# Patient Record
Sex: Male | Born: 1955 | Race: White | Hispanic: No | Smoking: Former smoker
Health system: Southern US, Community
[De-identification: ages and names within clinical notes are randomized; demographics above are authoritative.]

## PROBLEM LIST (undated history)

## (undated) DIAGNOSIS — Z87442 Personal history of urinary calculi: Secondary | ICD-10-CM

## (undated) DIAGNOSIS — R220 Localized swelling, mass and lump, head: Secondary | ICD-10-CM

## (undated) DIAGNOSIS — E785 Hyperlipidemia, unspecified: Secondary | ICD-10-CM

## (undated) DIAGNOSIS — C099 Malignant neoplasm of tonsil, unspecified: Secondary | ICD-10-CM

## (undated) DIAGNOSIS — I10 Essential (primary) hypertension: Secondary | ICD-10-CM

## (undated) DIAGNOSIS — E119 Type 2 diabetes mellitus without complications: Secondary | ICD-10-CM

## (undated) DIAGNOSIS — N529 Male erectile dysfunction, unspecified: Secondary | ICD-10-CM

## (undated) DIAGNOSIS — E669 Obesity, unspecified: Secondary | ICD-10-CM

## (undated) DIAGNOSIS — K219 Gastro-esophageal reflux disease without esophagitis: Secondary | ICD-10-CM

## (undated) DIAGNOSIS — N200 Calculus of kidney: Secondary | ICD-10-CM

## (undated) HISTORY — DX: Obesity, unspecified: E66.9

## (undated) HISTORY — DX: Male erectile dysfunction, unspecified: N52.9

## (undated) HISTORY — DX: Gastro-esophageal reflux disease without esophagitis: K21.9

## (undated) HISTORY — DX: Localized swelling, mass and lump, head: R22.0

## (undated) HISTORY — DX: Essential (primary) hypertension: I10

## (undated) HISTORY — DX: Personal history of urinary calculi: Z87.442

## (undated) HISTORY — DX: Type 2 diabetes mellitus without complications: E11.9

## (undated) HISTORY — DX: Calculus of kidney: N20.0

## (undated) HISTORY — DX: Hyperlipidemia, unspecified: E78.5

---
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
## 2005-02-09 HISTORY — PX: CHOLECYSTECTOMY: SHX55

## 2005-02-10 ENCOUNTER — Emergency Department (HOSPITAL_COMMUNITY): Admission: EM | Admit: 2005-02-10 | Discharge: 2005-02-10 | Payer: Self-pay | Admitting: Emergency Medicine

## 2008-11-09 HISTORY — PX: COLONOSCOPY: SHX174

## 2008-11-21 ENCOUNTER — Ambulatory Visit: Payer: Self-pay | Admitting: Gastroenterology

## 2008-11-28 ENCOUNTER — Ambulatory Visit: Payer: Self-pay | Admitting: Gastroenterology

## 2008-11-28 ENCOUNTER — Encounter: Payer: Self-pay | Admitting: Gastroenterology

## 2008-11-30 ENCOUNTER — Encounter: Payer: Self-pay | Admitting: Gastroenterology

## 2009-01-18 ENCOUNTER — Emergency Department: Payer: Self-pay | Admitting: Emergency Medicine

## 2009-01-25 ENCOUNTER — Ambulatory Visit: Payer: Self-pay | Admitting: Urology

## 2009-01-29 ENCOUNTER — Ambulatory Visit: Payer: Self-pay | Admitting: Urology

## 2009-01-30 ENCOUNTER — Ambulatory Visit: Payer: Self-pay | Admitting: Urology

## 2009-01-31 ENCOUNTER — Ambulatory Visit: Payer: Self-pay | Admitting: Urology

## 2009-02-14 ENCOUNTER — Ambulatory Visit: Payer: Self-pay | Admitting: Urology

## 2011-02-10 DIAGNOSIS — Z87442 Personal history of urinary calculi: Secondary | ICD-10-CM

## 2011-02-10 HISTORY — PX: LITHOTRIPSY: SUR834

## 2011-02-10 HISTORY — DX: Personal history of urinary calculi: Z87.442

## 2011-11-25 ENCOUNTER — Encounter: Payer: Self-pay | Admitting: Family Medicine

## 2011-11-25 ENCOUNTER — Ambulatory Visit (INDEPENDENT_AMBULATORY_CARE_PROVIDER_SITE_OTHER): Payer: BC Managed Care – PPO | Admitting: Family Medicine

## 2011-11-25 VITALS — BP 130/84 | HR 76 | Temp 98.2°F | Ht 71.5 in | Wt 253.5 lb

## 2011-11-25 DIAGNOSIS — E669 Obesity, unspecified: Secondary | ICD-10-CM

## 2011-11-25 DIAGNOSIS — M722 Plantar fascial fibromatosis: Secondary | ICD-10-CM

## 2011-11-25 DIAGNOSIS — E66811 Obesity, class 1: Secondary | ICD-10-CM | POA: Insufficient documentation

## 2011-11-25 DIAGNOSIS — I10 Essential (primary) hypertension: Secondary | ICD-10-CM

## 2011-11-25 DIAGNOSIS — Z23 Encounter for immunization: Secondary | ICD-10-CM

## 2011-11-25 DIAGNOSIS — L821 Other seborrheic keratosis: Secondary | ICD-10-CM

## 2011-11-25 DIAGNOSIS — Z87442 Personal history of urinary calculi: Secondary | ICD-10-CM

## 2011-11-25 DIAGNOSIS — E785 Hyperlipidemia, unspecified: Secondary | ICD-10-CM

## 2011-11-25 DIAGNOSIS — K219 Gastro-esophageal reflux disease without esophagitis: Secondary | ICD-10-CM

## 2011-11-25 DIAGNOSIS — N529 Male erectile dysfunction, unspecified: Secondary | ICD-10-CM

## 2011-11-25 NOTE — Assessment & Plan Note (Signed)
Reasurred. Monitor for now.

## 2011-11-25 NOTE — Addendum Note (Signed)
Addended by: Josph Macho A on: 11/25/2011 10:28 AM   Modules accepted: Orders

## 2011-11-25 NOTE — Assessment & Plan Note (Addendum)
Check FLP tomorrow - off vytorin.   Will call with plan for chol meds when review upcoming records. Discussed healthier diet for improving chol levels.

## 2011-11-25 NOTE — Progress Notes (Signed)
Subjective:    Patient ID: William Johns, male    DOB: [name] 14, 1956, 56 y.o.   MRN: [id number]  HPI CC: new pt to establish  Has not been seen by prior PCP in 2 yrs.  Would like mole on right abdomen evaluated. Right leg with wart wants evaluated.  Wonders about plantar fasciitis - going on for 1.5 yrs.  Seems to be getting better.  Worse pain with first steps in mornings.  Has had cortisone injection into heel.  Didn't help.  Has not treated with medicines.  Discussed treatment options.  Urologist is Dr. Landis Gandy - [facility] who follows him for kidney stones and ED.  H/o kidney stones - is on hydrocodone for this.  Rarely uses.  HTN - stable and compliant with meds. HLD - chronic.  Was on vytorin but off for 2 months because ran out of meds. Also on fenofibrate 160mg  daily.  Not fasting today.  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  Last blood work was ~2 yrs ago. Colonoscopy 2 yrs ago, states normal . Done by Corinda Gubler Christella Hartigan) 11/2008 - 2 polyps and mild diverticulosis, rec rpt 10 yrs. Prostate exam - none recent.  Discussed - prefers to hold off on screening currently. Flu shot today 11/2011.  Medications and allergies reviewed and updated in chart.  Past histories reviewed and updated if relevant as below. There is no problem list on file for this patient.  Past Medical History  Diagnosis Date  . HTN (hypertension)   . HLD (hyperlipidemia)   . History of kidney stones 2013    s/p lithotripsy  . ED (erectile dysfunction)   . Obesity   . GERD (gastroesophageal reflux disease)    Past Surgical History  Procedure Date  . Cholecystectomy 2007  . Lithotripsy 2013    x 2   History  Substance Use Topics  . Smoking status: Former Smoker    Quit date:  02/10/2007  . Smokeless tobacco: Never Used  . Alcohol Use: Yes     5 drinks/week   Family History  Problem Relation Age of Onset  . Cancer Father 67    lung, smoker  . Aneurysm Mother 48    brain  . Coronary artery disease Neg Hx   . Stroke Neg Hx   . Diabetes Neg Hx    Allergies  Allergen Reactions  . Oxycodone Nausea And Vomiting   Current Outpatient Prescriptions on File Prior to Visit  Medication Sig Dispense Refill  . fenofibrate 160 MG tablet Take 160 mg by mouth daily.      . metoprolol succinate (TOPROL-XL) 50 MG 24 hr tablet Take 25 mg by mouth daily. Take with or immediately following a meal.      . omeprazole (PRILOSEC OTC) 20 MG tablet Take 20 mg by mouth daily.      . sildenafil (VIAGRA) 100 MG tablet Take 50 mg by mouth daily as needed.      . ezetimibe-simvastatin (VYTORIN) 10-20 MG per tablet Take 1 tablet by mouth at bedtime.         Review of Systems  Constitutional: Negative for fever, chills, activity change, appetite change, fatigue and unexpected weight change.  HENT: Negative for hearing loss and neck pain.   Eyes: Negative for visual disturbance.  Respiratory: Negative  for cough, chest tightness, shortness of breath and wheezing.   Cardiovascular: Negative for chest pain (occasional "twinges" at night time), palpitations and leg swelling.  Gastrointestinal: Negative for nausea, vomiting, abdominal pain, diarrhea, constipation, blood in stool and abdominal distention.  Genitourinary: Negative for hematuria and difficulty urinating.  Musculoskeletal: Negative for myalgias and arthralgias.  Skin: Negative for rash.  Neurological: Negative for dizziness, seizures, syncope and headaches.  Hematological: Does not bruise/bleed easily.  Psychiatric/Behavioral: Negative for dysphoric mood. The patient is not nervous/anxious.        Objective:   Physical Exam  Nursing note and vitals reviewed. Constitutional: He is oriented to person, place, and time. He  appears well-developed and well-nourished. No distress.  HENT:  Head: Normocephalic and atraumatic.  Right Ear: Hearing, tympanic membrane, external ear and ear canal normal.  Left Ear: Hearing, tympanic membrane, external ear and ear canal normal.  Nose: Nose normal.  Mouth/Throat: Oropharynx is clear and moist. No oropharyngeal exudate.  Eyes: Conjunctivae normal and EOM are normal. Pupils are equal, round, and reactive to light. No scleral icterus.  Neck: Normal range of motion. Neck supple. No thyromegaly present.  Cardiovascular: Normal rate, regular rhythm, normal heart sounds and intact distal pulses.   No murmur heard. Pulses:      Radial pulses are 2+ on the right side, and 2+ on the left side.  Pulmonary/Chest: Effort normal and breath sounds normal. No respiratory distress. He has no wheezes. He has no rales.  Abdominal: Soft. Bowel sounds are normal. He exhibits no distension and no mass. There is no tenderness. There is no rebound and no guarding.  Musculoskeletal: Normal range of motion. He exhibits no edema.  Lymphadenopathy:    He has no cervical adenopathy.  Neurological: He is alert and oriented to person, place, and time.       CN grossly intact, station and gait intact  Skin: Skin is warm and dry. No rash noted.       Multiple SKs throughout body  Psychiatric: He has a normal mood and affect. His behavior is normal. Judgment and thought content normal.       Assessment & Plan:

## 2011-11-25 NOTE — Assessment & Plan Note (Signed)
Encouraged continued activity to achieve weight loss. Body mass index is 34.86 kg/(m^2).

## 2011-11-25 NOTE — Assessment & Plan Note (Signed)
Chronic, stable. continue toprol xl.

## 2011-11-25 NOTE — Assessment & Plan Note (Signed)
Story consistent with this. Recommended stretching exercises. Heel cushion, frozen water bottle massage. Update if not continuing to improve

## 2011-11-25 NOTE — Patient Instructions (Addendum)
Flu shot today. Tdap today (tetanus and pertussis). Sounds like plantar fasciitis - do stretching exercises provided today. Return in 1 year for physical or as needed. Return tomorrow for blood work. Hold off on vytorin for now - we will see how blood work is tomorrow and give you a call with results.

## 2011-11-25 NOTE — Assessment & Plan Note (Signed)
Chronic. Continue OTC prilosec.

## 2011-11-26 ENCOUNTER — Other Ambulatory Visit (INDEPENDENT_AMBULATORY_CARE_PROVIDER_SITE_OTHER): Payer: BC Managed Care – PPO

## 2011-11-26 DIAGNOSIS — E785 Hyperlipidemia, unspecified: Secondary | ICD-10-CM

## 2011-11-26 DIAGNOSIS — I10 Essential (primary) hypertension: Secondary | ICD-10-CM

## 2011-11-26 LAB — COMPREHENSIVE METABOLIC PANEL
Albumin: 3.9 g/dL (ref 3.5–5.2)
BUN: 20 mg/dL (ref 6–23)
Calcium: 9.7 mg/dL (ref 8.4–10.5)
Chloride: 108 mEq/L (ref 96–112)
Potassium: 4.1 mEq/L (ref 3.5–5.1)
Total Bilirubin: 0.9 mg/dL (ref 0.3–1.2)
Total Protein: 7.2 g/dL (ref 6.0–8.3)

## 2011-11-26 LAB — LIPID PANEL
Cholesterol: 212 mg/dL — ABNORMAL HIGH (ref 0–200)
HDL: 26.1 mg/dL — ABNORMAL LOW (ref >39.00)
Total CHOL/HDL Ratio: 8

## 2011-11-26 LAB — LDL CHOLESTEROL, DIRECT: Direct LDL: 160.6 mg/dL

## 2011-11-26 LAB — TSH: TSH: 1.24 u[IU]/mL (ref 0.35–5.50)

## 2011-11-27 ENCOUNTER — Other Ambulatory Visit: Payer: BC Managed Care – PPO

## 2011-11-29 ENCOUNTER — Other Ambulatory Visit: Payer: Self-pay | Admitting: Family Medicine

## 2011-11-29 DIAGNOSIS — R739 Hyperglycemia, unspecified: Secondary | ICD-10-CM

## 2011-11-29 MED ORDER — ATORVASTATIN CALCIUM 40 MG PO TABS: 40.0000 mg | ORAL_TABLET | Freq: Every day | ORAL | Status: DC

## 2011-12-09 ENCOUNTER — Encounter: Payer: Self-pay | Admitting: Family Medicine

## 2011-12-09 ENCOUNTER — Other Ambulatory Visit (INDEPENDENT_AMBULATORY_CARE_PROVIDER_SITE_OTHER): Payer: BC Managed Care – PPO

## 2011-12-09 DIAGNOSIS — R7309 Other abnormal glucose: Secondary | ICD-10-CM

## 2011-12-09 DIAGNOSIS — E119 Type 2 diabetes mellitus without complications: Secondary | ICD-10-CM

## 2011-12-09 DIAGNOSIS — R7303 Prediabetes: Secondary | ICD-10-CM | POA: Insufficient documentation

## 2011-12-09 DIAGNOSIS — Z8639 Personal history of other endocrine, nutritional and metabolic disease: Secondary | ICD-10-CM | POA: Insufficient documentation

## 2011-12-09 DIAGNOSIS — R739 Hyperglycemia, unspecified: Secondary | ICD-10-CM

## 2011-12-09 DIAGNOSIS — E1169 Type 2 diabetes mellitus with other specified complication: Secondary | ICD-10-CM | POA: Insufficient documentation

## 2011-12-09 LAB — HEMOGLOBIN A1C: Hgb A1c MFr Bld: 6.8 % — ABNORMAL HIGH (ref 4.6–6.5)

## 2011-12-09 LAB — BASIC METABOLIC PANEL
Chloride: 105 mEq/L (ref 96–112)
GFR: 64.63 mL/min (ref >60.00)

## 2011-12-22 ENCOUNTER — Encounter: Payer: Self-pay | Admitting: Family Medicine

## 2011-12-22 ENCOUNTER — Ambulatory Visit (INDEPENDENT_AMBULATORY_CARE_PROVIDER_SITE_OTHER): Payer: BC Managed Care – PPO | Admitting: Family Medicine

## 2011-12-22 VITALS — BP 112/76 | HR 60 | Temp 98.1°F | Wt 250.2 lb

## 2011-12-22 DIAGNOSIS — I10 Essential (primary) hypertension: Secondary | ICD-10-CM

## 2011-12-22 DIAGNOSIS — E119 Type 2 diabetes mellitus without complications: Secondary | ICD-10-CM

## 2011-12-22 LAB — MICROALBUMIN / CREATININE URINE RATIO: Microalb Creat Ratio: 1.4 mg/g (ref 0.0–30.0)

## 2011-12-22 MED ORDER — METOPROLOL SUCCINATE ER 50 MG PO TB24: 25.0000 mg | ORAL_TABLET | Freq: Every day | ORAL | Status: DC

## 2011-12-22 MED ORDER — FENOFIBRATE 160 MG PO TABS: 160.0000 mg | ORAL_TABLET | Freq: Every day | ORAL | Status: DC

## 2011-12-22 MED ORDER — SILDENAFIL CITRATE 100 MG PO TABS: 50.0000 mg | ORAL_TABLET | Freq: Every day | ORAL | Status: DC | PRN

## 2011-12-22 NOTE — Progress Notes (Signed)
  Subjective:    Patient ID: William Johns, male    DOB: 01-02-1956, 56 y.o.   MRN: [id number]  HPI CC: discuss DM  Would like refills of meds including viagra.  DM - has been prediabetic in past.  Never fully diabetic.    Completely changed diet, lost 3 lbs.  Decreasing starched carbs.  Lots of steamed vegetables and chicken.    Vision exam done every 2 yrs.  Last one was 11/2011. Foot exam today.   Past Medical History  Diagnosis Date  . HTN (hypertension)   . HLD (hyperlipidemia)   . History of kidney stones 2013    s/p lithotripsy  . ED (erectile dysfunction)   . Obesity   . GERD (gastroesophageal reflux disease)   . T2DM (type 2 diabetes mellitus)     Review of Systems Per HPI    Objective:   Physical Exam  Nursing note and vitals reviewed. Constitutional: He appears well-developed and well-nourished. No distress.  HENT:  Head: Normocephalic and atraumatic.  Right Ear: External ear normal.  Left Ear: External ear normal.  Nose: Nose normal.  Mouth/Throat: Oropharynx is clear and moist. No oropharyngeal exudate.  Eyes: Conjunctivae normal and EOM are normal. Pupils are equal, round, and reactive to light. No scleral icterus.  Neck: Normal range of motion. Neck supple.  Cardiovascular: Normal rate, regular rhythm, normal heart sounds and intact distal pulses.   No murmur heard. Pulmonary/Chest: Effort normal and breath sounds normal. No respiratory distress. He has no wheezes. He has no rales.  Musculoskeletal: He exhibits no edema.       Diabetic foot exam: Normal inspection No skin breakdown No calluses  Slightly diminished DP/PT pulses Normal sensation to light tough and monofilament Nails normal  Lymphadenopathy:    He has no cervical adenopathy.  Skin: Skin is warm and dry. No rash noted.  Psychiatric: He has a normal mood and affect.       Assessment & Plan:  \

## 2011-12-22 NOTE — Assessment & Plan Note (Signed)
New dx, discussed with pt. Diet controlled currently. Has changed diet and lost weight. anticipate continued good control. Refer for diabetes education, return in 4 mo for recheck blood work (and chol) and DM f/u. Questions answered.

## 2011-12-22 NOTE — Patient Instructions (Addendum)
Return in 4 months for repeat blood work ot check sugars and cholesterol levels and afterwards for visit. Eye check recommended yearly. Keep up the good work with weight loss and diet changes. Call us with questions. Pass by Marion's office to schedule appointment with diabetes education

## 2011-12-23 ENCOUNTER — Encounter: Payer: Self-pay | Admitting: *Deleted

## 2012-01-28 ENCOUNTER — Encounter: Payer: BC Managed Care – PPO | Attending: Family Medicine | Admitting: *Deleted

## 2012-01-28 ENCOUNTER — Encounter: Payer: Self-pay | Admitting: *Deleted

## 2012-01-28 VITALS — Ht 71.0 in | Wt 243.8 lb

## 2012-01-28 DIAGNOSIS — Z713 Dietary counseling and surveillance: Secondary | ICD-10-CM | POA: Insufficient documentation

## 2012-01-28 DIAGNOSIS — E119 Type 2 diabetes mellitus without complications: Secondary | ICD-10-CM | POA: Insufficient documentation

## 2012-01-28 NOTE — Progress Notes (Signed)
HbA1c  6.8%   Patient was seen on 01/28/12 for the first of a series of three diabetes self-management courses at the [facility]. The following learning objectives were met by the patient during this course:   Defines the role of glucose and insulin  Identifies type of diabetes and pathophysiology  Defines the diagnostic criteria for diabetes and prediabetes  States the risk factors for Type 2 Diabetes  States the symptoms of Type 2 Diabetes  Defines Type 2 Diabetes treatment goals  Defines Type 2 Diabetes treatment options  States the rationale for glucose monitoring  Identifies A1C, glucose targets, and testing times  Identifies proper sharps disposal  Defines the purpose of a diabetes food plan  Identifies carbohydrate food groups  Defines effects of carbohydrate foods on glucose levels  Identifies carbohydrate choices/grams/food labels  States benefits of physical activity and effect on glucose  Review of suggested activity guidelines  Handouts given during class include:  Type 2 Diabetes: Basics Book  My Food Plan Book  Follow-Up Plan: Attend core 2 and 3

## 2012-01-28 NOTE — Patient Instructions (Signed)
Goals:  Follow Diabetes Meal Plan as instructed  Eat 3 meals and 2 snacks, every 3-5 hrs  Limit carbohydrate intake to 45-60 grams carbohydrate/meal  Limit carbohydrate intake to 30 grams carbohydrate/snack  Add lean protein foods to meals/snacks  Monitor glucose levels as instructed by your doctor  Aim for 30 mins of physical activity daily  Bring food record and glucose log to your next nutrition visit   

## 2012-02-18 ENCOUNTER — Encounter: Payer: BC Managed Care – PPO | Attending: Family Medicine | Admitting: Dietician

## 2012-02-18 DIAGNOSIS — E119 Type 2 diabetes mellitus without complications: Secondary | ICD-10-CM | POA: Insufficient documentation

## 2012-02-18 DIAGNOSIS — Z713 Dietary counseling and surveillance: Secondary | ICD-10-CM | POA: Insufficient documentation

## 2012-02-18 NOTE — Progress Notes (Signed)
  Patient was seen on 02/18/2012 for the second of a series of three diabetes self-management courses at the [facility]. The following learning objectives were met by the patient during this course:   Explain basic nutrition maintenance and quality assurance  Describe causes, symptoms and treatment of hypoglycemia and hyperglycemia  Explain how to manage diabetes during illness  Describe the importance of good nutrition for health and healthy eating strategies  List strategies to follow meal plan when dining out  Describe the effects of alcohol on glucose and how to use it safely  Describe problem solving skills for day-to-day glucose challenges  Describe strategies to use when treatment plan needs to change  Identify important factors involved in successful weight loss  Describe ways to remain physically active  Describe the impact of regular activity on insulin resistance  RHandouts given in class:  Refrigerator magnet for Sick Day Guidelines  [facility] Oral medication/insulin handout  Weight Loss Handout  Follow-Up Plan: Patient will attend the final class of the ADA Diabetes Self-Care Education.

## 2012-03-03 ENCOUNTER — Encounter: Payer: BC Managed Care – PPO | Admitting: *Deleted

## 2012-03-03 DIAGNOSIS — E119 Type 2 diabetes mellitus without complications: Secondary | ICD-10-CM

## 2012-03-03 NOTE — Progress Notes (Signed)
  Patient was seen on 03/03/12 for the third of a series of three diabetes self-management courses at the [facility]. The following learning objectives were met by the patient during this course:    Describe how diabetes changes over time   Identify diabetes complications and ways to prevent them   Describe strategies that can promote heart health including lowering blood pressure and cholesterol   Describe strategies to lower dietary fat and sodium in the diet   Identify physical activities that benefit cardiovascular health   Evaluate success in meeting personal goal   Describe the belief that they can live successfully with diabetes day to day   Establish 2-3 goals that they will plan to diligently work on until they return for the free 83-month follow-up visit  The following handouts were given in class:  3 Month Follow Up Visit handout  Goal setting handout  Class evaluation form  Your patient has established the following 3 month goals for diabetes self-care:  Reduce fat  Increase physical activity  Follow-Up Plan: Patient will attend a 3 month follow-up visit for diabetes self-management education.

## 2012-03-03 NOTE — Patient Instructions (Signed)
Goals:  Follow Diabetes Meal Plan as instructed  Eat 3 meals and 2 snacks, every 3-5 hrs  Limit carbohydrate intake to 45-60 grams carbohydrate/meal  Limit carbohydrate intake to 30 grams carbohydrate/snack  Add lean protein foods to meals/snacks  Monitor glucose levels as instructed by your doctor  Aim for 30 mins of physical activity daily  Bring food record and glucose log to your next nutrition visit   

## 2012-04-20 ENCOUNTER — Ambulatory Visit (INDEPENDENT_AMBULATORY_CARE_PROVIDER_SITE_OTHER): Payer: BC Managed Care – PPO | Admitting: Family Medicine

## 2012-04-20 ENCOUNTER — Encounter: Payer: Self-pay | Admitting: Family Medicine

## 2012-04-20 VITALS — BP 126/78 | HR 72 | Temp 98.1°F

## 2012-04-20 DIAGNOSIS — I10 Essential (primary) hypertension: Secondary | ICD-10-CM

## 2012-04-20 DIAGNOSIS — E785 Hyperlipidemia, unspecified: Secondary | ICD-10-CM

## 2012-04-20 DIAGNOSIS — E119 Type 2 diabetes mellitus without complications: Secondary | ICD-10-CM

## 2012-04-20 LAB — BASIC METABOLIC PANEL
CO2: 28 mEq/L (ref 19–32)
Calcium: 9.2 mg/dL (ref 8.4–10.5)
Creatinine, Ser: 1.3 mg/dL (ref 0.4–1.5)
GFR: 63.36 mL/min (ref >60.00)
Glucose, Bld: 149 mg/dL — ABNORMAL HIGH (ref 70–99)

## 2012-04-20 LAB — LIPID PANEL: Triglycerides: 103 mg/dL (ref 0.0–149.0)

## 2012-04-20 LAB — HEMOGLOBIN A1C: Hgb A1c MFr Bld: 6.4 % (ref 4.6–6.5)

## 2012-04-20 MED ORDER — ONETOUCH ULTRA SYSTEM W/DEVICE KIT: 1.0000 | PACK | Freq: Once | Status: DC

## 2012-04-20 MED ORDER — GLUCOSE BLOOD VI STRP: ORAL_STRIP | Status: DC

## 2012-04-20 NOTE — Progress Notes (Signed)
  Subjective:    Patient ID: Keyshun Elpers, male    DOB: Dec 21, 1955, 57 y.o.   MRN: [id number]  HPI CC: 4 mo f/u DM  DM - diet controlled.  Recently diagnosed.  Completed DSME at [facility].  Fasting today for blood work.  Watching diet.  Has cut out sodas, cut back on bread, increased steamed vegetables.  Walking, gym twice a week. Lab Results  Component Value Date   HGBA1C 6.8* 12/09/2011   Wt Readings from Last 3 Encounters:  01/28/12 243 lb 12.8 oz (110.587 kg)  12/22/11 250 lb 4 oz (113.513 kg)  11/25/11 253 lb 8 oz (114.987 kg)  scale not working today.  HTN - compliant with toprol xl.  No vision changes, CP/tightness, SOB, leg swelling. Occasional headache. Occasional muscle spasm R chest wall. BP Readings from Last 3 Encounters:  04/20/12 126/78  12/22/11 112/76  11/25/11 130/84    HLD - compliant with lipitor and fenofibrate.  No myalgias.  FLP today.  Past Medical History  Diagnosis Date  . HTN (hypertension)   . HLD (hyperlipidemia)   . History of kidney stones 2013    s/p lithotripsy  . ED (erectile dysfunction)   . Obesity   . GERD (gastroesophageal reflux disease)   . T2DM (type 2 diabetes mellitus)     DSME at [facility] 01/2012     Review of Systems Per HPI    Objective:   Physical Exam  Nursing note and vitals reviewed. Constitutional: He appears well-developed and well-nourished. No distress.  HENT:  Head: Normocephalic and atraumatic.  Mouth/Throat: Oropharynx is clear and moist. No oropharyngeal exudate.  Neck: Normal range of motion. Neck supple. Carotid bruit is not present.  Cardiovascular: Normal rate, regular rhythm, normal heart sounds and intact distal pulses.   No murmur heard. Pulmonary/Chest: Effort normal and breath sounds normal. No respiratory distress. He has no wheezes. He has no rales.  Musculoskeletal: He exhibits no edema.  Lymphadenopathy:    He has no cervical adenopathy.       Assessment & Plan:

## 2012-04-20 NOTE — Assessment & Plan Note (Signed)
Chronic, stable. Good control. Continue med.

## 2012-04-20 NOTE — Patient Instructions (Signed)
Blood work today. Return as needed or in 6 months for follow up. Glucose meter and test strip prescription sent in today - check as needed. Keep up good work with diet changes and exercise. Goal exercise is 30-27min 3-4 times weekly.

## 2012-04-20 NOTE — Assessment & Plan Note (Signed)
Recent diagnosis. Diet controlled as of last check. Discussed healthy diet and exercise. Has completed diabetes education. Prescribed glucose meter today and strips, rec check when feeling ill or as needed.

## 2012-04-20 NOTE — Assessment & Plan Note (Signed)
Chronic. Check FLP today.  

## 2012-04-21 ENCOUNTER — Encounter: Payer: Self-pay | Admitting: *Deleted

## 2012-05-25 ENCOUNTER — Ambulatory Visit: Payer: BC Managed Care – PPO | Admitting: Family Medicine

## 2012-06-30 ENCOUNTER — Ambulatory Visit: Payer: BC Managed Care – PPO | Admitting: *Deleted

## 2012-08-18 ENCOUNTER — Other Ambulatory Visit: Payer: Self-pay

## 2012-11-11 ENCOUNTER — Encounter: Payer: Self-pay | Admitting: Family Medicine

## 2012-11-11 ENCOUNTER — Ambulatory Visit (INDEPENDENT_AMBULATORY_CARE_PROVIDER_SITE_OTHER): Payer: BC Managed Care – PPO | Admitting: Family Medicine

## 2012-11-11 VITALS — BP 124/84 | HR 60 | Temp 98.1°F | Ht 71.0 in | Wt 249.0 lb

## 2012-11-11 DIAGNOSIS — E785 Hyperlipidemia, unspecified: Secondary | ICD-10-CM

## 2012-11-11 DIAGNOSIS — L259 Unspecified contact dermatitis, unspecified cause: Secondary | ICD-10-CM

## 2012-11-11 DIAGNOSIS — Z23 Encounter for immunization: Secondary | ICD-10-CM

## 2012-11-11 DIAGNOSIS — L309 Dermatitis, unspecified: Secondary | ICD-10-CM | POA: Insufficient documentation

## 2012-11-11 DIAGNOSIS — E119 Type 2 diabetes mellitus without complications: Secondary | ICD-10-CM

## 2012-11-11 DIAGNOSIS — I10 Essential (primary) hypertension: Secondary | ICD-10-CM

## 2012-11-11 LAB — LIPID PANEL
Cholesterol: 153 mg/dL (ref 0–200)
Triglycerides: 138 mg/dL (ref <150)
VLDL: 28 mg/dL (ref 0–40)

## 2012-11-11 LAB — BASIC METABOLIC PANEL
Calcium: 9.8 mg/dL (ref 8.4–10.5)
Sodium: 142 mEq/L (ref 135–145)

## 2012-11-11 MED ORDER — CLOBETASOL PROPIONATE 0.05 % EX CREA: TOPICAL_CREAM | Freq: Two times a day (BID) | CUTANEOUS | Status: DC

## 2012-11-11 MED ORDER — LISINOPRIL 5 MG PO TABS: 5.0000 mg | ORAL_TABLET | Freq: Every day | ORAL | Status: DC

## 2012-11-11 NOTE — Assessment & Plan Note (Signed)
Chronic. Check FLP today. Continue lipitor and tricor.

## 2012-11-11 NOTE — Assessment & Plan Note (Signed)
Chronic, stable. Change from toprol xl to lisinopril - discussed taper as per instructions.

## 2012-11-11 NOTE — Addendum Note (Signed)
Addended by: Cindee Lame R on: 11/11/2012 03:47 PM   Modules accepted: Orders

## 2012-11-11 NOTE — Progress Notes (Signed)
  Subjective:    Patient ID: Calub Tarnow, male    DOB: 1955/07/31, 57 y.o.   MRN: [id number]  HPI CC: 6 mo f/u  Mr. Chrisley presents today for 6 mo f/u.  H/o kidney stones - currently battling one.  Requests hydrocodone. Eczema - prior on TCI ointment.  Requests refill of this.  HTN - compliant with metoprolol xl 1/2 tablet daily.  No vision changes, CP/tightness, leg swelling.  Mild dyspnea occasionally.  Mild headaches occasionally  HLD - compliant with lipitor and tricor.  DM - does not check sugars.  Always diet controlled.  Occasional paresthesias.  Pneumovax today.  Foot exam today.  Last eye exam 1 year ago.  No hypoglycemic sxs. Lab Results  Component Value Date   HGBA1C 6.4 04/20/2012   Wt Readings from Last 3 Encounters:  11/11/12 249 lb (112.946 kg)  01/28/12 243 lb 12.8 oz (110.587 kg)  12/22/11 250 lb 4 oz (113.513 kg)  Body mass index is 34.74 kg/(m^2).  Past Medical History  Diagnosis Date  . HTN (hypertension)   . HLD (hyperlipidemia)   . History of kidney stones 2013    s/p lithotripsy  . ED (erectile dysfunction)   . Obesity   . GERD (gastroesophageal reflux disease)   . T2DM (type 2 diabetes mellitus)     DSME at [facility] 01/2012     Review of Systems Per HPI    Objective:   Physical Exam  Nursing note and vitals reviewed. Constitutional: He appears well-developed and well-nourished. No distress.  HENT:  Head: Normocephalic and atraumatic.  Right Ear: External ear normal.  Left Ear: External ear normal.  Nose: Nose normal.  Mouth/Throat: Oropharynx is clear and moist. No oropharyngeal exudate.  Eyes: Conjunctivae and EOM are normal. Pupils are equal, round, and reactive to light. No scleral icterus.  Neck: Normal range of motion. Neck supple.  Cardiovascular: Normal rate, regular rhythm, normal heart sounds and intact distal pulses.   No murmur heard. Pulmonary/Chest: Effort normal and breath sounds normal. No respiratory distress. He has no wheezes.  He has no rales.  Musculoskeletal: He exhibits no edema.  Diabetic foot exam: Normal inspection No skin breakdown calluses great toes Slightly diminished DP/PT pulses Normal sensation to light touch and monofilament Nails normal  Lymphadenopathy:    He has no cervical adenopathy.  Skin: Skin is warm and dry. No rash noted.  Psychiatric: He has a normal mood and affect.       Assessment & Plan:

## 2012-11-11 NOTE — Patient Instructions (Addendum)
Flu shot today. Pneumonia shot today. I've sent in clobetasol cream for eczema.  May use oatmeal bath as well. continue metoprolol xl 1/2 tablet daily until you have about 1 wk's worth, then start taking 1/4 tablet daily (with lisinopril) until you run out.  At that time, start lisinopril 5mg  daily. Blood work today. Return to see me in 6 months for physical or as needed

## 2012-11-11 NOTE — Assessment & Plan Note (Signed)
Diet controlled in past - recheck A1c today.

## 2012-11-11 NOTE — Assessment & Plan Note (Signed)
Treat with high potency steroid cream.  Steroid holiday discussed. rec oatmeal bath prn.

## 2012-11-12 LAB — MICROALBUMIN / CREATININE URINE RATIO: Creatinine, Urine: 166.5 mg/dL

## 2012-12-18 ENCOUNTER — Other Ambulatory Visit: Payer: Self-pay | Admitting: Family Medicine

## 2013-01-21 ENCOUNTER — Other Ambulatory Visit: Payer: Self-pay | Admitting: Family Medicine

## 2013-01-28 ENCOUNTER — Other Ambulatory Visit: Payer: Self-pay | Admitting: Family Medicine

## 2013-03-10 LAB — HM DIABETES EYE EXAM

## 2013-03-22 ENCOUNTER — Encounter: Payer: Self-pay | Admitting: Family Medicine

## 2013-05-12 ENCOUNTER — Encounter: Payer: BC Managed Care – PPO | Admitting: Family Medicine

## 2013-05-15 ENCOUNTER — Encounter: Payer: Self-pay | Admitting: Family Medicine

## 2013-05-15 ENCOUNTER — Ambulatory Visit (INDEPENDENT_AMBULATORY_CARE_PROVIDER_SITE_OTHER): Payer: BC Managed Care – PPO | Admitting: Family Medicine

## 2013-05-15 VITALS — BP 124/70 | HR 70 | Temp 98.2°F | Ht 70.25 in | Wt 251.2 lb

## 2013-05-15 DIAGNOSIS — Z0001 Encounter for general adult medical examination with abnormal findings: Secondary | ICD-10-CM | POA: Insufficient documentation

## 2013-05-15 DIAGNOSIS — I10 Essential (primary) hypertension: Secondary | ICD-10-CM

## 2013-05-15 DIAGNOSIS — E785 Hyperlipidemia, unspecified: Secondary | ICD-10-CM

## 2013-05-15 DIAGNOSIS — E669 Obesity, unspecified: Secondary | ICD-10-CM

## 2013-05-15 DIAGNOSIS — Z0000 Encounter for general adult medical examination without abnormal findings: Secondary | ICD-10-CM | POA: Insufficient documentation

## 2013-05-15 DIAGNOSIS — E119 Type 2 diabetes mellitus without complications: Secondary | ICD-10-CM

## 2013-05-15 DIAGNOSIS — Z87442 Personal history of urinary calculi: Secondary | ICD-10-CM

## 2013-05-15 LAB — LIPID PANEL
Cholesterol: 145 mg/dL (ref 0–200)
HDL: 30.1 mg/dL — ABNORMAL LOW (ref >39.00)
LDL Cholesterol: 91 mg/dL (ref 0–99)
Total CHOL/HDL Ratio: 5
Triglycerides: 118 mg/dL (ref 0.0–149.0)
VLDL: 23.6 mg/dL (ref 0.0–40.0)

## 2013-05-15 LAB — BASIC METABOLIC PANEL
BUN: 13 mg/dL (ref 6–23)
CO2: 26 mEq/L (ref 19–32)
Calcium: 9.1 mg/dL (ref 8.4–10.5)
Chloride: 106 mEq/L (ref 96–112)
Creatinine, Ser: 1.1 mg/dL (ref 0.4–1.5)
GFR: 70.92 mL/min (ref >60.00)
Glucose, Bld: 134 mg/dL — ABNORMAL HIGH (ref 70–99)
POTASSIUM: 4.2 meq/L (ref 3.5–5.1)
SODIUM: 141 meq/L (ref 135–145)

## 2013-05-15 LAB — TSH: TSH: 0.99 u[IU]/mL (ref 0.35–5.50)

## 2013-05-15 LAB — HEMOGLOBIN A1C: Hgb A1c MFr Bld: 6.8 % — ABNORMAL HIGH (ref 4.6–6.5)

## 2013-05-15 MED ORDER — HYDROCODONE-ACETAMINOPHEN 5-325 MG PO TABS: 1.0000 | ORAL_TABLET | Freq: Four times a day (QID) | ORAL | Status: DC | PRN

## 2013-05-15 NOTE — Patient Instructions (Signed)
Blood work today. Good to see you today, call us with questions. Return as needed or in 6 months for recheck.

## 2013-05-15 NOTE — Assessment & Plan Note (Signed)
Preventative protocols reviewed and updated unless pt declined. Discussed healthy diet and lifestyle.  

## 2013-05-15 NOTE — Assessment & Plan Note (Signed)
Chronic, stable. Continue tricor and lipitor.

## 2013-05-15 NOTE — Assessment & Plan Note (Addendum)
Requests pain med prn kidney stone.  Current stone, if does not pass will f/u with urology in [place]. Will prescribe vicodin with sedation precautions.

## 2013-05-15 NOTE — Progress Notes (Signed)
Pre visit review using our clinic review tool, if applicable. No additional management support is needed unless otherwise documented below in the visit note. 

## 2013-05-15 NOTE — Assessment & Plan Note (Signed)
Chronic, stable.  Actually prediabetes last visit. Recheck A1c today.  Continue to encourage compliance with low carb/sugar diet.

## 2013-05-15 NOTE — Assessment & Plan Note (Signed)
Chronic, stable. Continue low dose lisinopril 5mg 

## 2013-05-15 NOTE — Progress Notes (Signed)
BP 124/70  Pulse 70  Temp(Src) 98.2 F (36.8 C) (Oral)  Ht 5' 10.25" (1.784 m)  Wt 251 lb 4 oz (113.966 kg)  BMI 35.81 kg/m2  SpO2 97%   CC: annual exam  Subjective:    Patient ID: William Johns, male    DOB: Sep 16, 1955, 58 y.o.   MRN: [id number]  HPI: William Johns is a 58 y.o. male presenting on 05/15/2013 for Annual Exam   DM - stopped all sodas.  Only drinks occasional diet sodas. Lab Results  Component Value Date   HGBA1C 6.4* 11/11/2012    Wt Readings from Last 3 Encounters:  05/15/13 251 lb 4 oz (113.966 kg)  11/11/12 249 lb (112.946 kg)  01/28/12 243 lb 12.8 oz (110.587 kg)   Body mass index is 35.81 kg/(m^2).  Kidney stone noted for last several months - saw blood in urine weeks ago.  Feeling some R sided pain.  Requests pain med for this.  Urologist in [place] - wants to continue seeing him.  Preventative: Colon cancer screening - 11/2008 2 hyperplastic polyps rec rpt 10 yrs [name]) Prostate cancer screening - discussed - prefers to hold off on screening currently.  No prostate cancer hx.  Nocturia x1-2.  No hesitancy, dribbling or weak stream.  No lower back pain. 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  Allergies and medications reviewed and updated. Current Outpatient Prescriptions on File Prior to Visit  Medication Sig  . aspirin 81 MG tablet Take 81 mg by mouth daily.  Marland Kitchen atorvastatin (LIPITOR) 40 MG tablet TAKE 1 TABLET (40 MG TOTAL) BY MOUTH DAILY.  Marland Kitchen Blood Glucose Monitoring Suppl (ONE TOUCH ULTRA SYSTEM KIT) W/DEVICE KIT 1 kit by Does not apply route once.  . clobetasol cream (TEMOVATE) 0.05 % Apply topically 2 (two) times daily. Apply to  AA  . fenofibrate 160 MG tablet TAKE 1 TABLET BY MOUTH DAILY  . glucose blood (ONE TOUCH ULTRA TEST) test strip Check daily as needed, 250.00  . lisinopril (PRINIVIL,ZESTRIL) 5 MG tablet Take 1 tablet (5 mg total) by mouth daily.  . Multiple Vitamin (MULTIVITAMIN) tablet Take 1 tablet by mouth daily.  Marland Kitchen omeprazole (PRILOSEC OTC) 20 MG tablet Take 20 mg by mouth daily.  . sildenafil (VIAGRA) 100 MG tablet Take 0.5-1 tablets (50-100 mg total) by mouth as needed for erectile dysfunction.   No current facility-administered medications on file prior to visit.    Review of Systems  Constitutional: Positive for fever. Negative for chills, activity change, appetite change, fatigue and unexpected weight change.  HENT: Positive for congestion. Negative for hearing loss.   Eyes: Negative for visual disturbance.  Respiratory: Positive for cough (recent viral uri). Negative for chest tightness, shortness of breath and wheezing.   Cardiovascular: Negative for chest pain, palpitations and leg swelling.  Gastrointestinal: Negative for nausea, vomiting, abdominal pain, diarrhea, constipation, blood in stool and abdominal distention.  Genitourinary: Positive for hematuria. Negative for difficulty urinating.  Musculoskeletal: Negative for arthralgias, myalgias and neck pain.  Skin: Negative for rash.  Neurological: Positive for headaches. Negative for dizziness, seizures and syncope.  Hematological: Negative for adenopathy. Does not bruise/bleed easily.  Psychiatric/Behavioral: Negative for dysphoric mood. The patient is not  nervous/anxious.    Per HPI unless specifically indicated above    Objective:    BP 124/70  Pulse 70  Temp(Src) 98.2 F (36.8 C) (Oral)  Ht 5' 10.25" (1.784 m)  Wt 251 lb 4 oz (113.966 kg)  BMI 35.81 kg/m2  SpO2 97%  Physical Exam  Nursing note and vitals reviewed. Constitutional: He is oriented to person, place, and time. He appears well-developed and well-nourished. No  distress.  HENT:  Head: Normocephalic and atraumatic.  Right Ear: Hearing, tympanic membrane, external ear and ear canal normal.  Left Ear: Hearing, tympanic membrane, external ear and ear canal normal.  Nose: Nose normal.  Mouth/Throat: Uvula is midline, oropharynx is clear and moist and mucous membranes are normal. No oropharyngeal exudate, posterior oropharyngeal edema or posterior oropharyngeal erythema.  Eyes: Conjunctivae and EOM are normal. Pupils are equal, round, and reactive to light. No scleral icterus.  Neck: Normal range of motion. Neck supple. Carotid bruit is not present. No thyromegaly present.  Cardiovascular: Normal rate, regular rhythm, normal heart sounds and intact distal pulses.   No murmur heard. Pulses:      Radial pulses are 2+ on the right side, and 2+ on the left side.  Pulmonary/Chest: Effort normal and breath sounds normal. No respiratory distress. He has no wheezes. He has no rales.  Abdominal: Soft. Bowel sounds are normal. He exhibits no distension and no mass. There is no tenderness. There is no rebound and no guarding.  Musculoskeletal: Normal range of motion. He exhibits no edema.  Diabetic foot exam: Normal inspection No skin breakdown No calluses  Normal DP pulses Normal sensation to light touch and monofilament Nails normal  Lymphadenopathy:    He has no cervical adenopathy.  Neurological: He is alert and oriented to person, place, and time.  CN grossly intact, station and gait intact  Skin: Skin is warm and dry. No rash noted.  Psychiatric: He has a normal mood and affect. His behavior is normal. Judgment and thought content normal.   Results for orders placed in visit on 03/22/13  HM DIABETES EYE EXAM      Result Value Ref Range   HM Diabetic Eye Exam [facility]        Assessment & Plan:   Problem List Items Addressed This Visit   HTN (hypertension)     Chronic, stable. Continue low dose lisinopril 106m     Relevant Orders       Basic metabolic panel      TSH   HLD (hyperlipidemia)     Chronic, stable. Continue tricor and lipitor.    Relevant Orders      Lipid panel   History of kidney stones     Requests pain med prn kidney stone.  Current stone, if does not pass will f/u with urology in [place]. Will prescribe vicodin with sedation precautions.    Obesity     Continue to encourage weight loss through healthy dietary choices and lifestyle.    T2DM (type 2 diabetes mellitus)     Chronic, stable.  Actually prediabetes last visit. Recheck A1c today.  Continue to encourage compliance with low carb/sugar diet.    Relevant Orders      Hemoglobin A1c      HM DIABETES FOOT EXAM (Completed)   Health maintenance examination - Primary     Preventative protocols reviewed and updated unless pt declined. Discussed healthy diet and lifestyle.        Follow up plan: Return  in about 6 months (around 11/14/2013), or as needed, for follow up.

## 2013-05-15 NOTE — Assessment & Plan Note (Signed)
Continue to encourage weight loss through healthy dietary choices and lifestyle.

## 2013-05-16 ENCOUNTER — Telehealth: Payer: Self-pay | Admitting: Family Medicine

## 2013-05-16 NOTE — Telephone Encounter (Signed)
Relevant patient education assigned to patient using Emmi. ° °

## 2013-07-13 ENCOUNTER — Other Ambulatory Visit: Payer: Self-pay | Admitting: Family Medicine

## 2013-11-15 ENCOUNTER — Encounter: Payer: Self-pay | Admitting: Family Medicine

## 2013-11-15 ENCOUNTER — Ambulatory Visit (INDEPENDENT_AMBULATORY_CARE_PROVIDER_SITE_OTHER): Payer: BC Managed Care – PPO | Admitting: Family Medicine

## 2013-11-15 VITALS — BP 132/82 | HR 80 | Temp 98.2°F | Wt 246.8 lb

## 2013-11-15 DIAGNOSIS — Z23 Encounter for immunization: Secondary | ICD-10-CM

## 2013-11-15 DIAGNOSIS — Z87442 Personal history of urinary calculi: Secondary | ICD-10-CM

## 2013-11-15 DIAGNOSIS — L309 Dermatitis, unspecified: Secondary | ICD-10-CM

## 2013-11-15 DIAGNOSIS — E785 Hyperlipidemia, unspecified: Secondary | ICD-10-CM

## 2013-11-15 DIAGNOSIS — E119 Type 2 diabetes mellitus without complications: Secondary | ICD-10-CM

## 2013-11-15 DIAGNOSIS — I10 Essential (primary) hypertension: Secondary | ICD-10-CM

## 2013-11-15 MED ORDER — TRIAMCINOLONE ACETONIDE 0.1 % EX CREA: 1.0000 "application " | TOPICAL_CREAM | Freq: Two times a day (BID) | CUTANEOUS | Status: DC

## 2013-11-15 MED ORDER — METHOCARBAMOL 500 MG PO TABS: 500.0000 mg | ORAL_TABLET | Freq: Three times a day (TID) | ORAL | Status: DC | PRN

## 2013-11-15 NOTE — Progress Notes (Signed)
BP 132/82  Pulse 80  Temp(Src) 98.2 F (36.8 C) (Oral)  Wt 246 lb 12 oz (111.925 kg)   CC: 46mof/u  Subjective:    Patient ID: William Johns male    DOB: 705-02-1955 58y.o.   MRN: [id number] HPI: William Johns a 58y.o. male presenting on 11/15/2013 for Follow-up   DM - regularly does not check sugars.  Compliant with antihyperglycemic regimen which includes: diet controlled.  Denies hypoglycemic symptoms.  Denies paresthesias. Last diabetic eye exam 02/2013.  Pneumovax: 11/2012.  Prevnar: not due. Lab Results  Component Value Date   HGBA1C 6.8* 05/15/2013     HLD - compliant with lipitor and fenofibrate, no myalgias.  HTN - Compliant with current antihypertensive regimen of lisinopril 528mdaily.  Does not check blood pressures at home. At store 120/80. No low blood pressure readings or symptoms of dizziness/syncope. Denies HA, vision changes, CP/tightness, SOB, leg swelling.  Some lower back pain after pulling furniture.  Requests muscle relaxant  Skin rash - dorsal forearms bilaterally comes and goes yearly. Clobetasol cream not effective. Requests different steroid. OTC cortisone works best so far. Does do 1 shower and 1 hot bath every day.  Relevant past medical, surgical, family and social history reviewed and updated as indicated.  Allergies and medications reviewed and updated. Current Outpatient Prescriptions on File Prior to Visit  Medication Sig  . aspirin 81 MG tablet Take 81 mg by mouth daily.  . Marland Kitchentorvastatin (LIPITOR) 40 MG tablet TAKE 1 TABLET (40 MG TOTAL) BY MOUTH DAILY.  . Marland Kitchenlood Glucose Monitoring Suppl (ONE TOUCH ULTRA SYSTEM KIT) W/DEVICE KIT 1 kit by Does not apply route once.  . fenofibrate 160 MG tablet TAKE 1 TABLET BY MOUTH DAILY  . glucose blood (ONE TOUCH ULTRA TEST) test strip Check daily as needed, 250.00  . HYDROcodone-acetaminophen (NORCO) 5-325 MG per tablet Take 1 tablet by mouth every 6 (six) hours as needed for moderate pain.  . Marland Kitchenisinopril  (PRINIVIL,ZESTRIL) 5 MG tablet TAKE 1 TABLET (5 MG TOTAL) BY MOUTH DAILY.  . Multiple Vitamin (MULTIVITAMIN) tablet Take 1 tablet by mouth daily.  . Marland Kitchenmeprazole (PRILOSEC OTC) 20 MG tablet Take 20 mg by mouth daily.  . sildenafil (VIAGRA) 100 MG tablet Take 0.5-1 tablets (50-100 mg total) by mouth as needed for erectile dysfunction.   No current facility-administered medications on file prior to visit.    Review of Systems Per HPI unless specifically indicated above    Objective:    BP 132/82  Pulse 80  Temp(Src) 98.2 F (36.8 C) (Oral)  Wt 246 lb 12 oz (111.925 kg)  Physical Exam  Nursing note and vitals reviewed. Constitutional: He appears well-developed and well-nourished. No distress.  HENT:  Head: Normocephalic and atraumatic.  Right Ear: External ear normal.  Left Ear: External ear normal.  Nose: Nose normal.  Mouth/Throat: Oropharynx is clear and moist. No oropharyngeal exudate.  Eyes: Conjunctivae and EOM are normal. Pupils are equal, round, and reactive to light. No scleral icterus.  Neck: Normal range of motion. Neck supple.  Cardiovascular: Normal rate, regular rhythm, normal heart sounds and intact distal pulses.   No murmur heard. Pulmonary/Chest: Effort normal and breath sounds normal. No respiratory distress. He has no wheezes. He has no rales.  Musculoskeletal: He exhibits no edema.  Diabetic foot exam: Normal inspection Some maceration between 4th/5th digits on left Calluses bilateral great toes R>L Normal DP/PT pulses Normal sensation to light touch and monofilament Nails normal  Lymphadenopathy:    He has no cervical adenopathy.  Skin: Skin is warm and dry. Rash noted.  Bilateral dorsal forearms with dry skin with small erosion and excoriations, some lichenification present  Psychiatric: He has a normal mood and affect.   Results for orders placed in visit on 05/15/13  LIPID PANEL      Result Value Ref Range   Cholesterol 145  0 - 200 mg/dL    Triglycerides 118.0  0.0 - 149.0 mg/dL   HDL 30.10 (*) >39.00 mg/dL   VLDL 23.6  0.0 - 40.0 mg/dL   LDL Cholesterol 91  0 - 99 mg/dL   Total CHOL/HDL Ratio 5    BASIC METABOLIC PANEL      Result Value Ref Range   Sodium 141  135 - 145 mEq/L   Potassium 4.2  3.5 - 5.1 mEq/L   Chloride 106  96 - 112 mEq/L   CO2 26  19 - 32 mEq/L   Glucose, Bld 134 (*) 70 - 99 mg/dL   BUN 13  6 - 23 mg/dL   Creatinine, Ser 1.1  0.4 - 1.5 mg/dL   Calcium 9.1  8.4 - 10.5 mg/dL   GFR 70.92  >60.00 mL/min  TSH      Result Value Ref Range   TSH 0.99  0.35 - 5.50 uIU/mL  HEMOGLOBIN A1C      Result Value Ref Range   Hemoglobin A1C 6.8 (*) 4.6 - 6.5 %      Assessment & Plan:   Problem List Items Addressed This Visit   T2DM (type 2 diabetes mellitus) - Primary     Chronic, stable. Diet controlled diabetes. Recheck A1c. Foot exam today.    Relevant Orders      HM DIABETES FOOT EXAM (Completed)      Hemoglobin A1c   HTN (hypertension)     Chronic, stable. Continue low dose lisinopril.    HLD (hyperlipidemia)      Chronic, stable. Continue tricor and lipitor. FLP next visit. Lab Results  Component Value Date   LDLCALC 91 05/15/2013      History of kidney stones     Brings bottle with #38 pills, #40 prescribed 05/2013.    Eczema     Exam still consistent with eczema - trial of TCI cream. Discussed combining with vaseline, discussed avoiding hot shower/baths.         Follow up plan: Return in about 6 months (around 05/17/2014), or as needed, for physical.

## 2013-11-15 NOTE — Assessment & Plan Note (Signed)
Chronic, stable. Continue tricor and lipitor. FLP next visit. Lab Results  Component Value Date   LDLCALC 91 05/15/2013

## 2013-11-15 NOTE — Assessment & Plan Note (Signed)
Chronic, stable. Diet controlled diabetes. Recheck A1c. Foot exam today.

## 2013-11-15 NOTE — Progress Notes (Signed)
Pre visit review using our clinic review tool, if applicable. No additional management support is needed unless otherwise documented below in the visit note. 

## 2013-11-15 NOTE — Addendum Note (Signed)
Addended by: Royann Shivers A on: 11/15/2013 09:21 AM   Modules accepted: Orders

## 2013-11-15 NOTE — Patient Instructions (Addendum)
Flu shot today. Decrease hot shower/bath if able. New steroid cream sent to pharmacy. Try robaxin muscle relaxant sent to pharmacy. You are doing well today. Return in 6 months for physical. Blood work today.

## 2013-11-15 NOTE — Assessment & Plan Note (Signed)
Exam still consistent with eczema - trial of TCI cream. Discussed combining with vaseline, discussed avoiding hot shower/baths.

## 2013-11-15 NOTE — Assessment & Plan Note (Signed)
Chronic, stable. Continue low dose lisinopril  

## 2013-11-15 NOTE — Assessment & Plan Note (Signed)
Brings bottle with #38 pills, #40 prescribed 05/2013.

## 2013-11-16 ENCOUNTER — Other Ambulatory Visit (INDEPENDENT_AMBULATORY_CARE_PROVIDER_SITE_OTHER): Payer: BC Managed Care – PPO

## 2013-11-16 DIAGNOSIS — E119 Type 2 diabetes mellitus without complications: Secondary | ICD-10-CM

## 2013-11-16 LAB — HEMOGLOBIN A1C: Hgb A1c MFr Bld: 6.1 % (ref 4.6–6.5)

## 2013-12-20 ENCOUNTER — Other Ambulatory Visit: Payer: Self-pay | Admitting: Family Medicine

## 2014-01-24 ENCOUNTER — Other Ambulatory Visit: Payer: Self-pay | Admitting: Family Medicine

## 2014-01-26 ENCOUNTER — Emergency Department (HOSPITAL_COMMUNITY)
Admission: EM | Admit: 2014-01-26 | Discharge: 2014-01-26 | Disposition: A | Discharge status: Home / Self Care | Payer: BC Managed Care – PPO | Attending: Emergency Medicine | Admitting: Emergency Medicine

## 2014-01-26 ENCOUNTER — Emergency Department (HOSPITAL_COMMUNITY): Payer: BC Managed Care – PPO

## 2014-01-26 ENCOUNTER — Encounter (HOSPITAL_COMMUNITY): Payer: Self-pay | Admitting: Emergency Medicine

## 2014-01-26 DIAGNOSIS — N529 Male erectile dysfunction, unspecified: Secondary | ICD-10-CM | POA: Insufficient documentation

## 2014-01-26 DIAGNOSIS — K219 Gastro-esophageal reflux disease without esophagitis: Secondary | ICD-10-CM | POA: Insufficient documentation

## 2014-01-26 DIAGNOSIS — N132 Hydronephrosis with renal and ureteral calculous obstruction: Secondary | ICD-10-CM | POA: Insufficient documentation

## 2014-01-26 DIAGNOSIS — Z7982 Long term (current) use of aspirin: Secondary | ICD-10-CM | POA: Insufficient documentation

## 2014-01-26 DIAGNOSIS — Z87891 Personal history of nicotine dependence: Secondary | ICD-10-CM | POA: Insufficient documentation

## 2014-01-26 DIAGNOSIS — Z79899 Other long term (current) drug therapy: Secondary | ICD-10-CM | POA: Diagnosis not present

## 2014-01-26 DIAGNOSIS — E785 Hyperlipidemia, unspecified: Secondary | ICD-10-CM | POA: Insufficient documentation

## 2014-01-26 DIAGNOSIS — R109 Unspecified abdominal pain: Secondary | ICD-10-CM

## 2014-01-26 DIAGNOSIS — E119 Type 2 diabetes mellitus without complications: Secondary | ICD-10-CM | POA: Diagnosis not present

## 2014-01-26 DIAGNOSIS — E669 Obesity, unspecified: Secondary | ICD-10-CM | POA: Insufficient documentation

## 2014-01-26 DIAGNOSIS — I10 Essential (primary) hypertension: Secondary | ICD-10-CM | POA: Insufficient documentation

## 2014-01-26 DIAGNOSIS — N201 Calculus of ureter: Secondary | ICD-10-CM

## 2014-01-26 LAB — CBC WITH DIFFERENTIAL/PLATELET
Basophils Absolute: 0 10*3/uL (ref 0.0–0.1)
Basophils Relative: 0 % (ref 0–1)
Eosinophils Absolute: 0.1 10*3/uL (ref 0.0–0.7)
Eosinophils Relative: 1 % (ref 0–5)
HCT: 43.5 % (ref 39.0–52.0)
Hemoglobin: 15 g/dL (ref 13.0–17.0)
LYMPHS ABS: 1.5 10*3/uL (ref 0.7–4.0)
LYMPHS PCT: 14 % (ref 12–46)
MCH: 30.7 pg (ref 26.0–34.0)
MCHC: 34.5 g/dL (ref 30.0–36.0)
MCV: 89.1 fL (ref 78.0–100.0)
Monocytes Absolute: 0.6 10*3/uL (ref 0.1–1.0)
Monocytes Relative: 6 % (ref 3–12)
NEUTROS PCT: 79 % — AB (ref 43–77)
Neutro Abs: 8.4 10*3/uL — ABNORMAL HIGH (ref 1.7–7.7)
PLATELETS: 166 10*3/uL (ref 150–400)
RBC: 4.88 MIL/uL (ref 4.22–5.81)
RDW: 12.7 % (ref 11.5–15.5)
WBC: 10.7 10*3/uL — AB (ref 4.0–10.5)

## 2014-01-26 LAB — URINALYSIS, ROUTINE W REFLEX MICROSCOPIC
Bilirubin Urine: NEGATIVE
Glucose, UA: NEGATIVE mg/dL
Ketones, ur: NEGATIVE mg/dL
NITRITE: NEGATIVE
PROTEIN: NEGATIVE mg/dL
Specific Gravity, Urine: 1.016 (ref 1.005–1.030)
UROBILINOGEN UA: 1 mg/dL (ref 0.0–1.0)
pH: 7 (ref 5.0–8.0)

## 2014-01-26 LAB — BASIC METABOLIC PANEL
Anion gap: 12 (ref 5–15)
BUN: 27 mg/dL — ABNORMAL HIGH (ref 6–23)
CO2: 26 mEq/L (ref 19–32)
Calcium: 10.3 mg/dL (ref 8.4–10.5)
Chloride: 107 mEq/L (ref 96–112)
Creatinine, Ser: 1.51 mg/dL — ABNORMAL HIGH (ref 0.50–1.35)
GFR calc Af Amer: 57 mL/min — ABNORMAL LOW (ref >90)
GFR, EST NON AFRICAN AMERICAN: 49 mL/min — AB (ref >90)
Glucose, Bld: 173 mg/dL — ABNORMAL HIGH (ref 70–99)
POTASSIUM: 4.2 meq/L (ref 3.7–5.3)
SODIUM: 145 meq/L (ref 137–147)

## 2014-01-26 LAB — URINE MICROSCOPIC-ADD ON

## 2014-01-26 MED ORDER — IBUPROFEN 600 MG PO TABS: 600.0000 mg | ORAL_TABLET | Freq: Four times a day (QID) | ORAL | Status: DC | PRN

## 2014-01-26 MED ORDER — KETOROLAC TROMETHAMINE 30 MG/ML IJ SOLN
30.0000 mg | Freq: Once | INTRAMUSCULAR | Status: AC
  Administered 2014-01-26: 30 mg via INTRAVENOUS
  Filled 2014-01-26: qty 1

## 2014-01-26 MED ORDER — ONDANSETRON 8 MG PO TBDP: 8.0000 mg | ORAL_TABLET | Freq: Three times a day (TID) | ORAL | Status: DC | PRN

## 2014-01-26 MED ORDER — ONDANSETRON HCL 4 MG/2ML IJ SOLN: 4.0000 mg | Freq: Four times a day (QID) | INTRAMUSCULAR | Status: DC | PRN

## 2014-01-26 MED ORDER — FENTANYL CITRATE 0.05 MG/ML IJ SOLN
50.0000 ug | INTRAMUSCULAR | Status: DC | PRN
Start: 2014-01-26 — End: 2014-01-26

## 2014-01-26 MED ORDER — ONDANSETRON HCL 4 MG/2ML IJ SOLN
4.0000 mg | Freq: Once | INTRAMUSCULAR | Status: AC
  Administered 2014-01-26: 4 mg via INTRAVENOUS
  Filled 2014-01-26: qty 2

## 2014-01-26 MED ORDER — HYDROCODONE-ACETAMINOPHEN 5-325 MG PO TABS: 1.0000 | ORAL_TABLET | Freq: Four times a day (QID) | ORAL | Status: DC | PRN

## 2014-01-26 NOTE — Discharge Instructions (Signed)
You have a kidney stone. Likely to pass on it's own - but if there is fevers, or severe pain not responding to pain meds, come back to [place] [facility].   Ureteral Colic (Kidney Stones) Ureteral colic is the result of a condition when kidney stones form inside the kidney. Once kidney stones are formed they may move into the tube that connects the kidney with the bladder (ureter). If this occurs, this condition may cause pain (colic) in the ureter.  CAUSES  Pain is caused by stone movement in the ureter and the obstruction caused by the stone. SYMPTOMS  The pain comes and goes as the ureter contracts around the stone. The pain is usually intense, sharp, and stabbing in character. The location of the pain may move as the stone moves through the ureter. When the stone is near the kidney the pain is usually located in the back and radiates to the belly (abdomen). When the stone is ready to pass into the bladder the pain is often located in the lower abdomen on the side the stone is located. At this location, the symptoms may mimic those of a urinary tract infection with urinary frequency. Once the stone is located here it often passes into the bladder and the pain disappears completely. TREATMENT   Your caregiver will provide you with medicine for pain relief.  You may require specialized follow-up X-rays.  The absence of pain does not always mean that the stone has passed. It may have just stopped moving. If the urine remains completely obstructed, it can cause loss of kidney function or even complete destruction of the involved kidney. It is your responsibility and in your interest that X-rays and follow-ups as suggested by your caregiver are completed. Relief of pain without passage of the stone can be associated with severe damage to the kidney, including loss of kidney function on that side.  If your stone does not pass on its own, additional measures may be taken by your caregiver to ensure its  removal. HOME CARE INSTRUCTIONS   Increase your fluid intake. Water is the preferred fluid since juices containing vitamin C may acidify the urine making it less likely for certain stones (uric acid stones) to pass.  Strain all urine. A strainer will be provided. Keep all particulate matter or stones for your caregiver to inspect.  Take your pain medicine as directed.  Make a follow-up appointment with your caregiver as directed.  Remember that the goal is passage of your stone. The absence of pain does not mean the stone is gone. Follow your caregiver's instructions.  Only take over-the-counter or prescription medicines for pain, discomfort, or fever as directed by your caregiver. SEEK MEDICAL CARE IF:   Pain cannot be controlled with the prescribed medicine.  You have a fever.  Pain continues for longer than your caregiver advises it should.  There is a change in the pain, and you develop chest discomfort or constant abdominal pain.  You feel faint or pass out. MAKE SURE YOU:   Understand these instructions.  Will watch your condition.  Will get help right away if you are not doing well or get worse. Document Released: 11/05/2004 Document Revised: 05/23/2012 Document Reviewed: 07/23/2010 [facility] Patient Information 2015 [place], [place]. This information is not intended to replace advice given to you by your health care provider. Make sure you discuss any questions you have with your health care provider.

## 2014-01-26 NOTE — ED Notes (Signed)
MD at bedside. 

## 2014-01-26 NOTE — ED Notes (Signed)
Patient resting on stretcher states he is feeling much better

## 2014-01-26 NOTE — ED Notes (Signed)
Patient reports right flank pain x3 days but worse tonight. Reports nausea and 1 episode of emesis. Pain 10/10

## 2014-01-26 NOTE — ED Notes (Signed)
RN went in to d/c pt IV, pt already taken IV out. No bleeding noted. Catheter still intact to IV. NAD.

## 2014-01-28 NOTE — ED Provider Notes (Signed)
CSN: [id number]     Arrival date & time 01/26/14  0501 History   First MD Initiated Contact with Patient 01/26/14 [phone]     Chief Complaint  Patient presents with  . Flank Pain     (Consider location/radiation/quality/duration/timing/severity/associated sxs/prior Treatment) HPI Comments: Pt has hx of renal stones, 3 years ago, and his current pain is exacetly the same. Pain is R flank. No uti like sx.  Patient is a 58 y.o. male presenting with flank pain. The history is provided by the patient.  Flank Pain This is a new problem. The current episode started 1 to 2 hours ago. The problem occurs constantly. The problem has been gradually worsening. Associated symptoms include abdominal pain. Pertinent negatives include no chest pain and no shortness of breath. Nothing aggravates the symptoms. Nothing relieves the symptoms. He has tried acetaminophen for the symptoms.    Past Medical History  Diagnosis Date  . HTN (hypertension)   . HLD (hyperlipidemia)   . History of kidney stones 2013    s/p lithotripsy  . ED (erectile dysfunction)   . Obesity   . GERD (gastroesophageal reflux disease)   . T2DM (type 2 diabetes mellitus)     DSME at [facility] 01/2012   Past Surgical History  Procedure Laterality Date  . Cholecystectomy  2007  . Lithotripsy  2013    x 2  . Colonoscopy  11/2008    2 hyperplastic polyps [name])   Family History  Problem Relation Age of Onset  . Cancer Father 76    lung, smoker  . Aneurysm Mother 97    brain  . Coronary artery disease Neg Hx   . Stroke Neg Hx   . Diabetes Neg Hx    History  Substance Use Topics  . Smoking status: Former Smoker    Quit date: 02/10/2007  . Smokeless tobacco: Never Used  . Alcohol Use: Yes     Comment: 5 drinks/week    Review of Systems  Constitutional: Negative for activity change and appetite change.  Respiratory: Negative for cough and shortness of breath.   Cardiovascular: Negative for chest pain.  Gastrointestinal:  Positive for nausea, vomiting and abdominal pain.  Genitourinary: Positive for flank pain. Negative for dysuria.      Allergies  Oxycodone  Home Medications   Prior to Admission medications   Medication Sig Start Date End Date Taking? Authorizing Provider  aspirin 81 MG tablet Take 81 mg by mouth daily.   Yes Historical Provider, MD  atorvastatin (LIPITOR) 40 MG tablet Take 40 mg by mouth daily.   Yes Historical Provider, MD  fenofibrate 160 MG tablet Take 160 mg by mouth daily.   Yes Historical Provider, MD  HYDROcodone-acetaminophen (NORCO) 5-325 MG per tablet Take 1 tablet by mouth every 6 (six) hours as needed for moderate pain. 05/15/13  Yes Ria Bush, MD  lisinopril (PRINIVIL,ZESTRIL) 5 MG tablet Take 5 mg by mouth daily.   Yes Historical Provider, MD  Multiple Vitamin (MULTIVITAMIN) tablet Take 1 tablet by mouth daily.   Yes Historical Provider, MD  omeprazole (PRILOSEC OTC) 20 MG tablet Take 20 mg by mouth daily.   Yes Historical Provider, MD  sildenafil (VIAGRA) 100 MG tablet Take 0.5-1 tablets (50-100 mg total) by mouth as needed for erectile dysfunction. 01/28/13  Yes Ria Bush, MD  triamcinolone cream (KENALOG) 0.1 % Apply 1 application topically 2 (two) times daily. Apply to [place]. 11/15/13  Yes Ria Bush, MD  atorvastatin (LIPITOR) 40 MG tablet TAKE  1 TABLET (40 MG TOTAL) BY MOUTH DAILY. 12/18/12   Ria Bush, MD  Blood Glucose Monitoring Suppl (ONE TOUCH ULTRA SYSTEM KIT) W/DEVICE KIT 1 kit by Does not apply route once. 04/20/12   Ria Bush, MD  fenofibrate 160 MG tablet TAKE 1 TABLET BY MOUTH DAILY 01/24/14   Ria Bush, MD  glucose blood (ONE TOUCH ULTRA TEST) test strip Check daily as needed, 250.00 04/20/12   Ria Bush, MD  HYDROcodone-acetaminophen (NORCO/VICODIN) 5-325 MG per tablet Take 1 tablet by mouth every 6 (six) hours as needed. 01/26/14   Varney Biles, MD  ibuprofen (ADVIL,MOTRIN) 600 MG tablet Take 1 tablet (600 mg  total) by mouth every 6 (six) hours as needed. 01/26/14   Varney Biles, MD  lisinopril (PRINIVIL,ZESTRIL) 5 MG tablet TAKE 1 TABLET (5 MG TOTAL) BY MOUTH DAILY. 12/20/13   Ria Bush, MD  methocarbamol (ROBAXIN) 500 MG tablet Take 1 tablet (500 mg total) by mouth 3 (three) times daily as needed for muscle spasms (sedation precautions). Patient not taking: Reported on 01/26/2014 11/15/13   Ria Bush, MD  ondansetron (ZOFRAN ODT) 8 MG disintegrating tablet Take 1 tablet (8 mg total) by mouth every 8 (eight) hours as needed for nausea. 01/26/14   Ankit Nanavati, MD   BP 160/98 mmHg  Pulse 66  Temp(Src) 98.2 F (36.8 C) (Oral)  Resp 16  SpO2 98% Physical Exam  Constitutional: He is oriented to person, place, and time. He appears well-developed.  HENT:  Head: Normocephalic and atraumatic.  Eyes: Conjunctivae and EOM are normal. Pupils are equal, round, and reactive to light.  Neck: Normal range of motion. Neck supple.  Cardiovascular: Normal rate and regular rhythm.   Pulmonary/Chest: Effort normal and breath sounds normal.  Abdominal: Soft. Bowel sounds are normal. He exhibits no distension. There is no tenderness. There is no rebound and no guarding.  Neurological: He is alert and oriented to person, place, and time.  Skin: Skin is warm.  Nursing note and vitals reviewed.   ED Course  Procedures (including critical care time) Labs Review Labs Reviewed  CBC WITH DIFFERENTIAL - Abnormal; Notable for the following:    WBC 10.7 (*)    Neutrophils Relative % 79 (*)    Neutro Abs 8.4 (*)    All other components within normal limits  BASIC METABOLIC PANEL - Abnormal; Notable for the following:    Glucose, Bld 173 (*)    BUN 27 (*)    Creatinine, Ser 1.51 (*)    GFR calc non Af Amer 49 (*)    GFR calc Af Amer 57 (*)    All other components within normal limits  URINALYSIS, ROUTINE W REFLEX MICROSCOPIC - Abnormal; Notable for the following:    APPearance TURBID (*)     Hgb urine dipstick LARGE (*)    Leukocytes, UA SMALL (*)    All other components within normal limits  URINE MICROSCOPIC-ADD ON - Abnormal; Notable for the following:    Bacteria, UA FEW (*)    All other components within normal limits    Imaging Review No results found.   EKG Interpretation None      MDM   Final diagnoses:  Right lateral abdominal pain  Hydronephrosis with urinary obstruction due to ureteral calculus  Ureterolithiasis    Pt comes in with cc of flank pain, nausea, emesis. Sudden onset. Hx of renal stones, and current sx consistent with that. IV toradol given, and adequate analgesia achieved, with [place] confirming hydronephrosis. Stable  for d/c. Return precautions discussed.    Varney Biles, MD 01/28/14 [phone]

## 2014-02-04 ENCOUNTER — Other Ambulatory Visit: Payer: Self-pay | Admitting: Family Medicine

## 2014-05-07 ENCOUNTER — Other Ambulatory Visit: Payer: Self-pay | Admitting: Family Medicine

## 2014-05-13 ENCOUNTER — Other Ambulatory Visit: Payer: Self-pay | Admitting: Family Medicine

## 2014-05-13 DIAGNOSIS — Z125 Encounter for screening for malignant neoplasm of prostate: Secondary | ICD-10-CM

## 2014-05-13 DIAGNOSIS — I10 Essential (primary) hypertension: Secondary | ICD-10-CM

## 2014-05-13 DIAGNOSIS — E785 Hyperlipidemia, unspecified: Secondary | ICD-10-CM

## 2014-05-13 DIAGNOSIS — E669 Obesity, unspecified: Secondary | ICD-10-CM

## 2014-05-13 DIAGNOSIS — E119 Type 2 diabetes mellitus without complications: Secondary | ICD-10-CM

## 2014-05-15 ENCOUNTER — Other Ambulatory Visit (INDEPENDENT_AMBULATORY_CARE_PROVIDER_SITE_OTHER): Payer: BLUE CROSS/BLUE SHIELD

## 2014-05-15 DIAGNOSIS — E785 Hyperlipidemia, unspecified: Secondary | ICD-10-CM

## 2014-05-15 DIAGNOSIS — Z125 Encounter for screening for malignant neoplasm of prostate: Secondary | ICD-10-CM

## 2014-05-15 DIAGNOSIS — E119 Type 2 diabetes mellitus without complications: Secondary | ICD-10-CM

## 2014-05-15 LAB — LIPID PANEL
Cholesterol: 141 mg/dL (ref 0–200)
HDL: 25.3 mg/dL — ABNORMAL LOW (ref >39.00)
NonHDL: 115.7
TRIGLYCERIDES: 273 mg/dL — AB (ref 0.0–149.0)
Total CHOL/HDL Ratio: 6
VLDL: 54.6 mg/dL — ABNORMAL HIGH (ref 0.0–40.0)

## 2014-05-15 LAB — MICROALBUMIN / CREATININE URINE RATIO
Creatinine,U: 116.4 mg/dL
MICROALB/CREAT RATIO: 1.5 mg/g (ref 0.0–30.0)
Microalb, Ur: 1.7 mg/dL (ref 0.0–1.9)

## 2014-05-15 LAB — COMPREHENSIVE METABOLIC PANEL
ALBUMIN: 4.5 g/dL (ref 3.5–5.2)
ALT: 27 U/L (ref 0–53)
AST: 20 U/L (ref 0–37)
Alkaline Phosphatase: 64 U/L (ref 39–117)
BUN: 28 mg/dL — ABNORMAL HIGH (ref 6–23)
CO2: 28 meq/L (ref 19–32)
Calcium: 10.1 mg/dL (ref 8.4–10.5)
Chloride: 106 mEq/L (ref 96–112)
Creatinine, Ser: 1.07 mg/dL (ref 0.40–1.50)
GFR: 75.26 mL/min (ref >60.00)
Glucose, Bld: 131 mg/dL — ABNORMAL HIGH (ref 70–99)
POTASSIUM: 4 meq/L (ref 3.5–5.1)
Sodium: 140 mEq/L (ref 135–145)
TOTAL PROTEIN: 7 g/dL (ref 6.0–8.3)
Total Bilirubin: 0.7 mg/dL (ref 0.2–1.2)

## 2014-05-15 LAB — LDL CHOLESTEROL, DIRECT: Direct LDL: 80 mg/dL

## 2014-05-15 LAB — HEMOGLOBIN A1C: Hgb A1c MFr Bld: 6.4 % (ref 4.6–6.5)

## 2014-05-15 LAB — PSA: PSA: 1.4 ng/mL (ref 0.10–4.00)

## 2014-05-22 ENCOUNTER — Ambulatory Visit (INDEPENDENT_AMBULATORY_CARE_PROVIDER_SITE_OTHER): Payer: BLUE CROSS/BLUE SHIELD | Admitting: Family Medicine

## 2014-05-22 ENCOUNTER — Encounter: Payer: BC Managed Care – PPO | Admitting: Family Medicine

## 2014-05-22 ENCOUNTER — Encounter: Payer: Self-pay | Admitting: Family Medicine

## 2014-05-22 VITALS — BP 132/80 | HR 71 | Temp 98.3°F | Resp 16 | Ht 71.0 in | Wt 250.8 lb

## 2014-05-22 DIAGNOSIS — N529 Male erectile dysfunction, unspecified: Secondary | ICD-10-CM

## 2014-05-22 DIAGNOSIS — E119 Type 2 diabetes mellitus without complications: Secondary | ICD-10-CM

## 2014-05-22 DIAGNOSIS — E669 Obesity, unspecified: Secondary | ICD-10-CM

## 2014-05-22 DIAGNOSIS — Z0000 Encounter for general adult medical examination without abnormal findings: Secondary | ICD-10-CM | POA: Diagnosis not present

## 2014-05-22 DIAGNOSIS — I10 Essential (primary) hypertension: Secondary | ICD-10-CM

## 2014-05-22 DIAGNOSIS — L309 Dermatitis, unspecified: Secondary | ICD-10-CM

## 2014-05-22 DIAGNOSIS — E785 Hyperlipidemia, unspecified: Secondary | ICD-10-CM

## 2014-05-22 MED ORDER — CLOBETASOL PROPIONATE 0.05 % EX CREA: 1.0000 "application " | TOPICAL_CREAM | Freq: Two times a day (BID) | CUTANEOUS | Status: DC

## 2014-05-22 MED ORDER — SILDENAFIL CITRATE 20 MG PO TABS: 40.0000 mg | ORAL_TABLET | Freq: Every day | ORAL | Status: DC | PRN

## 2014-05-22 MED ORDER — HYDROCODONE-ACETAMINOPHEN 5-325 MG PO TABS: 1.0000 | ORAL_TABLET | Freq: Four times a day (QID) | ORAL | Status: DC | PRN

## 2014-05-22 NOTE — Assessment & Plan Note (Signed)
Trial generic sildenafil. viagra coupon provided today.

## 2014-05-22 NOTE — Patient Instructions (Addendum)
Trial stronger steroid cream to arms for 2 weeks. May use vaseline and wrap at night time. Then just plain vaseline or moisturizer daily. Work on triglyceride levels. Watch added sugars.  Return in 1 year for next physical.  Good to see you, call us with questions.

## 2014-05-22 NOTE — Assessment & Plan Note (Signed)
Chronic, stable. Will need foot exam next visit. Discussed healthy diet

## 2014-05-22 NOTE — Assessment & Plan Note (Signed)
Chronic, reviewed #s with patient. Trig elevated - discussed dietary changes to lower trig, discussed addition of fish oil. Pt will try diet changes first.

## 2014-05-22 NOTE — Progress Notes (Signed)
Pre visit review using our clinic review tool, if applicable. No additional management support is needed unless otherwise documented below in the visit note. 

## 2014-05-22 NOTE — Assessment & Plan Note (Addendum)
Chronic, stable. Continue low dose lisinopril  

## 2014-05-22 NOTE — Progress Notes (Signed)
BP 132/80 mmHg  Pulse 71  Temp(Src) 98.3 F (36.8 C) (Oral)  Resp 16  Ht 5' 11"  (1.803 m)  Wt 250 lb 12.8 oz (113.762 kg)  BMI 34.99 kg/m2  SpO2 95%   CC: CPE  Subjective:    Patient ID: William Johns, male    DOB: Feb 16, 1955, 59 y.o.   MRN: [id number]  HPI: William Johns is a 59 y.o. male presenting on 05/22/2014 for CPE   Kidney stone - ER visit 01/2014. Bad kidney stone. Requests refill hydrocodone, last filled #30 05/2013.   Preventative: Colon cancer screening - 11/2008 2 hyperplastic polyps rec rpt 10 yrs [name]).  Prostate cancer screening - discussed - prefers to hold off on screening currently. No prostate cancer hx. Nocturia x1-2. No hesitancy, dribbling or weak stream. No lower back pain.  Flu 11/2013 pnemovax 2014 Tdap 2013  Lives with girlfriend, and her son, 2 pets  Occupation: self employed, Optometrist in furniture business  Edu: HS  Activity: walks 1 mi 5 x/wk  Diet: good water, fruits/vegetables daily, changed to diet soda, decreased portion size   Relevant past medical, surgical, family and social history reviewed and updated as indicated. Interim medical history since our last visit reviewed. Allergies and medications reviewed and updated. Current Outpatient Prescriptions on File Prior to Visit  Medication Sig  . aspirin 81 MG tablet Take 81 mg by mouth daily.  . Blood Glucose Monitoring Suppl (ONE TOUCH ULTRA SYSTEM KIT) W/DEVICE KIT 1 kit by Does not apply route once.  . fenofibrate 160 MG tablet TAKE 1 TABLET BY MOUTH DAILY  . glucose blood (ONE TOUCH ULTRA TEST) test strip Check daily as needed, 250.00  . lisinopril (PRINIVIL,ZESTRIL) 5 MG tablet TAKE 1 TABLET (5 MG TOTAL) BY MOUTH DAILY.  . methocarbamol (ROBAXIN) 500 MG tablet Take 1 tablet (500 mg total) by mouth 3 (three) times daily as needed for muscle spasms (sedation precautions).  . Multiple Vitamin (MULTIVITAMIN) tablet Take 1 tablet by mouth daily.  Marland Kitchen omeprazole (PRILOSEC OTC) 20 MG tablet  Take 20 mg by mouth daily.  Marland Kitchen VIAGRA 100 MG tablet TAKE 1/2 TO 1 TABLET BY MOUTH AS NEEDED FOR ERECTILE DYSFUNCTION   No current facility-administered medications on file prior to visit.    Review of Systems  Constitutional: Negative for fever, chills, activity change, appetite change, fatigue and unexpected weight change.  HENT: Negative for hearing loss.   Eyes: Negative for visual disturbance.  Respiratory: Negative for cough, chest tightness, shortness of breath and wheezing.   Cardiovascular: Positive for chest pain (rare fleeting sharp). Negative for palpitations and leg swelling.  Gastrointestinal: Negative for nausea, vomiting, abdominal pain, diarrhea, constipation, blood in stool and abdominal distention.  Genitourinary: Negative for hematuria and difficulty urinating.  Musculoskeletal: Negative for myalgias, arthralgias and neck pain.  Skin: Negative for rash.  Neurological: Negative for dizziness, seizures, syncope and headaches.  Hematological: Negative for adenopathy. Does not bruise/bleed easily.  Psychiatric/Behavioral: Negative for dysphoric mood. The patient is not nervous/anxious.     Per HPI unless specifically indicated above     Objective:    BP 132/80 mmHg  Pulse 71  Temp(Src) 98.3 F (36.8 C) (Oral)  Resp 16  Ht 5' 11"  (1.803 m)  Wt 250 lb 12.8 oz (113.762 kg)  BMI 34.99 kg/m2  SpO2 95%  Wt Readings from Last 3 Encounters:  05/22/14 250 lb 12.8 oz (113.762 kg)  11/15/13 246 lb 12 oz (111.925 kg)  05/15/13 251 lb 4 oz (113.966  kg)    Physical Exam  Constitutional: He is oriented to person, place, and time. He appears well-developed and well-nourished. No distress.  HENT:  Head: Normocephalic and atraumatic.  Right Ear: Hearing, tympanic membrane, external ear and ear canal normal.  Left Ear: Hearing, tympanic membrane, external ear and ear canal normal.  Nose: Nose normal.  Mouth/Throat: Uvula is midline, oropharynx is clear and moist and mucous  membranes are normal. No oropharyngeal exudate, posterior oropharyngeal edema or posterior oropharyngeal erythema.  Eyes: Conjunctivae and EOM are normal. Pupils are equal, round, and reactive to light. No scleral icterus.  Neck: Normal range of motion. Neck supple. Carotid bruit is not present. No thyromegaly present.  Cardiovascular: Normal rate, regular rhythm, normal heart sounds and intact distal pulses.   No murmur heard. Pulses:      Radial pulses are 2+ on the right side, and 2+ on the left side.  Pulmonary/Chest: Effort normal and breath sounds normal. No respiratory distress. He has no wheezes. He has no rales.  Abdominal: Soft. Bowel sounds are normal. He exhibits no distension and no mass. There is no tenderness. There is no rebound and no guarding.  Genitourinary:  Rectal - deferred   Musculoskeletal: Normal range of motion. He exhibits no edema.  Lymphadenopathy:    He has no cervical adenopathy.  Neurological: He is alert and oriented to person, place, and time.  CN grossly intact, station and gait intact  Skin: Skin is warm and dry. No rash noted.  Psychiatric: He has a normal mood and affect. His behavior is normal. Judgment and thought content normal.  Nursing note and vitals reviewed.  Results for orders placed or performed in visit on 05/15/14  Microalbumin / creatinine urine ratio  Result Value Ref Range   Microalb, Ur 1.7 0.0 - 1.9 mg/dL   Creatinine,U 116.4 mg/dL   Microalb Creat Ratio 1.5 0.0 - 30.0 mg/g  Lipid panel  Result Value Ref Range   Cholesterol 141 0 - 200 mg/dL   Triglycerides 273.0 (H) 0.0 - 149.0 mg/dL   HDL 25.30 (L) >39.00 mg/dL   VLDL 54.6 (H) 0.0 - 40.0 mg/dL   Total CHOL/HDL Ratio 6    NonHDL 115.70   Comprehensive metabolic panel  Result Value Ref Range   Sodium 140 135 - 145 mEq/L   Potassium 4.0 3.5 - 5.1 mEq/L   Chloride 106 96 - 112 mEq/L   CO2 28 19 - 32 mEq/L   Glucose, Bld 131 (H) 70 - 99 mg/dL   BUN 28 (H) 6 - 23 mg/dL    Creatinine, Ser 1.07 0.40 - 1.50 mg/dL   Total Bilirubin 0.7 0.2 - 1.2 mg/dL   Alkaline Phosphatase 64 39 - 117 U/L   AST 20 0 - 37 U/L   ALT 27 0 - 53 U/L   Total Protein 7.0 6.0 - 8.3 g/dL   Albumin 4.5 3.5 - 5.2 g/dL   Calcium 10.1 8.4 - 10.5 mg/dL   GFR 75.26 >60.00 mL/min  Hemoglobin A1c  Result Value Ref Range   Hgb A1c MFr Bld 6.4 4.6 - 6.5 %  PSA  Result Value Ref Range   PSA 1.40 0.10 - 4.00 ng/mL  LDL cholesterol, direct  Result Value Ref Range   Direct LDL 80.0 mg/dL      Assessment & Plan:   Problem List Items Addressed This Visit    Obesity    Discussed healthy diet and lifestyle changes to affect sustainable weight loss.  HTN (hypertension)    Chronic, stable. Continue low dose lisinopril       Relevant Medications   sildenafil (REVATIO) 20 MG tablet   atorvastatin (LIPITOR) tablet   Health maintenance examination - Primary    Preventative protocols reviewed and updated unless pt declined. Discussed healthy diet and lifestyle.       ED (erectile dysfunction)    Trial generic sildenafil. viagra coupon provided today.      Eczema    Discussed skin care. Will treat with clobetasol daily for 2 wks , discussed vaseline and occlusive dressing use.      Dyslipidemia    Chronic, reviewed #s with patient. Trig elevated - discussed dietary changes to lower trig, discussed addition of fish oil. Pt will try diet changes first.      Relevant Medications   atorvastatin (LIPITOR) tablet   Diet-controlled type 2 diabetes mellitus    Chronic, stable. Will need foot exam next visit. Discussed healthy diet       Relevant Medications   atorvastatin (LIPITOR) tablet       Follow up plan: Return in about 1 year (around 05/22/2015), or as needed, for annual exam, prior fasting for blood work.

## 2014-05-22 NOTE — Assessment & Plan Note (Signed)
Preventative protocols reviewed and updated unless pt declined. Discussed healthy diet and lifestyle.  

## 2014-05-22 NOTE — Assessment & Plan Note (Signed)
Discussed healthy diet and lifestyle changes to affect sustainable weight loss  

## 2014-05-22 NOTE — Assessment & Plan Note (Signed)
Discussed skin care. Will treat with clobetasol daily for 2 wks , discussed vaseline and occlusive dressing use.

## 2014-05-31 ENCOUNTER — Telehealth: Payer: Self-pay | Admitting: *Deleted

## 2014-05-31 NOTE — Telephone Encounter (Signed)
[facility]ax received from [facility] [place] for PA on Sildenafil 20 mg.  PA submitted through [facility], awaiting response.

## 2014-09-05 ENCOUNTER — Other Ambulatory Visit: Payer: Self-pay | Admitting: Family Medicine

## 2014-09-17 MED ORDER — LISINOPRIL 5 MG PO TABS: ORAL_TABLET | ORAL | Status: DC

## 2014-09-17 MED ORDER — FENOFIBRATE 160 MG PO TABS: 160.0000 mg | ORAL_TABLET | Freq: Every day | ORAL | Status: DC

## 2014-09-17 NOTE — Telephone Encounter (Signed)
pts wife said pt is in [facility] and [facility] cannot get transferred refills on fenofibrate and lisinopril. Spoke with [place] at [facility] and did not get 09/05/14 electronic refills on these meds. Medication phoned to Vicente Males at [facility] as instructed. pts wife notified rx are available and [facility]. pts wife voiced understanding.

## 2014-09-17 NOTE — Addendum Note (Signed)
Addended by: Helene Shoe on: 09/17/2014 12:53 PM   Modules accepted: Orders

## 2014-10-29 ENCOUNTER — Encounter: Payer: Self-pay | Admitting: Gastroenterology

## 2015-01-15 ENCOUNTER — Ambulatory Visit (INDEPENDENT_AMBULATORY_CARE_PROVIDER_SITE_OTHER): Payer: BLUE CROSS/BLUE SHIELD

## 2015-01-15 DIAGNOSIS — Z23 Encounter for immunization: Secondary | ICD-10-CM | POA: Diagnosis not present

## 2015-01-22 ENCOUNTER — Ambulatory Visit: Payer: BLUE CROSS/BLUE SHIELD | Admitting: Family Medicine

## 2015-02-06 ENCOUNTER — Other Ambulatory Visit: Payer: Self-pay | Admitting: Family Medicine

## 2015-04-14 ENCOUNTER — Other Ambulatory Visit: Payer: Self-pay | Admitting: Family Medicine

## 2015-05-22 ENCOUNTER — Other Ambulatory Visit (INDEPENDENT_AMBULATORY_CARE_PROVIDER_SITE_OTHER): Payer: BLUE CROSS/BLUE SHIELD

## 2015-05-22 ENCOUNTER — Other Ambulatory Visit: Payer: Self-pay | Admitting: Family Medicine

## 2015-05-22 DIAGNOSIS — E119 Type 2 diabetes mellitus without complications: Secondary | ICD-10-CM | POA: Diagnosis not present

## 2015-05-22 DIAGNOSIS — Z125 Encounter for screening for malignant neoplasm of prostate: Secondary | ICD-10-CM

## 2015-05-22 DIAGNOSIS — I10 Essential (primary) hypertension: Secondary | ICD-10-CM

## 2015-05-22 DIAGNOSIS — Z1159 Encounter for screening for other viral diseases: Secondary | ICD-10-CM | POA: Diagnosis not present

## 2015-05-22 DIAGNOSIS — E785 Hyperlipidemia, unspecified: Secondary | ICD-10-CM | POA: Diagnosis not present

## 2015-05-22 LAB — LIPID PANEL
CHOL/HDL RATIO: 6
Cholesterol: 148 mg/dL (ref 0–200)
HDL: 24.9 mg/dL — AB (ref >39.00)
NonHDL: 122.72
Triglycerides: 363 mg/dL — ABNORMAL HIGH (ref 0.0–149.0)
VLDL: 72.6 mg/dL — AB (ref 0.0–40.0)

## 2015-05-22 LAB — COMPREHENSIVE METABOLIC PANEL
ALT: 37 U/L (ref 0–53)
AST: 27 U/L (ref 0–37)
Albumin: 4.2 g/dL (ref 3.5–5.2)
Alkaline Phosphatase: 81 U/L (ref 39–117)
BUN: 19 mg/dL (ref 6–23)
CHLORIDE: 104 meq/L (ref 96–112)
CO2: 31 meq/L (ref 19–32)
CREATININE: 1.17 mg/dL (ref 0.40–1.50)
Calcium: 9.6 mg/dL (ref 8.4–10.5)
GFR: 67.65 mL/min (ref >60.00)
Glucose, Bld: 273 mg/dL — ABNORMAL HIGH (ref 70–99)
POTASSIUM: 4.1 meq/L (ref 3.5–5.1)
SODIUM: 140 meq/L (ref 135–145)
Total Bilirubin: 0.6 mg/dL (ref 0.2–1.2)
Total Protein: 6.6 g/dL (ref 6.0–8.3)

## 2015-05-22 LAB — HEMOGLOBIN A1C: Hgb A1c MFr Bld: 8.7 % — ABNORMAL HIGH (ref 4.6–6.5)

## 2015-05-22 LAB — LDL CHOLESTEROL, DIRECT: Direct LDL: 74 mg/dL

## 2015-05-22 LAB — PSA: PSA: 1 ng/mL (ref 0.10–4.00)

## 2015-05-23 LAB — HEPATITIS C ANTIBODY: HCV AB: NEGATIVE

## 2015-05-29 ENCOUNTER — Ambulatory Visit (INDEPENDENT_AMBULATORY_CARE_PROVIDER_SITE_OTHER): Payer: BLUE CROSS/BLUE SHIELD | Admitting: Family Medicine

## 2015-05-29 ENCOUNTER — Encounter: Payer: Self-pay | Admitting: Family Medicine

## 2015-05-29 VITALS — BP 120/80 | HR 66 | Temp 97.8°F | Ht 71.0 in | Wt 249.4 lb

## 2015-05-29 DIAGNOSIS — I10 Essential (primary) hypertension: Secondary | ICD-10-CM

## 2015-05-29 DIAGNOSIS — E1165 Type 2 diabetes mellitus with hyperglycemia: Secondary | ICD-10-CM | POA: Diagnosis not present

## 2015-05-29 DIAGNOSIS — E669 Obesity, unspecified: Secondary | ICD-10-CM

## 2015-05-29 DIAGNOSIS — E785 Hyperlipidemia, unspecified: Secondary | ICD-10-CM

## 2015-05-29 DIAGNOSIS — IMO0001 Reserved for inherently not codable concepts without codable children: Secondary | ICD-10-CM

## 2015-05-29 DIAGNOSIS — Z0000 Encounter for general adult medical examination without abnormal findings: Secondary | ICD-10-CM | POA: Diagnosis not present

## 2015-05-29 LAB — POCT CBG (FASTING - GLUCOSE)-MANUAL ENTRY: GLUCOSE FASTING, POC: 230 mg/dL — AB (ref 70–99)

## 2015-05-29 MED ORDER — ONETOUCH ULTRASOFT LANCETS MISC: Status: DC

## 2015-05-29 MED ORDER — METFORMIN HCL 500 MG PO TABS: ORAL_TABLET | ORAL | Status: DC

## 2015-05-29 MED ORDER — GLUCOSE BLOOD VI STRP: ORAL_STRIP | Status: DC

## 2015-05-29 MED ORDER — ONETOUCH ULTRA SYSTEM W/DEVICE KIT: 1.0000 | PACK | Freq: Once | Status: DC

## 2015-05-29 NOTE — Addendum Note (Signed)
Addended by: Royann Shivers A on: 05/29/2015 08:26 AM   Modules accepted: Orders

## 2015-05-29 NOTE — Patient Instructions (Addendum)
Your sugar is too high - now uncontrolled diabetes. Start metformin 500mg  daily for 1 week then increase to 500mg  twice daily with meal.  Consider checking sugars fasting in the morning. Glucose meter sent to pharmacy. Check with pharmacist about preferred brand and how to check sugars.  cbg today.  Return in 3 months for diabetes follow up.  Diabetes Mellitus and Food It is important for you to manage your blood sugar (glucose) level. Your blood glucose level can be greatly affected by what you eat. Eating healthier foods in the appropriate amounts throughout the day at about the same time each day will help you control your blood glucose level. It can also help slow or prevent worsening of your diabetes mellitus. Healthy eating may even help you improve the level of your blood pressure and reach or maintain a healthy weight.  General recommendations for healthful eating and cooking habits include:  Eating meals and snacks regularly. Avoid going long periods of time without eating to lose weight.  Eating a diet that consists mainly of plant-based foods, such as fruits, vegetables, nuts, legumes, and whole grains.  Using low-heat cooking methods, such as baking, instead of high-heat cooking methods, such as deep frying. Work with your dietitian to make sure you understand how to use the Nutrition Facts information on food labels. HOW CAN FOOD AFFECT ME? Carbohydrates Carbohydrates affect your blood glucose level more than any other type of food. Your dietitian will help you determine how many carbohydrates to eat at each meal and teach you how to count carbohydrates. Counting carbohydrates is important to keep your blood glucose at a healthy level, especially if you are using insulin or taking certain medicines for diabetes mellitus. Alcohol Alcohol can cause sudden decreases in blood glucose (hypoglycemia), especially if you use insulin or take certain medicines for diabetes mellitus.  Hypoglycemia can be a life-threatening condition. Symptoms of hypoglycemia (sleepiness, dizziness, and disorientation) are similar to symptoms of having too much alcohol.  If your health care provider has given you approval to drink alcohol, do so in moderation and use the following guidelines:  Women should not have more than one drink per day, and men should not have more than two drinks per day. One drink is equal to:  12 oz of beer.  5 oz of wine.  1 oz of hard liquor.  Do not drink on an empty stomach.  Keep yourself hydrated. Have water, diet soda, or unsweetened iced tea.  Regular soda, juice, and other mixers might contain a lot of carbohydrates and should be counted. WHAT FOODS ARE NOT RECOMMENDED? As you make food choices, it is important to remember that all foods are not the same. Some foods have fewer nutrients per serving than other foods, even though they might have the same number of calories or carbohydrates. It is difficult to get your body what it needs when you eat foods with fewer nutrients. Examples of foods that you should avoid that are high in calories and carbohydrates but low in nutrients include:  Trans fats (most processed foods list trans fats on the Nutrition Facts label).  Regular soda.  Juice.  Candy.  Sweets, such as cake, pie, doughnuts, and cookies.  Fried foods. WHAT FOODS CAN I EAT? Eat nutrient-rich foods, which will nourish your body and keep you healthy. The food you should eat also will depend on several factors, including:  The calories you need.  The medicines you take.  Your weight.  Your blood glucose  level.  Your blood pressure level.  Your cholesterol level. You should eat a variety of foods, including:  Protein.  Lean cuts of meat.  Proteins low in saturated fats, such as fish, egg whites, and beans. Avoid processed meats.  Fruits and vegetables.  Fruits and vegetables that may help control blood glucose levels,  such as apples, mangoes, and yams.  Dairy products.  Choose fat-free or low-fat dairy products, such as milk, yogurt, and cheese.  Grains, bread, pasta, and rice.  Choose whole grain products, such as multigrain bread, whole oats, and brown rice. These foods may help control blood pressure.  Fats.  Foods containing healthful fats, such as nuts, avocado, olive oil, canola oil, and fish. DOES EVERYONE WITH DIABETES MELLITUS HAVE THE SAME MEAL PLAN? Because every person with diabetes mellitus is different, there is not one meal plan that works for everyone. It is very important that you meet with a dietitian who will help you create a meal plan that is just right for you.   This information is not intended to replace advice given to you by your health care provider. Make sure you discuss any questions you have with your health care provider.   Document Released: 10/23/2004 Document Revised: 02/16/2014 Document Reviewed: 12/23/2012 Elsevier Interactive Patient Education 2016 [place] Maintenance, Male A healthy lifestyle and preventative care can promote health and wellness.  Maintain regular health, dental, and eye exams.  Eat a healthy diet. Foods like vegetables, fruits, whole grains, low-fat dairy products, and lean protein foods contain the nutrients you need and are low in calories. Decrease your intake of foods high in solid fats, added sugars, and salt. Get information about a proper diet from your health care provider, if necessary.  Regular physical exercise is one of the most important things you can do for your health. Most adults should get at least 150 minutes of moderate-intensity exercise (any activity that increases your heart rate and causes you to sweat) each week. In addition, most adults need muscle-strengthening exercises on 2 or more days a week.   Maintain a healthy weight. The body mass index (BMI) is a screening tool to identify possible weight  problems. It provides an estimate of body fat based on height and weight. Your health care provider can find your BMI and can help you achieve or maintain a healthy weight. For males 20 years and older:  A BMI below 18.5 is considered underweight.  A BMI of 18.5 to 24.9 is normal.  A BMI of 25 to 29.9 is considered overweight.  A BMI of 30 and above is considered obese.  Maintain normal blood lipids and cholesterol by exercising and minimizing your intake of saturated fat. Eat a balanced diet with plenty of fruits and vegetables. Blood tests for lipids and cholesterol should begin at age 57 and be repeated every 5 years. If your lipid or cholesterol levels are high, you are over age 62, or you are at high risk for heart disease, you may need your cholesterol levels checked more frequently.Ongoing high lipid and cholesterol levels should be treated with medicines if diet and exercise are not working.  If you smoke, find out from your health care provider how to quit. If you do not use tobacco, do not start.  Lung cancer screening is recommended for adults aged 19-80 years who are at high risk for developing lung cancer because of a history of smoking. A yearly low-dose CT scan of the lungs  is recommended for people who have at least a 30-pack-year history of smoking and are current smokers or have quit within the past 15 years. A pack year of smoking is smoking an average of 1 pack of cigarettes a day for 1 year (for example, a 30-pack-year history of smoking could mean smoking 1 pack a day for 30 years or 2 packs a day for 15 years). Yearly screening should continue until the smoker has stopped smoking for at least 15 years. Yearly screening should be stopped for people who develop a health problem that would prevent them from having lung cancer treatment.  If you choose to drink alcohol, do not have more than 2 drinks per day. One drink is considered to be 12 oz (360 mL) of beer, 5 oz (150 mL) of  wine, or 1.5 oz (45 mL) of liquor.  Avoid the use of street drugs. Do not share needles with anyone. Ask for help if you need support or instructions about stopping the use of drugs.  High blood pressure causes heart disease and increases the risk of stroke. High blood pressure is more likely to develop in:  People who have blood pressure in the end of the normal range (100-139/85-89 mm Hg).  People who are overweight or obese.  People who are African American.  If you are 17-73 years of age, have your blood pressure checked every 3-5 years. If you are 89 years of age or older, have your blood pressure checked every year. You should have your blood pressure measured twice--once when you are at a hospital or clinic, and once when you are not at a hospital or clinic. Record the average of the two measurements. To check your blood pressure when you are not at a hospital or clinic, you can use:  An automated blood pressure machine at a pharmacy.  A home blood pressure monitor.  If you are 19-53 years old, ask your health care provider if you should take aspirin to prevent heart disease.  Diabetes screening involves taking a blood sample to check your fasting blood sugar level. This should be done once every 3 years after age 48 if you are at a normal weight and without risk factors for diabetes. Testing should be considered at a younger age or be carried out more frequently if you are overweight and have at least 1 risk factor for diabetes.  Colorectal cancer can be detected and often prevented. Most routine colorectal cancer screening begins at the age of 9 and continues through age 70. However, your health care provider may recommend screening at an earlier age if you have risk factors for colon cancer. On a yearly basis, your health care provider may provide home test kits to check for hidden blood in the stool. A small camera at the end of a tube may be used to directly examine the colon  (sigmoidoscopy or colonoscopy) to detect the earliest forms of colorectal cancer. Talk to your health care provider about this at age 38 when routine screening begins. A direct exam of the colon should be repeated every 5-10 years through age 16, unless early forms of precancerous polyps or small growths are found.  People who are at an increased risk for hepatitis B should be screened for this virus. You are considered at high risk for hepatitis B if:  You were born in a country where hepatitis B occurs often. Talk with your health care provider about which countries are considered high risk.  Your parents were born in a high-risk country and you have not received a shot to protect against hepatitis B (hepatitis B vaccine).  You have HIV or AIDS.  You use needles to inject street drugs.  You live with, or have sex with, someone who has hepatitis B.  You are a man who has sex with other men (MSM).  You get hemodialysis treatment.  You take certain medicines for conditions like cancer, organ transplantation, and autoimmune conditions.  Hepatitis C blood testing is recommended for all people born from [birth year] through 1965 and any individual with known risk factors for hepatitis C.  Healthy men should no longer receive prostate-specific antigen (PSA) blood tests as part of routine cancer screening. Talk to your health care provider about prostate cancer screening.  Testicular cancer screening is not recommended for adolescents or adult males who have no symptoms. Screening includes self-exam, a health care provider exam, and other screening tests. Consult with your health care provider about any symptoms you have or any concerns you have about testicular cancer.  Practice safe sex. Use condoms and avoid high-risk sexual practices to reduce the spread of sexually transmitted infections (STIs).  You should be screened for STIs, including gonorrhea and chlamydia if:  You are sexually active and  are younger than 24 years.  You are older than 24 years, and your health care provider tells you that you are at risk for this type of infection.  Your sexual activity has changed since you were last screened, and you are at an increased risk for chlamydia or gonorrhea. Ask your health care provider if you are at risk.  If you are at risk of being infected with HIV, it is recommended that you take a prescription medicine daily to prevent HIV infection. This is called pre-exposure prophylaxis (PrEP). You are considered at risk if:  You are a man who has sex with other men (MSM).  You are a heterosexual man who is sexually active with multiple partners.  You take drugs by injection.  You are sexually active with a partner who has HIV.  Talk with your health care provider about whether you are at high risk of being infected with HIV. If you choose to begin PrEP, you should first be tested for HIV. You should then be tested every 3 months for as long as you are taking PrEP.  Use sunscreen. Apply sunscreen liberally and repeatedly throughout the day. You should seek shade when your shadow is shorter than you. Protect yourself by wearing long sleeves, pants, a wide-brimmed hat, and sunglasses year round whenever you are outdoors.  Tell your health care provider of new moles or changes in moles, especially if there is a change in shape or color. Also, tell your health care provider if a mole is larger than the size of a pencil eraser.  A one-time screening for abdominal aortic aneurysm (AAA) and surgical repair of large AAAs by ultrasound is recommended for men aged 26-75 years who are current or former smokers.  Stay current with your vaccines (immunizations).   This information is not intended to replace advice given to you by your health care provider. Make sure you discuss any questions you have with your health care provider.   Document Released: 07/25/2007 Document Revised: 02/16/2014  Document Reviewed: 06/23/2010 Elsevier Interactive Patient Education [employer].

## 2015-05-29 NOTE — Assessment & Plan Note (Signed)
Preventative protocols reviewed and updated unless pt declined. Discussed healthy diet and lifestyle.  

## 2015-05-29 NOTE — Progress Notes (Signed)
BP 120/80 mmHg  Pulse 66  Temp(Src) 97.8 F (36.6 C) (Oral)  Ht 5\' 11"  (1.803 m)  Wt 249 lb 6.4 oz (113.127 kg)  BMI 34.80 kg/m2  SpO2 97%   CC: CPE  Subjective:    Patient ID: William Johns, male    DOB: Jun 20, 1955, 60 y.o.   MRN: [id number]  HPI: William Johns is a 60 y.o. male presenting on 05/29/2015 for Annual Exam   HLD - compliant with lipitor and fenofibrate and fish oil.  DM - completed DSME. Declines refresher course. Doesn't check sugars at home. Has stopped sodas. Mainly drinks water.   Continued MJ use.   Preventative: Colon cancer screening - 11/2008 2 hyperplastic polyps rec rpt 10 yrs [name])  Prostate cancer screening - discussed - prefers to hold off on screening currently.No prostate cancer hx.Nocturia x1-2.No hesitancy, dribbling or weak stream. Flu yearly pnemovax 2014 Tdap 2013 Seat belt use discussed Sunscreen use discussed. No changing moles on skin.   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 Interim medical history since our last visit reviewed. Allergies and medications reviewed and updated. Current Outpatient Prescriptions on File Prior to Visit  Medication Sig  . aspirin 81 MG tablet Take 81 mg by mouth daily.  Marland Kitchen atorvastatin (LIPITOR) 40 MG tablet TAKE 1 TABLET BY MOUTH ONCE A DAY  . clobetasol cream (TEMOVATE) [id number] % Apply 1 application topically 2 (two) times daily. Apply to AA  . fenofibrate 160 MG tablet TAKE 1 TABLET BY MOUTH EVERY DAY  . HYDROcodone-acetaminophen (NORCO) 5-325 MG per tablet Take 1 tablet by mouth every 6 (six) hours as needed for moderate pain.  Marland Kitchen lisinopril (PRINIVIL,ZESTRIL) 5 MG tablet TAKE 1 TABLET BY MOUTH EVERY DAY  .  Multiple Vitamin (MULTIVITAMIN) tablet Take 1 tablet by mouth daily.  Marland Kitchen omeprazole (PRILOSEC OTC) 20 MG tablet Take 20 mg by mouth daily.  Marland Kitchen VIAGRA 100 MG tablet TAKE 1/2 - 1 TALET BY MOUTH AS NEEDED FOR ERECTILE DYSFUNCTION   No current facility-administered medications on file prior to visit.    Review of Systems  Constitutional: Negative for fever, chills, activity change, appetite change, fatigue and unexpected weight change.  HENT: Negative for hearing loss.   Eyes: Negative for visual disturbance.  Respiratory: Negative for cough, chest tightness, shortness of breath and wheezing.   Cardiovascular: Negative for chest pain, palpitations and leg swelling.  Gastrointestinal: Negative for nausea, vomiting, abdominal pain, diarrhea, constipation, blood in stool and abdominal distention.  Genitourinary: Negative for hematuria and difficulty urinating.  Musculoskeletal: Negative for myalgias, arthralgias and neck pain.  Skin: Negative for rash.  Neurological: Negative for dizziness, seizures, syncope and headaches.  Hematological: Negative for adenopathy. Does not bruise/bleed easily.  Psychiatric/Behavioral: Negative for dysphoric mood. The patient is not nervous/anxious.    Per HPI unless specifically indicated in ROS section     Objective:    BP 120/80 mmHg  Pulse 66  Temp(Src) 97.8 F (36.6 C) (Oral)  Ht 5\' 11"  (1.803 m)  Wt 249 lb 6.4 oz (113.127 kg)  BMI 34.80 kg/m2  SpO2 97%  Wt Readings from Last 3 Encounters:  05/29/15 249 lb 6.4 oz (113.127 kg)  05/22/14 250 lb 12.8 oz (113.762 kg)  11/15/13 246 lb 12 oz (111.925 kg)    Physical Exam  Constitutional: He is oriented to person, place, and time. He appears well-developed and well-nourished. No distress.  HENT:  Head: Normocephalic and atraumatic.  Right Ear: Hearing, tympanic membrane, external ear and ear canal normal.  Left Ear: Hearing, tympanic membrane, external ear and ear canal normal.  Nose: Nose normal.    Mouth/Throat: Uvula is midline, oropharynx is clear and moist and mucous membranes are normal. No oropharyngeal exudate, posterior oropharyngeal edema or posterior oropharyngeal erythema.  Eyes: Conjunctivae and EOM are normal. Pupils are equal, round, and reactive to light. No scleral icterus.  Neck: Normal range of motion. Neck supple. No thyromegaly present.  Cardiovascular: Normal rate, regular rhythm, normal heart sounds and intact distal pulses.   No murmur heard. Pulses:      Radial pulses are 2+ on the right side, and 2+ on the left side.  Pulmonary/Chest: Effort normal and breath sounds normal. No respiratory distress. He has no wheezes. He has no rales.  Abdominal: Soft. Bowel sounds are normal. He exhibits no distension and no mass. There is no tenderness. There is no rebound and no guarding.  Musculoskeletal: Normal range of motion. He exhibits no edema.  Lymphadenopathy:    He has no cervical adenopathy.  Neurological: He is alert and oriented to person, place, and time.  CN grossly intact, station and gait intact  Skin: Skin is warm and dry. No rash noted.  Psychiatric: He has a normal mood and affect. His behavior is normal. Judgment and thought content normal.  Nursing note and vitals reviewed.  Results for orders placed or performed in visit on 05/22/15  Lipid panel  Result Value Ref Range   Cholesterol 148 0 - 200 mg/dL   Triglycerides 363.0 (H) 0.0 - 149.0 mg/dL   HDL 24.90 (L) >39.00 mg/dL   VLDL 72.6 (H) 0.0 - 40.0 mg/dL   Total CHOL/HDL Ratio 6    NonHDL 122.72   Comprehensive metabolic panel  Result Value Ref Range   Sodium 140 135 - 145 mEq/L   Potassium 4.1 3.5 - 5.1 mEq/L   Chloride 104 96 - 112 mEq/L   CO2 31 19 - 32 mEq/L   Glucose, Bld 273 (H) 70 - 99 mg/dL   BUN 19 6 - 23 mg/dL   Creatinine, Ser 1.17 0.40 - 1.50 mg/dL   Total Bilirubin 0.6 0.2 - 1.2 mg/dL   Alkaline Phosphatase 81 39 - 117 U/L   AST 27 0 - 37 U/L   ALT 37 0 - 53 U/L   Total  Protein 6.6 6.0 - 8.3 g/dL   Albumin 4.2 3.5 - 5.2 g/dL   Calcium 9.6 8.4 - 10.5 mg/dL   GFR 67.65 >60.00 mL/min  Hemoglobin A1c  Result Value Ref Range   Hgb A1c MFr Bld 8.7 (H) 4.6 - 6.5 %  PSA  Result Value Ref Range   PSA 1.00 0.10 - 4.00 ng/mL  LDL cholesterol, direct  Result Value Ref Range   Direct LDL 74.0 mg/dL      Assessment & Plan:   Problem List Items Addressed This Visit    HTN (hypertension)    Chronic, stable on low dose lisinopril.      Dyslipidemia    Deteriorated despite regular 3 med regimen. Anticipate due to uncontrolled diabetes. Will work towards better glycemic control then reassess.      Obesity, Class I, BMI 30-34.9    Discussed healthy diet and lifestyle  changes to affect sustainable weight loss      Relevant Medications   metFORMIN (GLUCOPHAGE) 500 MG tablet   Uncontrolled diabetes mellitus type 2 without complications (HCC)    Deteriorated. A1c >8%.  Start metformin 500mg  and titrate to BID with meals.  Discussed diet changes needed to better control sugars.  RTC 3 mo f/u visit.      Relevant Medications   metFORMIN (GLUCOPHAGE) 500 MG tablet   Health maintenance examination - Primary    Preventative protocols reviewed and updated unless pt declined. Discussed healthy diet and lifestyle.           Follow up plan: Return in about 3 months (around 08/28/2015), or as needed, for follow up visit.  Ria Bush, MD

## 2015-05-29 NOTE — Assessment & Plan Note (Signed)
Deteriorated. A1c >8%.  Start metformin 500mg  and titrate to BID with meals.  Discussed diet changes needed to better control sugars.  RTC 3 mo f/u visit.

## 2015-05-29 NOTE — Progress Notes (Signed)
Pre visit review using our clinic review tool, if applicable. No additional management support is needed unless otherwise documented below in the visit note. 

## 2015-05-29 NOTE — Assessment & Plan Note (Signed)
Deteriorated despite regular 3 med regimen. Anticipate due to uncontrolled diabetes. Will work towards better glycemic control then reassess.

## 2015-05-29 NOTE — Assessment & Plan Note (Signed)
Chronic, stable on low dose lisinopril.  

## 2015-05-29 NOTE — Assessment & Plan Note (Signed)
Discussed healthy diet and lifestyle changes to affect sustainable weight loss  

## 2015-06-07 ENCOUNTER — Other Ambulatory Visit: Payer: Self-pay | Admitting: *Deleted

## 2015-07-04 ENCOUNTER — Emergency Department
Admission: EM | Admit: 2015-07-04 | Discharge: 2015-07-04 | Disposition: A | Discharge status: Home / Self Care | Payer: BLUE CROSS/BLUE SHIELD | Attending: Student | Admitting: Student

## 2015-07-04 ENCOUNTER — Encounter: Payer: Self-pay | Admitting: Emergency Medicine

## 2015-07-04 ENCOUNTER — Emergency Department: Payer: BLUE CROSS/BLUE SHIELD

## 2015-07-04 DIAGNOSIS — Z79899 Other long term (current) drug therapy: Secondary | ICD-10-CM | POA: Insufficient documentation

## 2015-07-04 DIAGNOSIS — E119 Type 2 diabetes mellitus without complications: Secondary | ICD-10-CM | POA: Insufficient documentation

## 2015-07-04 DIAGNOSIS — R109 Unspecified abdominal pain: Secondary | ICD-10-CM

## 2015-07-04 DIAGNOSIS — R52 Pain, unspecified: Secondary | ICD-10-CM

## 2015-07-04 DIAGNOSIS — Z8719 Personal history of other diseases of the digestive system: Secondary | ICD-10-CM | POA: Insufficient documentation

## 2015-07-04 DIAGNOSIS — N201 Calculus of ureter: Secondary | ICD-10-CM | POA: Diagnosis not present

## 2015-07-04 DIAGNOSIS — Z87891 Personal history of nicotine dependence: Secondary | ICD-10-CM | POA: Diagnosis not present

## 2015-07-04 DIAGNOSIS — I10 Essential (primary) hypertension: Secondary | ICD-10-CM | POA: Diagnosis not present

## 2015-07-04 DIAGNOSIS — R112 Nausea with vomiting, unspecified: Secondary | ICD-10-CM | POA: Diagnosis not present

## 2015-07-04 DIAGNOSIS — E785 Hyperlipidemia, unspecified: Secondary | ICD-10-CM | POA: Insufficient documentation

## 2015-07-04 DIAGNOSIS — N132 Hydronephrosis with renal and ureteral calculous obstruction: Secondary | ICD-10-CM | POA: Diagnosis not present

## 2015-07-04 DIAGNOSIS — Z7984 Long term (current) use of oral hypoglycemic drugs: Secondary | ICD-10-CM | POA: Insufficient documentation

## 2015-07-04 DIAGNOSIS — Z7982 Long term (current) use of aspirin: Secondary | ICD-10-CM | POA: Insufficient documentation

## 2015-07-04 LAB — HEPATIC FUNCTION PANEL
ALBUMIN: 4.4 g/dL (ref 3.5–5.0)
ALK PHOS: 67 U/L (ref 38–126)
ALT: 47 U/L (ref 17–63)
AST: 39 U/L (ref 15–41)
BILIRUBIN DIRECT: 0.2 mg/dL (ref 0.1–0.5)
BILIRUBIN TOTAL: 0.8 mg/dL (ref 0.3–1.2)
Indirect Bilirubin: 0.6 mg/dL (ref 0.3–0.9)
Total Protein: 7.5 g/dL (ref 6.5–8.1)

## 2015-07-04 LAB — URINALYSIS COMPLETE WITH MICROSCOPIC (ARMC ONLY)
BACTERIA UA: NONE SEEN
Bilirubin Urine: NEGATIVE
Glucose, UA: 50 mg/dL — AB
Ketones, ur: NEGATIVE mg/dL
Leukocytes, UA: NEGATIVE
Nitrite: NEGATIVE
PH: 6 (ref 5.0–8.0)
PROTEIN: 30 mg/dL — AB
SPECIFIC GRAVITY, URINE: 1.02 (ref 1.005–1.030)
Squamous Epithelial / LPF: NONE SEEN

## 2015-07-04 LAB — BASIC METABOLIC PANEL
ANION GAP: 10 (ref 5–15)
BUN: 26 mg/dL — AB (ref 6–20)
CALCIUM: 9.7 mg/dL (ref 8.9–10.3)
CO2: 22 mmol/L (ref 22–32)
CREATININE: 1.04 mg/dL (ref 0.61–1.24)
Chloride: 109 mmol/L (ref 101–111)
GFR calc Af Amer: >60 mL/min (ref >60)
GLUCOSE: 189 mg/dL — AB (ref 65–99)
Potassium: 4 mmol/L (ref 3.5–5.1)
Sodium: 141 mmol/L (ref 135–145)

## 2015-07-04 LAB — CBC
HEMATOCRIT: 47.6 % (ref 40.0–52.0)
HEMOGLOBIN: 16.2 g/dL (ref 13.0–18.0)
MCH: 30.2 pg (ref 26.0–34.0)
MCHC: 34 g/dL (ref 32.0–36.0)
MCV: 88.9 fL (ref 80.0–100.0)
Platelets: 189 10*3/uL (ref 150–440)
RBC: 5.35 MIL/uL (ref 4.40–5.90)
RDW: 13.2 % (ref 11.5–14.5)
WBC: 12.1 10*3/uL — AB (ref 3.8–10.6)

## 2015-07-04 MED ORDER — ONDANSETRON 4 MG PO TBDP: 4.0000 mg | ORAL_TABLET | Freq: Three times a day (TID) | ORAL | Status: DC | PRN

## 2015-07-04 MED ORDER — ONDANSETRON HCL 4 MG/2ML IJ SOLN
4.0000 mg | Freq: Once | INTRAMUSCULAR | Status: AC
  Administered 2015-07-04: 4 mg via INTRAVENOUS
  Filled 2015-07-04: qty 2

## 2015-07-04 MED ORDER — HYDROCODONE-ACETAMINOPHEN 5-325 MG PO TABS: 1.0000 | ORAL_TABLET | Freq: Four times a day (QID) | ORAL | Status: DC | PRN

## 2015-07-04 MED ORDER — KETOROLAC TROMETHAMINE 30 MG/ML IJ SOLN
15.0000 mg | Freq: Once | INTRAMUSCULAR | Status: AC
  Administered 2015-07-04: 15 mg via INTRAVENOUS
  Filled 2015-07-04: qty 1

## 2015-07-04 MED ORDER — TAMSULOSIN HCL 0.4 MG PO CAPS: 0.4000 mg | ORAL_CAPSULE | Freq: Every day | ORAL | Status: DC

## 2015-07-04 MED ORDER — SODIUM CHLORIDE 0.9 % IV BOLUS (SEPSIS)
1000.0000 mL | Freq: Once | INTRAVENOUS | Status: AC
  Administered 2015-07-04: 1000 mL via INTRAVENOUS

## 2015-07-04 NOTE — ED Provider Notes (Signed)
[facility] Emergency Department Provider Note   ____________________________________________  Time seen: Approximately 8:14 PM  I have reviewed the triage vital signs and the nursing notes.   HISTORY  Chief Complaint Flank Pain    HPI William Johns is a 60 y.o. male with history of hypertension, hyperlipidemia, history of kidney stones, diabetes who presents for evaluation of 2 days of intermittent left flank pain, gradual onset, now constant and severe, no modifying factors. He has also had some nonbloody nonbilious emesis today. No diarrhea, no fevers or chills. No chest pain or difficulty breathing. He reports that this in some ways feels similar to his prior kidney stones but in some ways feels different as he also has some tenderness/pain throughout the left abdomen which he previously did not have.   Past Medical History  Diagnosis Date  . HTN (hypertension)   . HLD (hyperlipidemia)   . History of kidney stones 2013    s/p lithotripsy  . ED (erectile dysfunction)   . Obesity   . GERD (gastroesophageal reflux disease)   . T2DM (type 2 diabetes mellitus) ([place])     DSME at [facility] 01/2012    Patient Active Problem List   Diagnosis Date Noted  . Health maintenance examination 05/15/2013  . Eczema 11/11/2012  . Uncontrolled diabetes mellitus type 2 without complications ([place])   . SK (seborrheic keratosis) 11/25/2011  . Plantar fasciitis 11/25/2011  . HTN (hypertension)   . Dyslipidemia   . History of kidney stones   . ED (erectile dysfunction)   . GERD (gastroesophageal reflux disease)   . Obesity, Class I, BMI 30-34.9     Past Surgical History  Procedure Laterality Date  . Cholecystectomy  2007  . Lithotripsy  2013    x 2  . Colonoscopy  11/2008    2 hyperplastic polyps [name])    Current Outpatient Rx  Name  Route  Sig  Dispense  Refill  . aspirin 81 MG tablet   Oral   Take 81 mg by mouth daily.         Marland Kitchen atorvastatin (LIPITOR)  40 MG tablet      TAKE 1 TABLET BY MOUTH ONCE A DAY   90 tablet   1   . Blood Glucose Monitoring Suppl (BAYER CONTOUR MONITOR) w/Device KIT   Does not apply   by Does not apply route. Use to check sugar daily and as needed Dx: E11.65         . clobetasol cream (TEMOVATE) 0.05 %   Topical   Apply 1 application topically 2 (two) times daily. Apply to AA   45 g   0   . fenofibrate 160 MG tablet      TAKE 1 TABLET BY MOUTH EVERY DAY   30 tablet   3   . glucose blood test strip   Other   1 each by Other route as needed for other. Use as instructed to check sugar once daily and as needed. E11.65 **Contour**         . HYDROcodone-acetaminophen (NORCO) 5-325 MG per tablet   Oral   Take 1 tablet by mouth every 6 (six) hours as needed for moderate pain.   30 tablet   0   . Lancets MISC   Does not apply   by Does not apply route. Use as directed to check sugar once daily and as needed. Dx. E11.65 **Contour**         . lisinopril (PRINIVIL,ZESTRIL)  5 MG tablet      TAKE 1 TABLET BY MOUTH EVERY DAY   30 tablet   3   . metFORMIN (GLUCOPHAGE) 500 MG tablet      Take one tablet with breakfast daily for 1 week then increase to twice daily with meal.   180 tablet   3   . Multiple Vitamin (MULTIVITAMIN) tablet   Oral   Take 1 tablet by mouth daily.         . OMEGA-3 FATTY ACIDS PO   Oral   Take 1 capsule by mouth daily.         Marland Kitchen omeprazole (PRILOSEC OTC) 20 MG tablet   Oral   Take 20 mg by mouth daily.         Marland Kitchen VIAGRA 100 MG tablet      TAKE 1/2 - 1 TALET BY MOUTH AS NEEDED FOR ERECTILE DYSFUNCTION   4 tablet   1     Allergies Oxycodone  Family History  Problem Relation Age of Onset  . Cancer Father 87    lung, smoker  . Aneurysm Mother 68    brain  . Coronary artery disease Neg Hx   . Stroke Neg Hx   . Diabetes Neg Hx     Social History Social History  Substance Use Topics  . Smoking status: Former Smoker    Quit date: 02/10/2007  .  Smokeless tobacco: Never Used  . Alcohol Use: 0.0 oz/week    0 Standard drinks or equivalent per week     Comment: 5 drinks/week     Review of Systems Constitutional: No fever/chills Eyes: No visual changes. ENT: No sore throat. Cardiovascular: Denies chest pain. Respiratory: Denies shortness of breath. Gastrointestinal: + abdominal pain.  + nausea, + vomiting.  No diarrhea.  No constipation. Genitourinary: Negative for dysuria. Musculoskeletal: Positive for left flank pain. Skin: Negative for rash. Neurological: Negative for headaches, focal weakness or numbness.  10-point ROS otherwise negative.  ____________________________________________   PHYSICAL EXAM:  VITAL SIGNS: ED Triage Vitals  Enc Vitals Group     BP 07/04/15 1821 163/87 mmHg     Pulse Rate 07/04/15 1821 79     Resp 07/04/15 1821 20     Temp 07/04/15 1821 97.8 F (36.6 C)     Temp Source 07/04/15 1821 Oral     SpO2 07/04/15 1821 95 %     Weight 07/04/15 1821 245 lb (111.131 kg)     Height 07/04/15 1821 5' 11"  (1.803 m)     Head Cir --      Peak Flow --      Pain Score --      Pain Loc --      Pain Edu? --      Excl. in [place]? --     Constitutional: Alert and oriented. Nontoxic-appearing, in mild distress due to pain. Eyes: Conjunctivae are normal. PERRL. EOMI. Head: Atraumatic. Nose: No congestion/rhinnorhea. Mouth/Throat: Mucous membranes are moist.  Oropharynx non-erythematous. Neck: No stridor.  Supple without meningismus. Cardiovascular: Normal rate, regular rhythm. Grossly normal heart sounds.  Good peripheral circulation. Respiratory: Normal respiratory effort.  No retractions. Lungs CTAB. Gastrointestinal: Soft with tenderness to palpation throughout the left abdomen. No distention.  No CVA tenderness. Genitourinary: deferred Musculoskeletal: No lower extremity tenderness nor edema.  No joint effusions. Neurologic:  Normal speech and language. No gross focal neurologic deficits are  appreciated. No gait instability. Skin:  Skin is warm, dry and intact. No rash noted.  Psychiatric: Mood and affect are normal. Speech and behavior are normal.  ____________________________________________   LABS (all labs ordered are listed, but only abnormal results are displayed)  Labs Reviewed  URINALYSIS COMPLETEWITH MICROSCOPIC ([facility] ONLY) - Abnormal; Notable for the following:    Color, Urine YELLOW (*)    APPearance CLEAR (*)    Glucose, UA 50 (*)    Hgb urine dipstick 3+ (*)    Protein, ur 30 (*)    All other components within normal limits  BASIC METABOLIC PANEL - Abnormal; Notable for the following:    Glucose, Bld 189 (*)    BUN 26 (*)    All other components within normal limits  CBC - Abnormal; Notable for the following:    WBC 12.1 (*)    All other components within normal limits  HEPATIC FUNCTION PANEL   ____________________________________________  EKG  none ____________________________________________  RADIOLOGY  CT abdomen and pelvis, renal protocol IMPRESSION: 1. Moderate left-sided hydronephrosis, with an obstructing 4 x 2 mm stone in the proximal left ureter, just below the left renal pelvis. 2. Scattered nonobstructing bilateral renal stones, measuring up to 5 mm in size. 3. Moderate left renal atrophy noted. 4. Diffuse fatty infiltration within the liver. 5. Scattered calcification along the abdominal aorta and its branches. 6. Mild diverticulosis along the descending and proximal sigmoid colon, without evidence of diverticulitis. ____________________________________________   PROCEDURES  Procedure(s) performed: None  Critical Care performed: No  ____________________________________________   INITIAL IMPRESSION / ASSESSMENT AND PLAN / ED COURSE  Pertinent labs & imaging results that were available during my care of the patient were reviewed by me and considered in my medical decision making (see chart for details).  William Johns is  a 60 y.o. male with history of hypertension, hyperlipidemia, history of kidney stones, diabetes who presents for evaluation of 2 days of intermittent left flank pain Also with nausea and vomiting today. On exam he is mildly uncomfortable secondary to pain, his vital signs are stable, he is afebrile. He has significant tenderness to palpation throughout the left abdomen. We'll obtain screening labs, urinalysis, likely CT of the abdomen and pelvis as he reports that this episode of flank pain is dissimilar from his usual presentations for kidney stones because he is having abdominal pain/tenderness. Reassess for disposition.  ----------------------------------------- 10:32 PM on 07/04/2015 ----------------------------------------- Labs reviewed. CBC is notable for mild leukocytosis, white blood cell count is 12.1. Unremarkable CMP, normal creatinine. Urinalysis with many red blood cells, minimal white blood cells, negative leukocytes, negative nitrites, no bacteria, and does not appear infected. CT scan shows obstructing 4 x 2 mm left proximal ureteral stone which is the most likely cause of his pain. Patient reports he feels much better at this time, he is sitting up in bed, textingng on his smartphone, tolerating by mouth intake without vomiting. We'll discharge with expectant management. We discussed return precautions, need for close urology follow-up and he is comfortable with the discharge plan. DC home.  ____________________________________________   FINAL CLINICAL IMPRESSION(S) / ED DIAGNOSES  Final diagnoses:  Pain  Acute left flank pain  Ureterolithiasis      NEW MEDICATIONS STARTED DURING THIS VISIT:  New Prescriptions   No medications on file     Note:  This document was prepared using Dragon voice recognition software and may include unintentional dictation errors.    Joanne Gavel, MD 07/04/15 2234

## 2015-07-04 NOTE — ED Notes (Signed)
Pt presents to ED via POV with c/o kidney stones. Pt states he has hx of kidney stones at this time. Pt c/o L flank pain. Pt denies urinary symptoms and states urine is clear at this time.

## 2015-08-21 ENCOUNTER — Other Ambulatory Visit: Payer: Self-pay | Admitting: Family Medicine

## 2015-08-21 DIAGNOSIS — E785 Hyperlipidemia, unspecified: Secondary | ICD-10-CM

## 2015-08-21 DIAGNOSIS — IMO0001 Reserved for inherently not codable concepts without codable children: Secondary | ICD-10-CM

## 2015-08-21 DIAGNOSIS — E1165 Type 2 diabetes mellitus with hyperglycemia: Secondary | ICD-10-CM

## 2015-08-22 ENCOUNTER — Other Ambulatory Visit: Payer: BLUE CROSS/BLUE SHIELD

## 2015-08-25 ENCOUNTER — Other Ambulatory Visit: Payer: Self-pay | Admitting: Family Medicine

## 2015-08-28 ENCOUNTER — Ambulatory Visit: Payer: BLUE CROSS/BLUE SHIELD | Admitting: Family Medicine

## 2015-09-11 ENCOUNTER — Ambulatory Visit (INDEPENDENT_AMBULATORY_CARE_PROVIDER_SITE_OTHER): Payer: BLUE CROSS/BLUE SHIELD | Admitting: Family Medicine

## 2015-09-11 ENCOUNTER — Encounter: Payer: Self-pay | Admitting: Family Medicine

## 2015-09-11 VITALS — BP 124/82 | HR 72 | Temp 98.1°F | Wt 232.5 lb

## 2015-09-11 DIAGNOSIS — E1165 Type 2 diabetes mellitus with hyperglycemia: Secondary | ICD-10-CM

## 2015-09-11 DIAGNOSIS — N529 Male erectile dysfunction, unspecified: Secondary | ICD-10-CM

## 2015-09-11 DIAGNOSIS — IMO0001 Reserved for inherently not codable concepts without codable children: Secondary | ICD-10-CM

## 2015-09-11 DIAGNOSIS — E785 Hyperlipidemia, unspecified: Secondary | ICD-10-CM

## 2015-09-11 DIAGNOSIS — E669 Obesity, unspecified: Secondary | ICD-10-CM | POA: Diagnosis not present

## 2015-09-11 LAB — HEMOGLOBIN A1C: Hgb A1c MFr Bld: 6.3 % (ref 4.6–6.5)

## 2015-09-11 LAB — LIPID PANEL
Cholesterol: 155 mg/dL (ref 0–200)
HDL: 33.1 mg/dL — ABNORMAL LOW (ref >39.00)
LDL CALC: 95 mg/dL (ref 0–99)
NONHDL: 122.17
Total CHOL/HDL Ratio: 5
Triglycerides: 135 mg/dL (ref 0.0–149.0)
VLDL: 27 mg/dL (ref 0.0–40.0)

## 2015-09-11 MED ORDER — SILDENAFIL CITRATE 100 MG PO TABS: ORAL_TABLET | ORAL | 6 refills | Status: DC

## 2015-09-11 NOTE — Assessment & Plan Note (Signed)
viagra refilled.  

## 2015-09-11 NOTE — Progress Notes (Signed)
BP 124/82   Pulse 72   Temp 98.1 F (36.7 C) (Oral)   Wt 232 lb 8 oz (105.5 kg)   BMI 32.43 kg/m    CC: 42mof/u visit Subjective:    Patient ID: William Johns male    DOB: 724-Apr-1957 60y.o.   MRN: [id number] HPI: William Golebiewskiis a 60y.o. male presenting on 09/11/2015 for Follow-up   Last visit seen here with deteriorated diabetes control with A1c 8.7%. Started on metformin 5046mbid. Here for 3 mo f/u visit. He did complete DSME. Regularly does check sugars and brings log 110-140 fasting. Compliant with antihyperglycemic regimen which includes: metformin 50065mid. Denies low sugars or hypoglycemic symptoms. Denies paresthesias. Last diabetic eye exam DUE - has upcoming appt. Pneumovax: 2014. Prevnar: not due. Foot exam today. Sporadic schedule when he's working.  Lab Results  Component Value Date   HGBA1C 8.7 (H) 05/22/2015   Diabetic Foot Exam - Simple   Simple Foot Form Diabetic Foot exam was performed with the following findings:  Yes 09/11/2015  7:54 AM  Visual Inspection No deformities, no ulcerations, no other skin breakdown bilaterally:  Yes Sensation Testing Intact to touch and monofilament testing bilaterally:  Yes Pulse Check Posterior Tibialis and Dorsalis pulse intact bilaterally:  Yes Comments    He has lost 13 lbs. Increasing salmon and watching portion sizes.   Relevant past medical, surgical, family and social history reviewed and updated as indicated. Interim medical history since our last visit reviewed. Allergies and medications reviewed and updated. Current Outpatient Prescriptions on File Prior to Visit  Medication Sig  . aspirin 81 MG tablet Take 81 mg by mouth daily.  . aMarland Kitchenorvastatin (LIPITOR) 40 MG tablet TAKE 1 TABLET BY MOUTH ONCE A DAY  . Blood Glucose Monitoring Suppl (BAYER CONTOUR MONITOR) w/Device KIT by Does not apply route. Use to check sugar daily and as needed Dx: E11.65  . clobetasol cream (TEMOVATE) 0.04.16Apply 1 application topically 2  (two) times daily. Apply to AA  . fenofibrate 160 MG tablet TAKE 1 TABLET BY MOUTH EVERY DAY  . glucose blood test strip 1 each by Other route as needed for other. Use as instructed to check sugar once daily and as needed. E11.65 **Contour**  . HYDROcodone-acetaminophen (NORCO/VICODIN) 5-325 MG tablet Take 1 tablet by mouth every 6 (six) hours as needed for moderate pain. Do not drive while taking this medication.  . Lancets MISC by Does not apply route. Use as directed to check sugar once daily and as needed. Dx. E11.65 **Contour**  . lisinopril (PRINIVIL,ZESTRIL) 5 MG tablet TAKE 1 TABLET BY MOUTH EVERY DAY  . metFORMIN (GLUCOPHAGE) 500 MG tablet Take one tablet with breakfast daily for 1 week then increase to twice daily with meal.  . Multiple Vitamin (MULTIVITAMIN) tablet Take 1 tablet by mouth daily.  . OMEGA-3 FATTY ACIDS PO Take 1 capsule by mouth daily.  . oMarland Kitcheneprazole (PRILOSEC OTC) 20 MG tablet Take 20 mg by mouth daily.  . ondansetron (ZOFRAN ODT) 4 MG disintegrating tablet Take 1 tablet (4 mg total) by mouth every 8 (eight) hours as needed for nausea or vomiting.  . tamsulosin (FLOMAX) 0.4 MG CAPS capsule Take 1 capsule (0.4 mg total) by mouth daily.  . HMarland KitchenDROcodone-acetaminophen (NORCO) 5-325 MG per tablet Take 1 tablet by mouth every 6 (six) hours as needed for moderate pain. (Patient not taking: Reported on 09/11/2015)   No current facility-administered medications on file prior to visit.  Review of Systems Per HPI unless specifically indicated in ROS section     Objective:    BP 124/82   Pulse 72   Temp 98.1 F (36.7 C) (Oral)   Wt 232 lb 8 oz (105.5 kg)   BMI 32.43 kg/m   Wt Readings from Last 3 Encounters:  09/11/15 232 lb 8 oz (105.5 kg)  07/04/15 245 lb (111.1 kg)  05/29/15 249 lb 6.4 oz (113.1 kg)    Physical Exam  Constitutional: He appears well-developed and well-nourished. No distress.  HENT:  Head: Normocephalic and atraumatic.  Right Ear: External ear  normal.  Left Ear: External ear normal.  Nose: Nose normal.  Mouth/Throat: Oropharynx is clear and moist. No oropharyngeal exudate.  Eyes: Conjunctivae and EOM are normal. Pupils are equal, round, and reactive to light. No scleral icterus.  Neck: Normal range of motion. Neck supple.  Cardiovascular: Normal rate, regular rhythm, normal heart sounds and intact distal pulses.   No murmur heard. Pulmonary/Chest: Effort normal and breath sounds normal. No respiratory distress. He has no wheezes. He has no rales.  Musculoskeletal: He exhibits no edema.  See HPI for foot exam if done  Lymphadenopathy:    He has no cervical adenopathy.  Skin: Skin is warm and dry. No rash noted.  Psychiatric: He has a normal mood and affect.  Nursing note and vitals reviewed.  Results for orders placed or performed during the hospital encounter of 07/04/15  Urinalysis complete, with microscopic- may I&O cath if menses  Result Value Ref Range   Color, Urine YELLOW (A) YELLOW   APPearance CLEAR (A) CLEAR   Glucose, UA 50 (A) NEGATIVE mg/dL   Bilirubin Urine NEGATIVE NEGATIVE   Ketones, ur NEGATIVE NEGATIVE mg/dL   Specific Gravity, Urine 1.020 1.005 - 1.030   Hgb urine dipstick 3+ (A) NEGATIVE   pH 6.0 5.0 - 8.0   Protein, ur 30 (A) NEGATIVE mg/dL   Nitrite NEGATIVE NEGATIVE   Leukocytes, UA NEGATIVE NEGATIVE   RBC / HPF TOO NUMEROUS TO COUNT 0 - 5 RBC/hpf   WBC, UA 0-5 0 - 5 WBC/hpf   Bacteria, UA NONE SEEN NONE SEEN   Squamous Epithelial / LPF NONE SEEN NONE SEEN   Mucous PRESENT   Basic metabolic panel  Result Value Ref Range   Sodium 141 135 - 145 mmol/L   Potassium 4.0 3.5 - 5.1 mmol/L   Chloride 109 101 - 111 mmol/L   CO2 22 22 - 32 mmol/L   Glucose, Bld 189 (H) 65 - 99 mg/dL   BUN 26 (H) 6 - 20 mg/dL   Creatinine, Ser 1.04 0.61 - 1.24 mg/dL   Calcium 9.7 8.9 - 10.3 mg/dL   GFR calc non Af Amer >60 >60 mL/min   GFR calc Af Amer >60 >60 mL/min   Anion gap 10 5 - 15  CBC  Result Value Ref  Range   WBC 12.1 (H) 3.8 - 10.6 K/uL   RBC 5.35 4.40 - 5.90 MIL/uL   Hemoglobin 16.2 13.0 - 18.0 g/dL   HCT 47.6 40.0 - 52.0 %   MCV 88.9 80.0 - 100.0 fL   MCH 30.2 26.0 - 34.0 pg   MCHC 34.0 32.0 - 36.0 g/dL   RDW 13.2 11.5 - 14.5 %   Platelets 189 150 - 440 K/uL  Hepatic function panel  Result Value Ref Range   Total Protein 7.5 6.5 - 8.1 g/dL   Albumin 4.4 3.5 - 5.0 g/dL   AST  39 15 - 41 U/L   ALT 47 17 - 63 U/L   Alkaline Phosphatase 67 38 - 126 U/L   Total Bilirubin 0.8 0.3 - 1.2 mg/dL   Bilirubin, Direct 0.2 0.1 - 0.5 mg/dL   Indirect Bilirubin 0.6 0.3 - 0.9 mg/dL      Assessment & Plan:   Problem List Items Addressed This Visit    Dyslipidemia    Recheck FLP - continue statin, tricor, fish oil.       ED (erectile dysfunction)    viagra refilled.       Obesity, Class I, BMI 30-34.9    Congratulated on weight loss to date - down 13 lbs.      Uncontrolled diabetes mellitus type 2 without complications (HCC) - Primary    Recheck A1c. Pt endorses some improvement in glycemic control. Discussed importance of scheduled meals.  Suggested he start cinnamon supplement daily.       Relevant Orders   Hemoglobin A1c    Other Visit Diagnoses   None.      Follow up plan: Return in about 8 months (around 05/11/2016), or as needed, for annual exam, prior fasting for blood work.  Javier Gutierrez, MD   

## 2015-09-11 NOTE — Assessment & Plan Note (Signed)
Congratulated on weight loss to date - down 13 lbs.

## 2015-09-11 NOTE — Assessment & Plan Note (Addendum)
Recheck A1c. Pt endorses some improvement in glycemic control. Discussed importance of scheduled meals.  Suggested he start cinnamon supplement daily.

## 2015-09-11 NOTE — Assessment & Plan Note (Signed)
Recheck FLP - continue statin, tricor, fish oil.

## 2015-09-11 NOTE — Progress Notes (Signed)
Pre visit review using our clinic review tool, if applicable. No additional management support is needed unless otherwise documented below in the visit note. 

## 2015-09-11 NOTE — Patient Instructions (Addendum)
Schedule eye exam at your convenience.  Schedule your meals as sugars will be easier to control this way.  Consider starting cinnamon supplemnet. Labs today.

## 2015-12-25 ENCOUNTER — Other Ambulatory Visit: Payer: Self-pay | Admitting: Family Medicine

## 2016-03-07 ENCOUNTER — Other Ambulatory Visit: Payer: Self-pay | Admitting: Family Medicine

## 2016-05-02 ENCOUNTER — Other Ambulatory Visit: Payer: Self-pay | Admitting: Family Medicine

## 2016-05-04 ENCOUNTER — Ambulatory Visit: Payer: BLUE CROSS/BLUE SHIELD | Admitting: Family Medicine

## 2016-05-26 ENCOUNTER — Other Ambulatory Visit: Payer: Self-pay | Admitting: Family Medicine

## 2016-05-26 ENCOUNTER — Other Ambulatory Visit (INDEPENDENT_AMBULATORY_CARE_PROVIDER_SITE_OTHER): Payer: BLUE CROSS/BLUE SHIELD

## 2016-05-26 DIAGNOSIS — Z125 Encounter for screening for malignant neoplasm of prostate: Secondary | ICD-10-CM

## 2016-05-26 DIAGNOSIS — E785 Hyperlipidemia, unspecified: Secondary | ICD-10-CM

## 2016-05-26 DIAGNOSIS — E1165 Type 2 diabetes mellitus with hyperglycemia: Secondary | ICD-10-CM | POA: Diagnosis not present

## 2016-05-26 DIAGNOSIS — IMO0001 Reserved for inherently not codable concepts without codable children: Secondary | ICD-10-CM

## 2016-05-26 LAB — COMPREHENSIVE METABOLIC PANEL
ALBUMIN: 4.2 g/dL (ref 3.5–5.2)
ALK PHOS: 48 U/L (ref 39–117)
ALT: 23 U/L (ref 0–53)
AST: 20 U/L (ref 0–37)
BILIRUBIN TOTAL: 0.7 mg/dL (ref 0.2–1.2)
BUN: 23 mg/dL (ref 6–23)
CALCIUM: 9.3 mg/dL (ref 8.4–10.5)
CHLORIDE: 105 meq/L (ref 96–112)
CO2: 29 mEq/L (ref 19–32)
CREATININE: 1.22 mg/dL (ref 0.40–1.50)
GFR: 64.24 mL/min (ref >60.00)
Glucose, Bld: 166 mg/dL — ABNORMAL HIGH (ref 70–99)
Potassium: 3.8 mEq/L (ref 3.5–5.1)
Sodium: 140 mEq/L (ref 135–145)
TOTAL PROTEIN: 6.4 g/dL (ref 6.0–8.3)

## 2016-05-26 LAB — LIPID PANEL
Cholesterol: 153 mg/dL (ref 0–200)
HDL: 25.9 mg/dL — ABNORMAL LOW (ref >39.00)
LDL CALC: 91 mg/dL (ref 0–99)
NonHDL: 127.38
Total CHOL/HDL Ratio: 6
Triglycerides: 180 mg/dL — ABNORMAL HIGH (ref 0.0–149.0)
VLDL: 36 mg/dL (ref 0.0–40.0)

## 2016-05-26 LAB — HEMOGLOBIN A1C: HEMOGLOBIN A1C: 6.7 % — AB (ref 4.6–6.5)

## 2016-05-26 LAB — PSA: PSA: 1.27 ng/mL (ref 0.10–4.00)

## 2016-06-01 ENCOUNTER — Other Ambulatory Visit: Payer: Self-pay | Admitting: Family Medicine

## 2016-06-02 ENCOUNTER — Ambulatory Visit (INDEPENDENT_AMBULATORY_CARE_PROVIDER_SITE_OTHER): Payer: BLUE CROSS/BLUE SHIELD | Admitting: Family Medicine

## 2016-06-02 ENCOUNTER — Encounter: Payer: Self-pay | Admitting: Family Medicine

## 2016-06-02 VITALS — BP 162/98 | HR 80 | Temp 98.2°F | Ht 71.0 in | Wt 244.2 lb

## 2016-06-02 DIAGNOSIS — N529 Male erectile dysfunction, unspecified: Secondary | ICD-10-CM

## 2016-06-02 DIAGNOSIS — R1011 Right upper quadrant pain: Secondary | ICD-10-CM

## 2016-06-02 DIAGNOSIS — K219 Gastro-esophageal reflux disease without esophagitis: Secondary | ICD-10-CM | POA: Diagnosis not present

## 2016-06-02 DIAGNOSIS — R0600 Dyspnea, unspecified: Secondary | ICD-10-CM | POA: Diagnosis not present

## 2016-06-02 DIAGNOSIS — I10 Essential (primary) hypertension: Secondary | ICD-10-CM | POA: Diagnosis not present

## 2016-06-02 DIAGNOSIS — E669 Obesity, unspecified: Secondary | ICD-10-CM | POA: Diagnosis not present

## 2016-06-02 DIAGNOSIS — Z0001 Encounter for general adult medical examination with abnormal findings: Secondary | ICD-10-CM | POA: Diagnosis not present

## 2016-06-02 DIAGNOSIS — E785 Hyperlipidemia, unspecified: Secondary | ICD-10-CM

## 2016-06-02 DIAGNOSIS — E119 Type 2 diabetes mellitus without complications: Secondary | ICD-10-CM

## 2016-06-02 MED ORDER — SILDENAFIL CITRATE 20 MG PO TABS: 40.0000 mg | ORAL_TABLET | Freq: Every day | ORAL | 3 refills | Status: DC | PRN

## 2016-06-02 MED ORDER — FENOFIBRATE 160 MG PO TABS: 160.0000 mg | ORAL_TABLET | Freq: Every day | ORAL | 3 refills | Status: DC

## 2016-06-02 MED ORDER — HYDROCODONE-ACETAMINOPHEN 5-325 MG PO TABS: 1.0000 | ORAL_TABLET | Freq: Four times a day (QID) | ORAL | 0 refills | Status: DC | PRN

## 2016-06-02 MED ORDER — LISINOPRIL 10 MG PO TABS: 10.0000 mg | ORAL_TABLET | Freq: Every day | ORAL | 3 refills | Status: DC

## 2016-06-02 MED ORDER — ATORVASTATIN CALCIUM 40 MG PO TABS: 40.0000 mg | ORAL_TABLET | Freq: Every day | ORAL | 3 refills | Status: DC

## 2016-06-02 NOTE — Assessment & Plan Note (Signed)
Discussed healthy diet and lifestyle changes to affect sustainable weight loss  

## 2016-06-02 NOTE — Assessment & Plan Note (Addendum)
Does not describe true pain. No masses on exam.  s/p cholecystectomy.  Large scar RUQ ?scar tissue related. ? CMP WNL. Check abd [place] to eval fatty liver vs other cause.  No BM changes so doubt intestine related.

## 2016-06-02 NOTE — Assessment & Plan Note (Signed)
Chronic, stable on current 3 drug regimen (lipitor, fibrate, fish oil).

## 2016-06-02 NOTE — Patient Instructions (Addendum)
Schedule eye doctor appointment as you're due Increase lisinopril to 10mg  daily. Start monitoring blood pressure at home and let me know if consistently >140/90. Work on low salt diet, drink plenty of water, get aerobic exercise into routine.  Generic viagra (sildenafil) sent to pharmacy.  We will schedule abdominal ultrasound.  Return in 4 months for follow up.  EKG today.   Health Maintenance, Male A healthy lifestyle and preventive care is important for your health and wellness. Ask your health care provider about what schedule of regular examinations is right for you. What should I know about weight and diet?  Eat a Healthy Diet  Eat plenty of vegetables, fruits, whole grains, low-fat dairy products, and lean protein.  Do not eat a lot of foods high in solid fats, added sugars, or salt. Maintain a Healthy Weight  Regular exercise can help you achieve or maintain a healthy weight. You should:  Do at least 150 minutes of exercise each week. The exercise should increase your heart rate and make you sweat (moderate-intensity exercise).  Do strength-training exercises at least twice a week. Watch Your Levels of Cholesterol and Blood Lipids  Have your blood tested for lipids and cholesterol every 5 years starting at 61 years of age. If you are at high risk for heart disease, you should start having your blood tested when you are 61 years old. You may need to have your cholesterol levels checked more often if:  Your lipid or cholesterol levels are high.  You are older than 61 years of age.  You are at high risk for heart disease. What should I know about cancer screening? Many types of cancers can be detected early and may often be prevented. Lung Cancer  You should be screened every year for lung cancer if:  You are a current smoker who has smoked for at least 30 years.  You are a former smoker who has quit within the past 15 years.  Talk to your health care provider about your  screening options, when you should start screening, and how often you should be screened. Colorectal Cancer  Routine colorectal cancer screening usually begins at 61 years of age and should be repeated every 5-10 years until you are 61 years old. You may need to be screened more often if early forms of precancerous polyps or small growths are found. Your health care provider may recommend screening at an earlier age if you have risk factors for colon cancer.  Your health care provider may recommend using home test kits to check for hidden blood in the stool.  A small camera at the end of a tube can be used to examine your colon (sigmoidoscopy or colonoscopy). This checks for the earliest forms of colorectal cancer. Prostate and Testicular Cancer  Depending on your age and overall health, your health care provider may do certain tests to screen for prostate and testicular cancer.  Talk to your health care provider about any symptoms or concerns you have about testicular or prostate cancer. Skin Cancer  Check your skin from head to toe regularly.  Tell your health care provider about any new moles or changes in moles, especially if:  There is a change in a mole's size, shape, or color.  You have a mole that is larger than a pencil eraser.  Always use sunscreen. Apply sunscreen liberally and repeat throughout the day.  Protect yourself by wearing long sleeves, pants, a wide-brimmed hat, and sunglasses when outside. What should I  know about heart disease, diabetes, and high blood pressure?  If you are 35-32 years of age, have your blood pressure checked every 3-5 years. If you are 21 years of age or older, have your blood pressure checked every year. You should have your blood pressure measured twice-once when you are at a hospital or clinic, and once when you are not at a hospital or clinic. Record the average of the two measurements. To check your blood pressure when you are not at a  hospital or clinic, you can use:  An automated blood pressure machine at a pharmacy.  A home blood pressure monitor.  Talk to your health care provider about your target blood pressure.  If you are between 16-45 years old, ask your health care provider if you should take aspirin to prevent heart disease.  Have regular diabetes screenings by checking your fasting blood sugar level.  If you are at a normal weight and have a low risk for diabetes, have this test once every three years after the age of 70.  If you are overweight and have a high risk for diabetes, consider being tested at a younger age or more often.  A one-time screening for abdominal aortic aneurysm (AAA) by ultrasound is recommended for men aged 33-75 years who are current or former smokers. What should I know about preventing infection? Hepatitis B  If you have a higher risk for hepatitis B, you should be screened for this virus. Talk with your health care provider to find out if you are at risk for hepatitis B infection. Hepatitis C  Blood testing is recommended for:  Everyone born from [birth year] through 1965.  Anyone with known risk factors for hepatitis C. Sexually Transmitted Diseases (STDs)  You should be screened each year for STDs including gonorrhea and chlamydia if:  You are sexually active and are younger than 61 years of age.  You are older than 61 years of age and your health care provider tells you that you are at risk for this type of infection.  Your sexual activity has changed since you were last screened and you are at an increased risk for chlamydia or gonorrhea. Ask your health care provider if you are at risk.  Talk with your health care provider about whether you are at high risk of being infected with HIV. Your health care provider may recommend a prescription medicine to help prevent HIV infection. What else can I do?  Schedule regular health, dental, and eye exams.  Stay current with your  vaccines (immunizations).  Do not use any tobacco products, such as cigarettes, chewing tobacco, and e-cigarettes. If you need help quitting, ask your health care provider.  Limit alcohol intake to no more than 2 drinks per day. One drink equals 12 ounces of beer, 5 ounces of wine, or 1 ounces of hard liquor.  Do not use street drugs.  Do not share needles.  Ask your health care provider for help if you need support or information about quitting drugs.  Tell your health care provider if you often feel depressed.  Tell your health care provider if you have ever been abused or do not feel safe at home. This information is not intended to replace advice given to you by your health care provider. Make sure you discuss any questions you have with your health care provider. Document Released: 07/25/2007 Document Revised: 09/25/2015 Document Reviewed: 10/30/2014 Elsevier Interactive Patient Education  2017 [employer].

## 2016-06-02 NOTE — Assessment & Plan Note (Signed)
Chronic, stable on omeprazole 20mg daily.  

## 2016-06-02 NOTE — Assessment & Plan Note (Signed)
Preventative protocols reviewed and updated unless pt declined. Discussed healthy diet and lifestyle.  

## 2016-06-02 NOTE — Progress Notes (Signed)
BP (!) 162/98 (BP Location: Right Arm, Cuff Size: Large)   Pulse 80   Temp 98.2 F (36.8 C) (Oral)   Ht '5\' 11"'$  (1.803 m)   Wt 244 lb 4 oz (110.8 kg)   BMI 34.07 kg/m    CC: CPE Subjective:    Patient ID: William Johns, male    DOB: 20-Nov-1955, 61 y.o.   MRN: [id number]  HPI: William Johns is a 61 y.o. male presenting on 06/02/2016 for Annual Exam   Weight gain noted.  Out of work since January.  Several month h/o constant RUQ discomfort described as pressure associated with intermittent mild dyspnea at rest. No fevers/chills, nausea/vomiting, bowel changes diarrhea or constipation. Good appetite. GERD controlled with omeprazole '20mg'$  daily.   h/o cholecystectomy 2007.   Preventative: Colon cancer screening - 11/2008 2 hyperplastic polyps rec rpt 10 yrs [name])  Prostate cancer screening - discussed - declines screening currently. No prostate cancer hx.Nocturia x1-2. Flu yearly  Pnemovax 2014 Tdap 2013 Shingrix - interested.  Seat belt use discussed Sunscreen use discussed. No changing moles on skin.  Ex smoker - quit 2009.  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 Interim medical history since our last visit reviewed. Allergies and medications reviewed and updated. Outpatient Medications Prior to Visit  Medication Sig Dispense Refill  . aspirin 81 MG tablet Take 81 mg by mouth daily.    . Blood Glucose Monitoring Suppl (BAYER CONTOUR MONITOR) w/Device KIT by Does not apply route. Use to check sugar daily and as needed Dx: E11.65    . clobetasol cream (TEMOVATE) 0.26 % Apply 1 application topically 2 (two) times daily. Apply to AA 45 g 0  . glucose blood  test strip 1 each by Other route as needed for other. Use as instructed to check sugar once daily and as needed. E11.65 **Contour**    . Lancets MISC by Does not apply route. Use as directed to check sugar once daily and as needed. Dx. E11.65 **Contour**    . Multiple Vitamin (MULTIVITAMIN) tablet Take 1 tablet by mouth daily.    . OMEGA-3 FATTY ACIDS PO Take 1 capsule by mouth daily.    Marland Kitchen omeprazole (PRILOSEC OTC) 20 MG tablet Take 20 mg by mouth daily.    . tamsulosin (FLOMAX) 0.4 MG CAPS capsule Take 1 capsule (0.4 mg total) by mouth daily. 14 capsule 0  . atorvastatin (LIPITOR) 40 MG tablet TAKE 1 TABLET BY MOUTH ONCE A DAY 90 tablet 1  . fenofibrate 160 MG tablet TAKE 1 TABLET BY MOUTH EVERY DAY 30 tablet 3  . HYDROcodone-acetaminophen (NORCO/VICODIN) 5-325 MG tablet Take 1 tablet by mouth every 6 (six) hours as needed for moderate pain. Do not drive while taking this medication. 15 tablet 0  . lisinopril (PRINIVIL,ZESTRIL) 5 MG tablet TAKE 1 TABLET BY MOUTH EVERY DAY 30 tablet 3  . sildenafil (VIAGRA) 100 MG tablet TAKE 1/2 - 1 TALET BY MOUTH AS NEEDED FOR ERECTILE DYSFUNCTION 5 tablet 6  . HYDROcodone-acetaminophen (NORCO) 5-325 MG per tablet Take 1 tablet by mouth every 6 (six) hours as needed for moderate pain. (Patient not taking: Reported on 09/11/2015) 30 tablet 0  . metFORMIN (  GLUCOPHAGE) 500 MG tablet Take one tablet with breakfast daily for 1 week then increase to twice daily with meal. (Patient not taking: Reported on 06/02/2016) 180 tablet 3  . ondansetron (ZOFRAN ODT) 4 MG disintegrating tablet Take 1 tablet (4 mg total) by mouth every 8 (eight) hours as needed for nausea or vomiting. 12 tablet 0   No facility-administered medications prior to visit.      Per HPI unless specifically indicated in ROS section below Review of Systems  Constitutional: Negative for activity change, appetite change, chills, fatigue, fever and unexpected weight change.  HENT: Negative for hearing loss.     Eyes: Negative for visual disturbance.  Respiratory: Positive for shortness of breath. Negative for cough, chest tightness and wheezing.   Cardiovascular: Negative for chest pain, palpitations and leg swelling.  Gastrointestinal: Negative for abdominal distention, abdominal pain (see HPI), blood in stool, constipation, diarrhea, nausea and vomiting.  Genitourinary: Positive for hematuria (kidney stone related). Negative for difficulty urinating.  Musculoskeletal: Negative for arthralgias, myalgias and neck pain.  Skin: Negative for rash.  Neurological: Negative for dizziness, seizures, syncope and headaches.  Hematological: Negative for adenopathy. Does not bruise/bleed easily.  Psychiatric/Behavioral: Negative for dysphoric mood. The patient is not nervous/anxious.        Objective:    BP (!) 162/98 (BP Location: Right Arm, Cuff Size: Large)   Pulse 80   Temp 98.2 F (36.8 C) (Oral)   Ht _0  (1.803 m)   Wt 244 lb 4 oz (110.8 kg)   BMI 34.07 kg/m   Wt Readings from Last 3 Encounters:  06/02/16 244 lb 4 oz (110.8 kg)  09/11/15 232 lb 8 oz (105.5 kg)  07/04/15 245 lb (111.1 kg)    Physical Exam  Constitutional: He is oriented to person, place, and time. He appears well-developed and well-nourished. No distress.  HENT:  Head: Normocephalic and atraumatic.  Right Ear: Hearing, tympanic membrane, external ear and ear canal normal.  Left Ear: Hearing, tympanic membrane, external ear and ear canal normal.  Nose: Nose normal.  Mouth/Throat: Uvula is midline, oropharynx is clear and moist and mucous membranes are normal. No oropharyngeal exudate, posterior oropharyngeal edema or posterior oropharyngeal erythema.  Eyes: Conjunctivae and EOM are normal. Pupils are equal, round, and reactive to light. No scleral icterus.  Neck: Normal range of motion. Neck supple. No thyromegaly present.  Cardiovascular: Normal rate, regular rhythm, normal heart sounds and intact distal pulses.   No  murmur heard. Pulses:      Radial pulses are 2+ on the right side, and 2+ on the left side.  Pulmonary/Chest: Effort normal and breath sounds normal. No respiratory distress. He has no wheezes. He has no rales.  Abdominal: Soft. Bowel sounds are normal. He exhibits no distension and no mass. There is no tenderness. There is no rebound and no guarding.  RUQ scar present  Genitourinary:  Genitourinary Comments: DRE declined  Musculoskeletal: Normal range of motion. He exhibits no edema.  Lymphadenopathy:    He has no cervical adenopathy.  Neurological: He is alert and oriented to person, place, and time.  CN grossly intact, station and gait intact  Skin: Skin is warm and dry. No rash noted.  Psychiatric: He has a normal mood and affect. His behavior is normal. Judgment and thought content normal.  Nursing note and vitals reviewed.  Results for orders placed or performed in visit on 05/26/16  Lipid panel  Result Value Ref Range   Cholesterol 153 0 -  200 mg/dL   Triglycerides 180.0 (H) 0.0 - 149.0 mg/dL   HDL 25.90 (L) >39.00 mg/dL   VLDL 36.0 0.0 - 40.0 mg/dL   LDL Cholesterol 91 0 - 99 mg/dL   Total CHOL/HDL Ratio 6    NonHDL 127.38   Comprehensive metabolic panel  Result Value Ref Range   Sodium 140 135 - 145 mEq/L   Potassium 3.8 3.5 - 5.1 mEq/L   Chloride 105 96 - 112 mEq/L   CO2 29 19 - 32 mEq/L   Glucose, Bld 166 (H) 70 - 99 mg/dL   BUN 23 6 - 23 mg/dL   Creatinine, Ser 1.22 0.40 - 1.50 mg/dL   Total Bilirubin 0.7 0.2 - 1.2 mg/dL   Alkaline Phosphatase 48 39 - 117 U/L   AST 20 0 - 37 U/L   ALT 23 0 - 53 U/L   Total Protein 6.4 6.0 - 8.3 g/dL   Albumin 4.2 3.5 - 5.2 g/dL   Calcium 9.3 8.4 - 10.5 mg/dL   GFR 64.24 >60.00 mL/min  Hemoglobin A1c  Result Value Ref Range   Hgb A1c MFr Bld 6.7 (H) 4.6 - 6.5 %  PSA  Result Value Ref Range   PSA 1.27 0.10 - 4.00 ng/mL   EKG - NSR rate 75, normal axis, intervals, no acute ST/T changes.     Assessment & Plan:   Problem  List Items Addressed This Visit    Controlled type 2 diabetes mellitus without complication, without long-term current use of insulin ([place])    Stable period. Continue metformin 577m once daily. Encouraged he schedule eye exam. RTC 473moM f/u visit.       Relevant Medications   metFORMIN (GLUCOPHAGE) 500 MG tablet   lisinopril (PRINIVIL,ZESTRIL) 10 MG tablet   atorvastatin (LIPITOR) 40 MG tablet   Other Relevant Orders   US[place]abdomen Complete   Dyslipidemia    Chronic, stable on current 3 drug regimen (lipitor, fibrate, fish oil).       Relevant Medications   fenofibrate 160 MG tablet   atorvastatin (LIPITOR) 40 MG tablet   Dyspnea    Mild dyspnea endorsed without chest pain. Given risk factors, check baseline EKG today.      Relevant Orders   EKG 12-Lead (Completed)   ED (erectile dysfunction)    Discussed generic sildenafil and how this is off formulary use of medication. Pt requests Rx to pharmacy.       Encounter for general adult medical examination with abnormal findings - Primary    Preventative protocols reviewed and updated unless pt declined. Discussed healthy diet and lifestyle.       GERD (gastroesophageal reflux disease)    Chronic, stable on omeprazole 2066maily.       HTN (hypertension)    Chronic, deteriorated. Pt states he has not taken antihypertensive in last 2 days. Will increase lisinopril to 29m24mily.      Relevant Medications   lisinopril (PRINIVIL,ZESTRIL) 10 MG tablet   fenofibrate 160 MG tablet   atorvastatin (LIPITOR) 40 MG tablet   sildenafil (REVATIO) 20 MG tablet   Obesity, Class I, BMI 30-34.9    Discussed healthy diet and lifestyle changes to affect sustainable weight loss.      Relevant Medications   metFORMIN (GLUCOPHAGE) 500 MG tablet   RUQ discomfort    Does not describe true pain. No masses on exam.  s/p cholecystectomy.  Large scar RUQ ?scar tissue related. ? CMP WNL. Check abd US tKoreaeval  fatty liver vs other cause.  No  BM changes so doubt intestine related.       Relevant Orders   US Abdomen Complete       Follow up plan: Return in about 4 months (around 10/02/2016) for follow up visit.  Ria Bush, MD

## 2016-06-02 NOTE — Assessment & Plan Note (Signed)
Discussed generic sildenafil and how this is off formulary use of medication. Pt requests Rx to pharmacy.

## 2016-06-02 NOTE — Assessment & Plan Note (Signed)
Chronic, deteriorated. Pt states he has not taken antihypertensive in last 2 days. Will increase lisinopril to 10mg  daily.

## 2016-06-02 NOTE — Progress Notes (Signed)
Pre visit review using our clinic review tool, if applicable. No additional management support is needed unless otherwise documented below in the visit note. 

## 2016-06-02 NOTE — Assessment & Plan Note (Signed)
Mild dyspnea endorsed without chest pain. Given risk factors, check baseline EKG today.

## 2016-06-02 NOTE — Assessment & Plan Note (Signed)
Stable period. Continue metformin 500mg  once daily. Encouraged he schedule eye exam. RTC 44mo DM f/u visit.

## 2016-06-12 ENCOUNTER — Ambulatory Visit
Admission: RE | Admit: 2016-06-12 | Discharge: 2016-06-12 | Disposition: A | Discharge status: Home / Self Care | Payer: BLUE CROSS/BLUE SHIELD | Source: Ambulatory Visit | Attending: Family Medicine | Admitting: Family Medicine

## 2016-06-12 DIAGNOSIS — E119 Type 2 diabetes mellitus without complications: Secondary | ICD-10-CM

## 2016-06-12 DIAGNOSIS — R109 Unspecified abdominal pain: Secondary | ICD-10-CM | POA: Diagnosis not present

## 2016-06-12 DIAGNOSIS — R1011 Right upper quadrant pain: Secondary | ICD-10-CM

## 2016-06-13 ENCOUNTER — Encounter: Payer: Self-pay | Admitting: Family Medicine

## 2016-06-13 DIAGNOSIS — Z87442 Personal history of urinary calculi: Secondary | ICD-10-CM | POA: Insufficient documentation

## 2016-06-13 DIAGNOSIS — N200 Calculus of kidney: Secondary | ICD-10-CM

## 2016-06-13 DIAGNOSIS — I700 Atherosclerosis of aorta: Secondary | ICD-10-CM | POA: Insufficient documentation

## 2016-06-13 DIAGNOSIS — I77819 Aortic ectasia, unspecified site: Secondary | ICD-10-CM | POA: Insufficient documentation

## 2016-07-10 LAB — HM DIABETES EYE EXAM

## 2016-07-31 ENCOUNTER — Other Ambulatory Visit: Payer: Self-pay | Admitting: Family Medicine

## 2016-09-14 ENCOUNTER — Encounter: Payer: Self-pay | Admitting: Family Medicine

## 2016-09-14 MED ORDER — TAMSULOSIN HCL 0.4 MG PO CAPS: 0.4000 mg | ORAL_CAPSULE | Freq: Every day | ORAL | 0 refills | Status: DC

## 2016-10-05 ENCOUNTER — Encounter: Payer: Self-pay | Admitting: Family Medicine

## 2016-10-05 ENCOUNTER — Ambulatory Visit (INDEPENDENT_AMBULATORY_CARE_PROVIDER_SITE_OTHER): Payer: BLUE CROSS/BLUE SHIELD | Admitting: Family Medicine

## 2016-10-05 VITALS — BP 124/72 | HR 74 | Temp 97.6°F | Wt 242.0 lb

## 2016-10-05 DIAGNOSIS — N200 Calculus of kidney: Secondary | ICD-10-CM | POA: Diagnosis not present

## 2016-10-05 DIAGNOSIS — I700 Atherosclerosis of aorta: Secondary | ICD-10-CM

## 2016-10-05 DIAGNOSIS — E119 Type 2 diabetes mellitus without complications: Secondary | ICD-10-CM | POA: Diagnosis not present

## 2016-10-05 DIAGNOSIS — I10 Essential (primary) hypertension: Secondary | ICD-10-CM

## 2016-10-05 DIAGNOSIS — E785 Hyperlipidemia, unspecified: Secondary | ICD-10-CM

## 2016-10-05 DIAGNOSIS — G8929 Other chronic pain: Secondary | ICD-10-CM | POA: Diagnosis not present

## 2016-10-05 LAB — BASIC METABOLIC PANEL
BUN: 17 mg/dL (ref 6–23)
CALCIUM: 9.7 mg/dL (ref 8.4–10.5)
CO2: 27 mEq/L (ref 19–32)
CREATININE: 1.09 mg/dL (ref 0.40–1.50)
Chloride: 107 mEq/L (ref 96–112)
GFR: 73.08 mL/min (ref >60.00)
Glucose, Bld: 153 mg/dL — ABNORMAL HIGH (ref 70–99)
Potassium: 3.9 mEq/L (ref 3.5–5.1)
Sodium: 139 mEq/L (ref 135–145)

## 2016-10-05 LAB — HEMOGLOBIN A1C: HEMOGLOBIN A1C: 6.7 % — AB (ref 4.6–6.5)

## 2016-10-05 MED ORDER — HYDROCODONE-ACETAMINOPHEN 5-325 MG PO TABS: 1.0000 | ORAL_TABLET | Freq: Four times a day (QID) | ORAL | 0 refills | Status: DC | PRN

## 2016-10-05 NOTE — Assessment & Plan Note (Signed)
Chronic, stable. Continue current regimen. 

## 2016-10-05 NOTE — Addendum Note (Signed)
Addended by: Mady Haagensen on: 10/05/2016 10:00 AM   Modules accepted: Orders

## 2016-10-05 NOTE — Assessment & Plan Note (Signed)
H/o recurrent kidney stones, passed one earlier this month with flomax assistance. He does take hydrocodone and ibuprofen PRN for these. Discussed importance of water intake to keep urine clear. He does have large non-obstructing L kidney stone. Declines symptoms at this time. Reviewed red flags to seek urgent care - fever, decreased UOP, etc.

## 2016-10-05 NOTE — Patient Instructions (Addendum)
Labs today. Return as needed or in 8 months for next physical.  Watch for any left flank pain - let us know if any fevers or trouble passing urine. You do have evidence of a large left sided kidney stone by latest ultrasound.  We will do urine screen next time you need hydrocodone refilled.

## 2016-10-05 NOTE — Assessment & Plan Note (Signed)
Encouraged continued aspirin and statin.

## 2016-10-05 NOTE — Progress Notes (Signed)
BP 124/72 (BP Location: Left Arm, Patient Position: Sitting, Cuff Size: Large)   Pulse 74   Temp 97.6 F (36.4 C) (Oral)   Wt 242 lb (109.8 kg)   SpO2 97%   BMI 33.75 kg/m    CC: 4 mo f/u visit Subjective:    Patient ID: Refael Fulop, male    DOB: 1955/08/07, 61 y.o.   MRN: [id number]  HPI: Cyncere Sontag is a 61 y.o. male presenting on 10/05/2016 for 4 month follow-up (Does not check blood sugar regularly) and Opioid Medication Management (Due for UDS)   Not working since 2022/03/04.   Kidney stones - passed one kidney stone last week - flomax helped. Treats with hydrocodone. Doesn't need refill. Tends to get at least one stone every 2 yrs. Has seen urologist in [place] remotely s/p lithotripsy.   HLD - tolerating fenofibrate, atorvastatin and fish oil without myalgias.   HTN - No HA, vision changes, CP/tightness, SOB, leg swelling. Compliant with lisinopril 90m daily. Checks at home.   DM - regularly does check sugars averaging 130s. Compliant with antihyperglycemic regimen which includes: metformin 5068mdaily. Denies low sugars or hypoglycemic symptoms.  Denies paresthesias. Last diabetic eye exam 07/2016.  Pneumovax: 2014.  Prevnar: not yet due.  Lab Results  Component Value Date   HGBA1C 6.7 (H) 05/26/2016   Diabetic Foot Exam - Simple   Simple Foot Form Diabetic Foot exam was performed with the following findings:  Yes 10/05/2016  9:46 AM  Visual Inspection No deformities, no ulcerations, no other skin breakdown bilaterally:  Yes Sensation Testing Intact to touch and monofilament testing bilaterally:  Yes Pulse Check Posterior Tibialis and Dorsalis pulse intact bilaterally:  Yes Comments Slightly diminished DP bilaterally, 2+ PT      Relevant past medical, surgical, family and social history reviewed and updated as indicated. Interim medical history since our last visit reviewed. Allergies and medications reviewed and updated. Outpatient Medications Prior to Visit    Medication Sig Dispense Refill  . aspirin 81 MG tablet Take 81 mg by mouth daily.    . Marland Kitchentorvastatin (LIPITOR) 40 MG tablet Take 1 tablet (40 mg total) by mouth daily. 90 tablet 3  . Blood Glucose Monitoring Suppl (BAYER CONTOUR MONITOR) w/Device KIT by Does not apply route. Use to check sugar daily and as needed Dx: E11.65    . clobetasol cream (TEMOVATE) 0.0.93 Apply 1 application topically 2 (two) times daily. Apply to AA 45 g 0  . fenofibrate 160 MG tablet Take 1 tablet (160 mg total) by mouth daily. 90 tablet 3  . glucose blood test strip 1 each by Other route as needed for other. Use as instructed to check sugar once daily and as needed. E11.65 **Contour**    . Lancets MISC by Does not apply route. Use as directed to check sugar once daily and as needed. Dx. E11.65 **Contour**    . lisinopril (PRINIVIL,ZESTRIL) 10 MG tablet Take 1 tablet (10 mg total) by mouth daily. 90 tablet 3  . metFORMIN (GLUCOPHAGE) 500 MG tablet Take 1 tablet (500 mg total) by mouth daily. 90 tablet 1  . Multiple Vitamin (MULTIVITAMIN) tablet Take 1 tablet by mouth daily.    . OMEGA-3 FATTY ACIDS PO Take 1 capsule by mouth daily.    . Marland Kitchenmeprazole (PRILOSEC OTC) 20 MG tablet Take 20 mg by mouth daily.    . sildenafil (REVATIO) 20 MG tablet Take 2-4 tablets (40-80 mg total) by mouth daily as needed (relations). 30 tablet  3  . tamsulosin (FLOMAX) 0.4 MG CAPS capsule Take 1 capsule (0.4 mg total) by mouth daily. 30 capsule 0  . HYDROcodone-acetaminophen (NORCO/VICODIN) 5-325 MG tablet Take 1 tablet by mouth every 6 (six) hours as needed for moderate pain (kidney stone). Do not drive while taking this medication. 20 tablet 0  . metFORMIN (GLUCOPHAGE) 500 MG tablet Take 500 mg by mouth daily with breakfast.     No facility-administered medications prior to visit.      Per HPI unless specifically indicated in ROS section below Review of Systems     Objective:    BP 124/72 (BP Location: Left Arm, Patient Position:  Sitting, Cuff Size: Large)   Pulse 74   Temp 97.6 F (36.4 C) (Oral)   Wt 242 lb (109.8 kg)   SpO2 97%   BMI 33.75 kg/m   Wt Readings from Last 3 Encounters:  10/05/16 242 lb (109.8 kg)  06/02/16 244 lb 4 oz (110.8 kg)  09/11/15 232 lb 8 oz (105.5 kg)    Physical Exam  Constitutional: He appears well-developed and well-nourished. No distress.  HENT:  Head: Normocephalic and atraumatic.  Right Ear: External ear normal.  Left Ear: External ear normal.  Nose: Nose normal.  Mouth/Throat: Oropharynx is clear and moist. No oropharyngeal exudate.  Eyes: Pupils are equal, round, and reactive to light. Conjunctivae and EOM are normal. No scleral icterus.  Neck: Normal range of motion. Neck supple.  Cardiovascular: Normal rate, regular rhythm, normal heart sounds and intact distal pulses.   No murmur heard. Pulmonary/Chest: Effort normal and breath sounds normal. No respiratory distress. He has no wheezes. He has no rales.  Musculoskeletal: He exhibits no edema.  See HPI for foot exam if done  Lymphadenopathy:    He has no cervical adenopathy.  Skin: Skin is warm and dry. No rash noted.  Psychiatric: He has a normal mood and affect.  Nursing note and vitals reviewed.  Results for orders placed or performed in visit on 10/05/16  HM DIABETES EYE EXAM  Result Value Ref Range   HM Diabetic Eye Exam No Retinopathy No Retinopathy      Assessment & Plan:   Problem List Items Addressed This Visit    Abdominal aortic atherosclerosis ([place])    Encouraged continued aspirin and statin.       Controlled type 2 diabetes mellitus without complication, without long-term current use of insulin ([facility]) - Primary    Chronic, stable. Continue current regimen. Continue metformin. He states he has seen eye doctor, I didn't receive records. Chart updated.       Relevant Orders   Basic metabolic panel   Hemoglobin A1c   Dyslipidemia    Chronic, stable. Continue current regimen.       Encounter  for chronic pain management    [place] CSRS reviewed and appropriate.  He does not need narcotic refilled at this time.  Agrees to UDS next visit.       HTN (hypertension)    Chronic, stable. Continue current regimen.       Nephrolithiasis    H/o recurrent kidney stones, passed one earlier this month with flomax assistance. He does take hydrocodone and ibuprofen PRN for these. Discussed importance of water intake to keep urine clear. He does have large non-obstructing L kidney stone. Declines symptoms at this time. Reviewed red flags to seek urgent care - fever, decreased UOP, etc.       Relevant Medications   HYDROcodone-acetaminophen (NORCO/VICODIN) 5-325 MG tablet  Follow up plan: Return in about 8 months (around 06/05/2017) for annual exam, prior fasting for blood work.  Ria Bush, MD

## 2016-10-05 NOTE — Assessment & Plan Note (Signed)
Chronic, stable. Continue current regimen. Continue metformin. He states he has seen eye doctor, I didn't receive records. Chart updated.

## 2016-10-05 NOTE — Assessment & Plan Note (Addendum)
[place] CSRS reviewed and appropriate.  He does not need narcotic refilled at this time.  Agrees to UDS next visit.

## 2016-11-25 ENCOUNTER — Other Ambulatory Visit: Payer: Self-pay | Admitting: Family Medicine

## 2017-02-08 ENCOUNTER — Other Ambulatory Visit: Payer: Self-pay | Admitting: Family Medicine

## 2017-02-26 DIAGNOSIS — R1032 Left lower quadrant pain: Secondary | ICD-10-CM | POA: Diagnosis not present

## 2017-02-26 DIAGNOSIS — E860 Dehydration: Secondary | ICD-10-CM | POA: Diagnosis not present

## 2017-02-26 DIAGNOSIS — N201 Calculus of ureter: Secondary | ICD-10-CM | POA: Diagnosis not present

## 2017-02-26 DIAGNOSIS — N132 Hydronephrosis with renal and ureteral calculous obstruction: Secondary | ICD-10-CM | POA: Diagnosis not present

## 2017-02-26 DIAGNOSIS — K5730 Diverticulosis of large intestine without perforation or abscess without bleeding: Secondary | ICD-10-CM | POA: Diagnosis not present

## 2017-02-26 DIAGNOSIS — K760 Fatty (change of) liver, not elsewhere classified: Secondary | ICD-10-CM | POA: Diagnosis not present

## 2017-04-26 ENCOUNTER — Other Ambulatory Visit: Payer: Self-pay | Admitting: Family Medicine

## 2017-05-27 ENCOUNTER — Other Ambulatory Visit: Payer: BLUE CROSS/BLUE SHIELD

## 2017-05-27 ENCOUNTER — Other Ambulatory Visit: Payer: Self-pay | Admitting: Family Medicine

## 2017-05-27 ENCOUNTER — Other Ambulatory Visit (INDEPENDENT_AMBULATORY_CARE_PROVIDER_SITE_OTHER): Payer: BLUE CROSS/BLUE SHIELD

## 2017-05-27 DIAGNOSIS — E785 Hyperlipidemia, unspecified: Secondary | ICD-10-CM

## 2017-05-27 DIAGNOSIS — Z125 Encounter for screening for malignant neoplasm of prostate: Secondary | ICD-10-CM

## 2017-05-27 DIAGNOSIS — E119 Type 2 diabetes mellitus without complications: Secondary | ICD-10-CM | POA: Diagnosis not present

## 2017-05-27 LAB — LIPID PANEL
CHOL/HDL RATIO: 5
Cholesterol: 155 mg/dL (ref 0–200)
HDL: 30.2 mg/dL — ABNORMAL LOW (ref >39.00)
LDL CALC: 98 mg/dL (ref 0–99)
NONHDL: 125.14
Triglycerides: 137 mg/dL (ref 0.0–149.0)
VLDL: 27.4 mg/dL (ref 0.0–40.0)

## 2017-05-27 LAB — COMPREHENSIVE METABOLIC PANEL
ALT: 18 U/L (ref 0–53)
AST: 19 U/L (ref 0–37)
Albumin: 4.3 g/dL (ref 3.5–5.2)
Alkaline Phosphatase: 47 U/L (ref 39–117)
BUN: 18 mg/dL (ref 6–23)
CHLORIDE: 105 meq/L (ref 96–112)
CO2: 29 meq/L (ref 19–32)
CREATININE: 1.31 mg/dL (ref 0.40–1.50)
Calcium: 9.6 mg/dL (ref 8.4–10.5)
GFR: 58.98 mL/min — ABNORMAL LOW (ref >60.00)
GLUCOSE: 128 mg/dL — AB (ref 70–99)
Potassium: 3.9 mEq/L (ref 3.5–5.1)
Sodium: 139 mEq/L (ref 135–145)
Total Bilirubin: 0.8 mg/dL (ref 0.2–1.2)
Total Protein: 6.5 g/dL (ref 6.0–8.3)

## 2017-05-27 LAB — HEMOGLOBIN A1C: Hgb A1c MFr Bld: 6.6 % — ABNORMAL HIGH (ref 4.6–6.5)

## 2017-05-27 LAB — PSA: PSA: 1.11 ng/mL (ref 0.10–4.00)

## 2017-06-03 ENCOUNTER — Encounter: Payer: Self-pay | Admitting: Family Medicine

## 2017-06-03 ENCOUNTER — Ambulatory Visit (INDEPENDENT_AMBULATORY_CARE_PROVIDER_SITE_OTHER): Payer: BLUE CROSS/BLUE SHIELD | Admitting: Family Medicine

## 2017-06-03 VITALS — BP 122/76 | HR 79 | Temp 97.9°F | Ht 70.0 in | Wt 238.2 lb

## 2017-06-03 DIAGNOSIS — I77819 Aortic ectasia, unspecified site: Secondary | ICD-10-CM | POA: Diagnosis not present

## 2017-06-03 DIAGNOSIS — E785 Hyperlipidemia, unspecified: Secondary | ICD-10-CM | POA: Diagnosis not present

## 2017-06-03 DIAGNOSIS — Z87442 Personal history of urinary calculi: Secondary | ICD-10-CM

## 2017-06-03 DIAGNOSIS — E119 Type 2 diabetes mellitus without complications: Secondary | ICD-10-CM

## 2017-06-03 DIAGNOSIS — G8929 Other chronic pain: Secondary | ICD-10-CM

## 2017-06-03 DIAGNOSIS — I700 Atherosclerosis of aorta: Secondary | ICD-10-CM

## 2017-06-03 DIAGNOSIS — Z0001 Encounter for general adult medical examination with abnormal findings: Secondary | ICD-10-CM | POA: Diagnosis not present

## 2017-06-03 DIAGNOSIS — E669 Obesity, unspecified: Secondary | ICD-10-CM

## 2017-06-03 DIAGNOSIS — I10 Essential (primary) hypertension: Secondary | ICD-10-CM | POA: Diagnosis not present

## 2017-06-03 DIAGNOSIS — E66811 Obesity, class 1: Secondary | ICD-10-CM

## 2017-06-03 NOTE — Assessment & Plan Note (Addendum)
Chronic, stable. Continue lipitor and fenofibrate and fish oil.  The 10-year ASCVD risk score William Johns DC Brooke Bonito., et al., 2013) is: 20.8%   Values used to calculate the score:     Age: 62 years     Sex: Male     Is Non-Hispanic African American: No     Diabetic: Yes     Tobacco smoker: No     Systolic Blood Pressure: 103 mmHg     Is BP treated: Yes     HDL Cholesterol: 30.2 mg/dL     Total Cholesterol: 155 mg/dL

## 2017-06-03 NOTE — Assessment & Plan Note (Signed)
Chronic, stable. Continue current regimen. 

## 2017-06-03 NOTE — Assessment & Plan Note (Signed)
Rare hydrocodone for kidney stones.

## 2017-06-03 NOTE — Assessment & Plan Note (Signed)
Preventative protocols reviewed and updated unless pt declined. Discussed healthy diet and lifestyle.  

## 2017-06-03 NOTE — Patient Instructions (Addendum)
If interested, check with pharmacy about new 2 shot shingles series (shingrix).  Increase water You are doing well today Return as needed or in 1 year for next physical.   Health Maintenance, Male A healthy lifestyle and preventive care is important for your health and wellness. Ask your health care provider about what schedule of regular examinations is right for you. What should I know about weight and diet? Eat a Healthy Diet  Eat plenty of vegetables, fruits, whole grains, low-fat dairy products, and lean protein.  Do not eat a lot of foods high in solid fats, added sugars, or salt.  Maintain a Healthy Weight Regular exercise can help you achieve or maintain a healthy weight. You should:  Do at least 150 minutes of exercise each week. The exercise should increase your heart rate and make you sweat (moderate-intensity exercise).  Do strength-training exercises at least twice a week.  Watch Your Levels of Cholesterol and Blood Lipids  Have your blood tested for lipids and cholesterol every 5 years starting at 62 years of age. If you are at high risk for heart disease, you should start having your blood tested when you are 62 years old. You may need to have your cholesterol levels checked more often if: ? Your lipid or cholesterol levels are high. ? You are older than 62 years of age. ? You are at high risk for heart disease.  What should I know about cancer screening? Many types of cancers can be detected early and may often be prevented. Lung Cancer  You should be screened every year for lung cancer if: ? You are a current smoker who has smoked for at least 30 years. ? You are a former smoker who has quit within the past 15 years.  Talk to your health care provider about your screening options, when you should start screening, and how often you should be screened.  Colorectal Cancer  Routine colorectal cancer screening usually begins at 62 years of age and should be  repeated every 5-10 years until you are 62 years old. You may need to be screened more often if early forms of precancerous polyps or small growths are found. Your health care provider may recommend screening at an earlier age if you have risk factors for colon cancer.  Your health care provider may recommend using home test kits to check for hidden blood in the stool.  A small camera at the end of a tube can be used to examine your colon (sigmoidoscopy or colonoscopy). This checks for the earliest forms of colorectal cancer.  Prostate and Testicular Cancer  Depending on your age and overall health, your health care provider may do certain tests to screen for prostate and testicular cancer.  Talk to your health care provider about any symptoms or concerns you have about testicular or prostate cancer.  Skin Cancer  Check your skin from head to toe regularly.  Tell your health care provider about any new moles or changes in moles, especially if: ? There is a change in a mole's size, shape, or color. ? You have a mole that is larger than a pencil eraser.  Always use sunscreen. Apply sunscreen liberally and repeat throughout the day.  Protect yourself by wearing long sleeves, pants, a wide-brimmed hat, and sunglasses when outside.  What should I know about heart disease, diabetes, and high blood pressure?  If you are 48-21 years of age, have your blood pressure checked every 3-5 years. If you are  5 years of age or older, have your blood pressure checked every year. You should have your blood pressure measured twice-once when you are at a hospital or clinic, and once when you are not at a hospital or clinic. Record the average of the two measurements. To check your blood pressure when you are not at a hospital or clinic, you can use: ? An automated blood pressure machine at a pharmacy. ? A home blood pressure monitor.  Talk to your health care provider about your target blood  pressure.  If you are between 81-70 years old, ask your health care provider if you should take aspirin to prevent heart disease.  Have regular diabetes screenings by checking your fasting blood sugar level. ? If you are at a normal weight and have a low risk for diabetes, have this test once every three years after the age of 50. ? If you are overweight and have a high risk for diabetes, consider being tested at a younger age or more often.  A one-time screening for abdominal aortic aneurysm (AAA) by ultrasound is recommended for men aged 73-75 years who are current or former smokers. What should I know about preventing infection? Hepatitis B If you have a higher risk for hepatitis B, you should be screened for this virus. Talk with your health care provider to find out if you are at risk for hepatitis B infection. Hepatitis C Blood testing is recommended for:  Everyone born from [birth year] through 1965.  Anyone with known risk factors for hepatitis C.  Sexually Transmitted Diseases (STDs)  You should be screened each year for STDs including gonorrhea and chlamydia if: ? You are sexually active and are younger than 61 years of age. ? You are older than 62 years of age and your health care provider tells you that you are at risk for this type of infection. ? Your sexual activity has changed since you were last screened and you are at an increased risk for chlamydia or gonorrhea. Ask your health care provider if you are at risk.  Talk with your health care provider about whether you are at high risk of being infected with HIV. Your health care provider may recommend a prescription medicine to help prevent HIV infection.  What else can I do?  Schedule regular health, dental, and eye exams.  Stay current with your vaccines (immunizations).  Do not use any tobacco products, such as cigarettes, chewing tobacco, and e-cigarettes. If you need help quitting, ask your health care  provider.  Limit alcohol intake to no more than 2 drinks per day. One drink equals 12 ounces of beer, 5 ounces of wine, or 1 ounces of hard liquor.  Do not use street drugs.  Do not share needles.  Ask your health care provider for help if you need support or information about quitting drugs.  Tell your health care provider if you often feel depressed.  Tell your health care provider if you have ever been abused or do not feel safe at home. This information is not intended to replace advice given to you by your health care provider. Make sure you discuss any questions you have with your health care provider. Document Released: 07/25/2007 Document Revised: 09/25/2015 Document Reviewed: 10/30/2014 Elsevier Interactive Patient Education  [employer].

## 2017-06-03 NOTE — Assessment & Plan Note (Signed)
Reviewed healthy diet and lifestyle changes to affect sustainable weight loss. Encouraged continued walking routine.

## 2017-06-03 NOTE — Assessment & Plan Note (Addendum)
Chronic, stable. Continue current regimen. Foot exam today. Eye exam 07/2016.

## 2017-06-03 NOTE — Assessment & Plan Note (Signed)
Continue aspirin, statin.  

## 2017-06-03 NOTE — Progress Notes (Signed)
BP 122/76 (BP Location: Left Arm, Patient Position: Sitting, Cuff Size: Large)   Pulse 79   Temp 97.9 F (36.6 C) (Oral)   Ht _0  (1.778 m)   Wt 238 lb 4 oz (108.1 kg)   SpO2 95%   BMI 34.19 kg/m    CC: CPE Subjective:    Patient ID: William Johns, male    DOB: Jun 19, 1955, 62 y.o.   MRN: [id number]  HPI: William Johns is a 62 y.o. male presenting on 06/03/2017 for Annual Exam   May have passed kidney stone last month. Last hydrocodone refilled 02/2017. Uses this and flomax rarely for kidney stones.  Preventative: Colon cancer screening - 11/2008 2 hyperplastic polyps rec rpt 10 yrs [name]).  Lung cancer screening - ~30 PY hx. Quit 2009. fmhx lung cancer. Interested - requests postponed to next year.  Prostate cancer screening - discussed - declines screening currently. No prostate cancer hx.Nocturia x1-2. Flu yearly  Pnemovax 2014 Tdap 2013 Shingrix - discussed.  Seat belt use discussed Sunscreen use discussed. No changing moles on skin.  Eye doctor yearly Dentist - due. Brushing teeth regularly, flossing, listerine. 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 Interim medical history since our last visit reviewed. Allergies and medications reviewed and updated. Outpatient Medications Prior to Visit  Medication Sig Dispense Refill  . aspirin 81 MG tablet Take 81 mg by mouth daily.    Marland Kitchen atorvastatin (LIPITOR) 40 MG tablet TAKE 1 TABLET (40 MG TOTAL) BY MOUTH DAILY. 90 tablet 0  . Blood Glucose Monitoring Suppl (BAYER CONTOUR MONITOR) w/Device KIT by Does not apply route. Use to check sugar  daily and as needed Dx: E11.65    . clobetasol cream (TEMOVATE) 0.30 % Apply 1 application topically 2 (two) times daily. Apply to AA 45 g 0  . fenofibrate 160 MG tablet Take 1 tablet (160 mg total) by mouth daily. 90 tablet 3  . glucose blood test strip 1 each by Other route as needed for other. Use as instructed to check sugar once daily and as needed. E11.65 **Contour**    . HYDROcodone-acetaminophen (NORCO/VICODIN) 5-325 MG tablet Take 1 tablet by mouth every 6 (six) hours as needed for moderate pain (kidney stone). Do not drive while taking this medication. VOIDED RX. Not provided to patient today. He doesn't need yet.  0  . Lancets MISC by Does not apply route. Use as directed to check sugar once daily and as needed. Dx. E11.65 **Contour**    . lisinopril (PRINIVIL,ZESTRIL) 10 MG tablet TAKE 1 TABLET (10 MG TOTAL) BY MOUTH DAILY. 90 tablet 0  . metFORMIN (GLUCOPHAGE) 500 MG tablet TAKE 1 TABLET BY MOUTH EVERY DAY 90 tablet 1  . Multiple Vitamin (MULTIVITAMIN) tablet Take 1 tablet by mouth daily.    . OMEGA-3 FATTY ACIDS PO Take 1 capsule by mouth daily.    Marland Kitchen omeprazole (PRILOSEC OTC) 20 MG tablet Take 20 mg by mouth daily.    . sildenafil (REVATIO) 20 MG tablet Take 2-4 tablets (40-80 mg total) by mouth daily as needed (relations). 30 tablet 3  . tamsulosin (FLOMAX)  0.4 MG CAPS capsule Take 1 capsule (0.4 mg total) by mouth daily. 30 capsule 0  . VIAGRA 100 MG tablet TAKE 1/2 - 1 TALET BY MOUTH AS NEEDED FOR ERECTILE DYSFUNCTION 5 tablet 5   No facility-administered medications prior to visit.      Per HPI unless specifically indicated in ROS section below Review of Systems  Constitutional: Negative for activity change, appetite change, chills, fatigue, fever and unexpected weight change.  HENT: Negative for hearing loss.   Eyes: Negative for visual disturbance.  Respiratory: Negative for cough, chest tightness, shortness of breath and wheezing.   Cardiovascular: Negative for chest  pain, palpitations and leg swelling.  Gastrointestinal: Negative for abdominal distention, abdominal pain, blood in stool, constipation, diarrhea, nausea and vomiting.  Genitourinary: Negative for difficulty urinating and hematuria.  Musculoskeletal: Negative for arthralgias, myalgias and neck pain.  Skin: Negative for rash.  Neurological: Negative for dizziness, seizures, syncope and headaches.  Hematological: Negative for adenopathy. Does not bruise/bleed easily.  Psychiatric/Behavioral: Negative for dysphoric mood. The patient is not nervous/anxious.        Objective:    BP 122/76 (BP Location: Left Arm, Patient Position: Sitting, Cuff Size: Large)   Pulse 79   Temp 97.9 F (36.6 C) (Oral)   Ht _0  (1.778 m)   Wt 238 lb 4 oz (108.1 kg)   SpO2 95%   BMI 34.19 kg/m   Wt Readings from Last 3 Encounters:  06/03/17 238 lb 4 oz (108.1 kg)  10/05/16 242 lb (109.8 kg)  06/02/16 244 lb 4 oz (110.8 kg)    Physical Exam  Constitutional: He is oriented to person, place, and time. He appears well-developed and well-nourished. No distress.  HENT:  Head: Normocephalic and atraumatic.  Right Ear: Hearing, tympanic membrane, external ear and ear canal normal.  Left Ear: Hearing, tympanic membrane, external ear and ear canal normal.  Nose: Nose normal.  Mouth/Throat: Uvula is midline, oropharynx is clear and moist and mucous membranes are normal. No oropharyngeal exudate, posterior oropharyngeal edema or posterior oropharyngeal erythema.  Eyes: Pupils are equal, round, and reactive to light. Conjunctivae and EOM are normal. No scleral icterus.  Neck: Normal range of motion. Neck supple. Carotid bruit is not present. No thyromegaly present.  Cardiovascular: Normal rate, regular rhythm, normal heart sounds and intact distal pulses.  No murmur heard. Pulses:      Radial pulses are 2+ on the right side, and 2+ on the left side.  Pulmonary/Chest: Effort normal and breath sounds normal. No  respiratory distress. He has no wheezes. He has no rales.  Abdominal: Soft. Bowel sounds are normal. He exhibits no distension and no mass. There is no tenderness. There is no rebound and no guarding.  Musculoskeletal: Normal range of motion. He exhibits no edema.  See HPI for foot exam if done  Lymphadenopathy:    He has no cervical adenopathy.  Neurological: He is alert and oriented to person, place, and time.  CN grossly intact, station and gait intact  Skin: Skin is warm and dry. No rash noted.  Psychiatric: He has a normal mood and affect. His behavior is normal. Judgment and thought content normal.  Nursing note and vitals reviewed.  Diabetic Foot Exam - Simple   Simple Foot Form Diabetic Foot exam was performed with the following findings:  Yes 06/03/2017  9:21 AM  Visual Inspection No deformities, no ulcerations, no other skin breakdown bilaterally:  Yes Sensation Testing Intact to touch and monofilament testing bilaterally:  Yes Pulse Check Posterior Tibialis and Dorsalis pulse intact bilaterally:  Yes Comments     Results for orders placed or performed in visit on 05/27/17  PSA  Result Value Ref Range   PSA 1.11 0.10 - 4.00 ng/mL  Hemoglobin A1c  Result Value Ref Range   Hgb A1c MFr Bld 6.6 (H) 4.6 - 6.5 %  Lipid panel  Result Value Ref Range   Cholesterol 155 0 - 200 mg/dL   Triglycerides 137.0 0.0 - 149.0 mg/dL   HDL 30.20 (L) >39.00 mg/dL   VLDL 27.4 0.0 - 40.0 mg/dL   LDL Cholesterol 98 0 - 99 mg/dL   Total CHOL/HDL Ratio 5    NonHDL 125.14   Comprehensive metabolic panel  Result Value Ref Range   Sodium 139 135 - 145 mEq/L   Potassium 3.9 3.5 - 5.1 mEq/L   Chloride 105 96 - 112 mEq/L   CO2 29 19 - 32 mEq/L   Glucose, Bld 128 (H) 70 - 99 mg/dL   BUN 18 6 - 23 mg/dL   Creatinine, Ser 1.31 0.40 - 1.50 mg/dL   Total Bilirubin 0.8 0.2 - 1.2 mg/dL   Alkaline Phosphatase 47 39 - 117 U/L   AST 19 0 - 37 U/L   ALT 18 0 - 53 U/L   Total Protein 6.5 6.0 - 8.3  g/dL   Albumin 4.3 3.5 - 5.2 g/dL   Calcium 9.6 8.4 - 10.5 mg/dL   GFR 58.98 (L) >60.00 mL/min      Assessment & Plan:   Problem List Items Addressed This Visit    Abdominal aortic atherosclerosis (HCC)    Continue aspirin, statin.       Aortic dilatation (HCC)   Controlled type 2 diabetes mellitus without complication, without long-term current use of insulin (HCC)    Chronic, stable. Continue current regimen. Foot exam today. Eye exam 07/2016.      Dyslipidemia    Chronic, stable. Continue lipitor and fenofibrate and fish oil.  The 10-year ASCVD risk score Mikey Bussing DC Brooke Bonito., et al., 2013) is: 20.8%   Values used to calculate the score:     Age: 88 years     Sex: Male     Is Non-Hispanic African American: No     Diabetic: Yes     Tobacco smoker: No     Systolic Blood Pressure: 174 mmHg     Is BP treated: Yes     HDL Cholesterol: 30.2 mg/dL     Total Cholesterol: 155 mg/dL       Encounter for chronic pain management    Rare hydrocodone for kidney stones.       Encounter for general adult medical examination with abnormal findings - Primary    Preventative protocols reviewed and updated unless pt declined. Discussed healthy diet and lifestyle.       History of kidney stones   HTN (hypertension)    Chronic, stable. Continue current regimen.       Obesity, Class I, BMI 30-34.9    Reviewed healthy diet and lifestyle changes to affect sustainable weight loss. Encouraged continued walking routine.           No orders of the defined types were placed in this encounter.  No orders of the defined types were placed in this encounter.   Follow up plan: Return in about 1 year (around 06/04/2018) for annual exam, prior fasting for blood work.  Ria Bush, MD

## 2017-07-08 ENCOUNTER — Other Ambulatory Visit: Payer: Self-pay | Admitting: Family Medicine

## 2017-07-25 ENCOUNTER — Other Ambulatory Visit: Payer: Self-pay | Admitting: Family Medicine

## 2018-01-22 ENCOUNTER — Other Ambulatory Visit: Payer: Self-pay | Admitting: Family Medicine

## 2018-01-24 NOTE — Telephone Encounter (Signed)
Electronic refill request Sildenafil Last office visit 06/03/17 Sildenafil 20 mg last refill 06/02/16 #30/3 Viagra 100 mg last refill 11/25/16 #5/5 refills

## 2018-03-04 DIAGNOSIS — E119 Type 2 diabetes mellitus without complications: Secondary | ICD-10-CM | POA: Diagnosis not present

## 2018-03-04 LAB — HM DIABETES EYE EXAM

## 2018-04-07 ENCOUNTER — Encounter: Payer: Self-pay | Admitting: Family Medicine

## 2018-04-14 ENCOUNTER — Telehealth: Payer: Self-pay | Admitting: Family Medicine

## 2018-04-14 DIAGNOSIS — Z87442 Personal history of urinary calculi: Secondary | ICD-10-CM

## 2018-04-14 DIAGNOSIS — N200 Calculus of kidney: Secondary | ICD-10-CM

## 2018-04-14 NOTE — Telephone Encounter (Signed)
Called patient and told him his Referral was sent over to [facility].

## 2018-04-14 NOTE — Telephone Encounter (Signed)
Pt is requesting a referral for [facility] for kidney stones.

## 2018-04-14 NOTE — Telephone Encounter (Signed)
Referral placed.

## 2018-04-15 ENCOUNTER — Encounter: Payer: Self-pay | Admitting: Family Medicine

## 2018-04-15 MED ORDER — HYDROCODONE-ACETAMINOPHEN 5-325 MG PO TABS: 1.0000 | ORAL_TABLET | Freq: Three times a day (TID) | ORAL | 0 refills | Status: DC | PRN

## 2018-04-15 NOTE — Telephone Encounter (Signed)
[place] CSRS reviewed  ?

## 2018-04-21 ENCOUNTER — Other Ambulatory Visit: Payer: Self-pay

## 2018-04-21 ENCOUNTER — Ambulatory Visit
Admission: RE | Admit: 2018-04-21 | Discharge: 2018-04-21 | Disposition: A | Discharge status: Home / Self Care | Payer: BLUE CROSS/BLUE SHIELD | Source: Ambulatory Visit | Attending: Urology | Admitting: Urology

## 2018-04-21 ENCOUNTER — Other Ambulatory Visit: Payer: Self-pay | Admitting: Radiology

## 2018-04-21 ENCOUNTER — Ambulatory Visit
Admission: RE | Admit: 2018-04-21 | Discharge: 2018-04-21 | Disposition: A | Discharge status: Home / Self Care | Payer: BLUE CROSS/BLUE SHIELD | Source: Ambulatory Visit | Attending: Family Medicine | Admitting: Family Medicine

## 2018-04-21 ENCOUNTER — Encounter: Payer: Self-pay | Admitting: Urology

## 2018-04-21 ENCOUNTER — Telehealth: Payer: Self-pay | Admitting: Radiology

## 2018-04-21 ENCOUNTER — Ambulatory Visit: Payer: BLUE CROSS/BLUE SHIELD | Admitting: Urology

## 2018-04-21 VITALS — BP 136/78 | HR 73 | Ht 70.0 in | Wt 240.0 lb

## 2018-04-21 DIAGNOSIS — N200 Calculus of kidney: Secondary | ICD-10-CM

## 2018-04-21 DIAGNOSIS — N132 Hydronephrosis with renal and ureteral calculous obstruction: Secondary | ICD-10-CM | POA: Diagnosis not present

## 2018-04-21 DIAGNOSIS — R1032 Left lower quadrant pain: Secondary | ICD-10-CM | POA: Diagnosis not present

## 2018-04-21 DIAGNOSIS — N202 Calculus of kidney with calculus of ureter: Secondary | ICD-10-CM | POA: Diagnosis not present

## 2018-04-21 LAB — URINALYSIS, COMPLETE
Bilirubin, UA: NEGATIVE
Glucose, UA: NEGATIVE
Ketones, UA: NEGATIVE
LEUKOCYTES UA: NEGATIVE
Nitrite, UA: NEGATIVE
Specific Gravity, UA: >1.03 — ABNORMAL HIGH (ref 1.005–1.030)
Urobilinogen, Ur: 2 mg/dL — ABNORMAL HIGH (ref 0.2–1.0)
pH, UA: 6 (ref 5.0–7.5)

## 2018-04-21 LAB — MICROSCOPIC EXAMINATION

## 2018-04-21 NOTE — Telephone Encounter (Signed)
Notified patient of extracorporeal shockwave lithotripsy scheduled on 04/28/2018 at 6:15. Patient should arrive at that time to [facility] Day Surgery. Pre-op & discharge instructions for lithotripsy were provided to patient.    Advised patient to hold NSAIDS & aspirin products for 3 days prior to the procedure beginning on 04/25/2018.  Patient's questions were answered & he expresses understanding of these instructions.    Ranell Patrick, RN

## 2018-04-21 NOTE — Progress Notes (Signed)
04/21/2018 11:34 AM   Hessie Knows Jul 19, 1955 [id number]  Referring provider: Ria Bush, MD [place], [place] [address]  CC: Left lower quadrant pain, history of nephrolithiasis  HPI: I saw Mr. Olmo today in consultation for recurrent nephrolithiasis from Dr. Danise Mina.  Briefly, he is a 63 year old male with past medical history notable for diabetes, obesity, hypertension, and recurrent nephrolithiasis who presents with 2 weeks of intermittent severe left lower quadrant pain.  He reports this pain feels similar to his prior episodes of kidney stones.  He has passed numerous stone spontaneously, with the last one approximately 2 years ago.  He is also undergone shockwave lithotripsy twice, as well as ureteroscopy twice.  He did not tolerate his ureteral stent well in the past, and would like to avoid ureteroscopy and stent in the future if at all possible.  He denies any gross hematuria, fevers, chills, chest pain, or shortness of breath.  There are no aggravating or alleviating factors.   PMH: Past Medical History:  Diagnosis Date  . ED (erectile dysfunction)   . GERD (gastroesophageal reflux disease)   . History of kidney stones 2013   s/p lithotripsy  . HLD (hyperlipidemia)   . HTN (hypertension)   . Obesity   . T2DM (type 2 diabetes mellitus) ([place])    DSME at [facility] 01/2012    Surgical History: Past Surgical History:  Procedure Laterality Date  . CHOLECYSTECTOMY  2007  . COLONOSCOPY  11/2008   2 hyperplastic polyps [name])  . LITHOTRIPSY  2013   x 2   Allergies:  Allergies  Allergen Reactions  . Oxycodone Nausea And Vomiting    Family History: Family History  Problem Relation Age of Onset  . Cancer Father 63       lung, smoker  . Aneurysm Mother 64       brain  . Coronary artery disease Neg Hx   . Stroke Neg Hx   . Diabetes Neg Hx     Social History:  reports that he quit smoking about 11 years ago. He has never used smokeless  tobacco. He reports current alcohol use. He reports current drug use.  ROS: Please see flowsheet from today's date for complete review of systems.  Physical Exam: BP 136/78   Pulse 73   Ht 5\' 10"  (1.778 m)   Wt 240 lb (108.9 kg)   BMI 34.44 kg/m    Constitutional:  Alert and oriented, No acute distress. Cardiovascular: No clubbing, cyanosis, or edema. Respiratory: Normal respiratory effort, no increased work of breathing. GI: Abdomen is soft, nontender, nondistended, no abdominal masses GU: No CVA tenderness Lymph: No cervical or inguinal lymphadenopathy. Skin: No rashes, bruises or suspicious lesions. Neurologic: Grossly intact, no focal deficits, moving all 4 extremities. Psychiatric: Normal mood and affect.  Laboratory Data: Urinalysis today 3-10 RBCs, 0-5 WBCs, few bacteria, nitrite negative  Pertinent Imaging: I have personally reviewed the CT stone protocol and KUB today.  There is an 8 mm left distal ureteral stone(930HU, 13cm SSD) with severe upstream hydronephrosis and renal atrophy.  On prior scans, there was renal atrophy noted in 2017, but this has worsened.  There is a non-obstructing right upper pole 5 mm calculus.  Assessment & Plan:   In summary, the patient is a healthy 63 year old male with history of recurrent stone disease who presents with multi-week history of intermittent severe left groin pain.  CT scan today shows an 8 mm distal ureteral stone with significant upstream hydronephrosis.  We discussed various treatment options for urolithiasis including observation with or without medical expulsive therapy, shockwave lithotripsy (SWL), ureteroscopy and laser lithotripsy with stent placement, and percutaneous nephrolithotomy.  We discussed that management is based on stone size, location, density, patient co-morbidities, and patient preference.   Stones <71mm in size have a >80% spontaneous passage rate. Data surrounding the use of tamsulosin for medical  expulsive therapy is controversial, but meta analyses suggests it is most efficacious for distal stones between 5-56mm in size. Possible side effects include dizziness/lightheadedness, and retrograde ejaculation.  SWL has a lower stone free rate in a single procedure, but also a lower complication rate compared to ureteroscopy and avoids a stent and associated stent related symptoms. Possible complications include renal hematoma, steinstrasse, and need for additional treatment.  Ureteroscopy with laser lithotripsy and stent placement has a higher stone free rate than SWL in a single procedure, however increased complication rate including possible infection, ureteral injury, bleeding, and stent related morbidity. Common stent related symptoms include dysuria, urgency/frequency, and flank pain.  PCNL is the favored treatment for stones >2cm. It involves a small incision in the flank, with complete fragmentation of stones and removal. It has the highest stone free rate, but also the highest complication rate. Possible complications include bleeding, infection/sepsis, injury to surrounding organs including the pleura, and collecting system injury.   After an extensive discussion of the risks and benefits of the above treatment options, the patient would like to proceed with left SWL.  I counseled him at length that the stone free rate is lower with SWL versus ureteroscopy for this stone, however he would like to avoid a ureteral stent if at all possible.  I also discussed with him the possible need for additional procedures if follow-up imaging demonstrates residual ureteral stone fragments.  I also discussed with him the findings of renal atrophy, likely in the setting of recurrent episodes of long-term obstruction.  Will obtain BMP.  Schedule LEFT SWL next Thursday 3/19 Will need follow up KUB and ultimately renal ultrasound  B[place]ley [place], [place] [address] [place], [place] [address] [phone]

## 2018-04-21 NOTE — Patient Instructions (Signed)

## 2018-04-22 LAB — CBC
HEMATOCRIT: 44.1 % (ref 37.5–51.0)
Hemoglobin: 14.7 g/dL (ref 13.0–17.7)
MCH: 31.2 pg (ref 26.6–33.0)
MCHC: 33.3 g/dL (ref 31.5–35.7)
MCV: 94 fL (ref 79–97)
Platelets: 232 10*3/uL (ref 150–450)
RBC: 4.71 x10E6/uL (ref 4.14–5.80)
RDW: 12.4 % (ref 11.6–15.4)
WBC: 8.1 10*3/uL (ref 3.4–10.8)

## 2018-04-22 LAB — BASIC METABOLIC PANEL
BUN/Creatinine Ratio: 17 (ref 10–24)
BUN: 23 mg/dL (ref 8–27)
CO2: 20 mmol/L (ref 20–29)
CREATININE: 1.34 mg/dL — AB (ref 0.76–1.27)
Calcium: 10.2 mg/dL (ref 8.6–10.2)
Chloride: 104 mmol/L (ref 96–106)
GFR calc Af Amer: 65 mL/min/{1.73_m2} (ref >59)
GFR, EST NON AFRICAN AMERICAN: 56 mL/min/{1.73_m2} — AB (ref >59)
Glucose: 174 mg/dL — ABNORMAL HIGH (ref 65–99)
Potassium: 4.6 mmol/L (ref 3.5–5.2)
Sodium: 141 mmol/L (ref 134–144)

## 2018-04-23 LAB — URINE CULTURE: Organism ID, Bacteria: NO GROWTH

## 2018-04-28 ENCOUNTER — Encounter: Payer: Self-pay | Admitting: Emergency Medicine

## 2018-04-28 ENCOUNTER — Encounter
Admission: RE | Disposition: A | Discharge status: Home / Self Care | Payer: Self-pay | Source: Home / Self Care | Attending: Urology

## 2018-04-28 ENCOUNTER — Ambulatory Visit: Payer: BLUE CROSS/BLUE SHIELD

## 2018-04-28 ENCOUNTER — Ambulatory Visit
Admission: RE | Admit: 2018-04-28 | Discharge: 2018-04-28 | Disposition: A | Discharge status: Home / Self Care | Payer: BLUE CROSS/BLUE SHIELD | Attending: Urology | Admitting: Urology

## 2018-04-28 ENCOUNTER — Other Ambulatory Visit: Payer: Self-pay

## 2018-04-28 DIAGNOSIS — Z87891 Personal history of nicotine dependence: Secondary | ICD-10-CM | POA: Insufficient documentation

## 2018-04-28 DIAGNOSIS — E119 Type 2 diabetes mellitus without complications: Secondary | ICD-10-CM | POA: Insufficient documentation

## 2018-04-28 DIAGNOSIS — Z6834 Body mass index (BMI) 34.0-34.9, adult: Secondary | ICD-10-CM | POA: Insufficient documentation

## 2018-04-28 DIAGNOSIS — Z87442 Personal history of urinary calculi: Secondary | ICD-10-CM | POA: Insufficient documentation

## 2018-04-28 DIAGNOSIS — N201 Calculus of ureter: Secondary | ICD-10-CM | POA: Insufficient documentation

## 2018-04-28 DIAGNOSIS — I10 Essential (primary) hypertension: Secondary | ICD-10-CM | POA: Insufficient documentation

## 2018-04-28 DIAGNOSIS — E669 Obesity, unspecified: Secondary | ICD-10-CM | POA: Insufficient documentation

## 2018-04-28 DIAGNOSIS — N200 Calculus of kidney: Secondary | ICD-10-CM | POA: Diagnosis not present

## 2018-04-28 HISTORY — PX: EXTRACORPOREAL SHOCK WAVE LITHOTRIPSY: SHX1557

## 2018-04-28 LAB — GLUCOSE, CAPILLARY: Glucose-Capillary: 127 mg/dL — ABNORMAL HIGH (ref 70–99)

## 2018-04-28 SURGERY — LITHOTRIPSY, ESWL
Anesthesia: Moderate Sedation | Laterality: Left

## 2018-04-28 MED ORDER — ONDANSETRON HCL 2 MG/ML IV SOLN: 4.0000 mg | Freq: Once | INTRAVENOUS | Status: DC | PRN

## 2018-04-28 MED ORDER — DIAZEPAM 5 MG PO TABS
10.0000 mg | ORAL_TABLET | ORAL | Status: AC
  Administered 2018-04-28: 10 mg via ORAL

## 2018-04-28 MED ORDER — ONDANSETRON HCL 4 MG/2ML IJ SOLN
INTRAMUSCULAR | Status: AC
  Administered 2018-04-28: 4 mg via INTRAVENOUS
  Filled 2018-04-28: qty 2

## 2018-04-28 MED ORDER — DIPHENHYDRAMINE HCL 25 MG PO CAPS
25.0000 mg | ORAL_CAPSULE | ORAL | Status: AC
  Administered 2018-04-28: 25 mg via ORAL

## 2018-04-28 MED ORDER — CIPROFLOXACIN HCL 500 MG PO TABS
ORAL_TABLET | ORAL | Status: AC
  Administered 2018-04-28: 500 mg via ORAL
  Filled 2018-04-28: qty 1

## 2018-04-28 MED ORDER — DIPHENHYDRAMINE HCL 25 MG PO CAPS
ORAL_CAPSULE | ORAL | Status: AC
  Administered 2018-04-28: 25 mg via ORAL
  Filled 2018-04-28: qty 1

## 2018-04-28 MED ORDER — DIAZEPAM 5 MG PO TABS
ORAL_TABLET | ORAL | Status: AC
  Administered 2018-04-28: 10 mg via ORAL
  Filled 2018-04-28: qty 2

## 2018-04-28 MED ORDER — HYDROCODONE-ACETAMINOPHEN 5-325 MG PO TABS: 1.0000 | ORAL_TABLET | ORAL | 0 refills | Status: AC | PRN

## 2018-04-28 MED ORDER — SODIUM CHLORIDE 0.9 % IV SOLN
INTRAVENOUS | Status: DC
  Administered 2018-04-28: 07:00:00 via INTRAVENOUS

## 2018-04-28 MED ORDER — CIPROFLOXACIN HCL 500 MG PO TABS
500.0000 mg | ORAL_TABLET | ORAL | Status: AC
  Administered 2018-04-28: 500 mg via ORAL

## 2018-04-28 MED ORDER — TAMSULOSIN HCL 0.4 MG PO CAPS: 0.4000 mg | ORAL_CAPSULE | Freq: Every day | ORAL | 0 refills | Status: DC

## 2018-04-28 MED ORDER — ONDANSETRON HCL 4 MG/2ML IJ SOLN
4.0000 mg | Freq: Once | INTRAMUSCULAR | Status: AC | PRN
  Administered 2018-04-28: 4 mg via INTRAVENOUS

## 2018-04-28 NOTE — Discharge Instructions (Signed)
FOLLOW [facility] INSTRUCTION SHEET AS REVIEWED.  AMBULATORY SURGERY  DISCHARGE INSTRUCTIONS   1) The drugs that you were given will stay in your system until tomorrow so for the next 24 hours you should not:  A) Drive an automobile B) Make any legal decisions C) Drink any alcoholic beverage   2) You may resume regular meals tomorrow.  Today it is better to start with liquids and gradually work up to solid foods.  You may eat anything you prefer, but it is better to start with liquids, then soup and crackers, and gradually work up to solid foods.   3) Please notify your doctor immediately if you have any unusual bleeding, trouble breathing, redness and pain at the surgery site, drainage, fever, or pain not relieved by medication.    4) Additional Instructions:        Please contact your physician with any problems or Same Day Surgery at [phone], Monday through Friday 6 am to 4 pm, or [place] at [facility] number at [phone].

## 2018-04-28 NOTE — H&P (Signed)
UROLOGY H&P UPDATE  Agree with prior H&P dated 04/21/2018. LEFT 33mm distal ureteral stone. Has not tolerated stents well in the past and he elects SWL.  Cardiac: RRR Lungs: CTA bilaterally  Laterality: LEFT Procedure: LEFT SWL   LEFT 65mm distal ureteral stone, 930HU, 13cm SSD.  Urine: 3/12 urine cx no growth  Informed consent obtained, we specifically discussed the risks of bleeding, infection, post-operative pain, need for additional procedures, steinstrasse, and need for close follow up with KUB and U/S.  Billey Co, MD 04/28/2018

## 2018-05-02 ENCOUNTER — Ambulatory Visit (INDEPENDENT_AMBULATORY_CARE_PROVIDER_SITE_OTHER): Payer: BLUE CROSS/BLUE SHIELD | Admitting: Family Medicine

## 2018-05-02 ENCOUNTER — Encounter: Payer: Self-pay | Admitting: Family Medicine

## 2018-05-02 ENCOUNTER — Other Ambulatory Visit: Payer: Self-pay

## 2018-05-02 DIAGNOSIS — J029 Acute pharyngitis, unspecified: Secondary | ICD-10-CM | POA: Diagnosis not present

## 2018-05-02 NOTE — Progress Notes (Signed)
    This service was provided by telemedicine.  Phone Visit performed 05/02/18   Patient consented to telephone encounter: yes   Location of patient: home Location of MD: [facility] @ [facility] Name of referring provider: N/A  Names per persons and role in encounter: Provider: Ria Bush, MD  Patient: William Johns Other:   Time on call: 11:17am - 11:25am  Subjective: CC: ST  HPI:  3-4 wk h/o R sided ST, present even prior to recent cruise. No fevers/chills, cough, sneezing, no noted swollen glands., ear or tooth pain. No sick contacts at home. No URI sxs. No GERD sxs. Regularly takes omeprazole 20mg  daily. No diet changes recently. No trismus.   GF yesterday started complaining of ST.   Recent L 69mm kidney stone treated by urology with ESWL, passed kidney stones afterwards.   Meds and allergies reviewed.   ROS: Per HPI unless specifically indicated in ROS section   Objective:  No physical exam or vital signs collected.   Assessment/Plan:  Sore throat Not consistent with viral or bacterial pharyngitis. No h/o allergic rhinitis. ?irritant pharyngitis vs silent GERD. rec supportive care - salt water gargles, honey with lemon, herbal teas. rec increase omeprazole OTC to 20mg  BID. If no better, rec trial off ACEI for 2-3 wks. Will reassess at CPE next month. Update sooner if worsening symptoms. Pt agrees with plan.   Lab Orders  No laboratory test(s) ordered today   No orders of the defined types were placed in this encounter.  Ria Bush, MD

## 2018-05-02 NOTE — Assessment & Plan Note (Signed)
Not consistent with viral or bacterial pharyngitis. No h/o allergic rhinitis. ?irritant pharyngitis vs silent GERD. rec supportive care - salt water gargles, honey with lemon, herbal teas. rec increase omeprazole OTC to 20mg  BID. If no better, rec trial off ACEI for 2-3 wks. Will reassess at CPE next month. Update sooner if worsening symptoms. Pt agrees with plan.

## 2018-05-11 ENCOUNTER — Telehealth: Payer: Self-pay | Admitting: Urology

## 2018-05-11 DIAGNOSIS — C099 Malignant neoplasm of tonsil, unspecified: Secondary | ICD-10-CM

## 2018-05-11 HISTORY — DX: Malignant neoplasm of tonsil, unspecified: C09.9

## 2018-05-11 NOTE — Telephone Encounter (Signed)
Spoke with patient per Dr.Sninsky patient still needs KUB. Patient will keep appointment scheduled for tomorrow and KUB. Given directions-verbalized understanding.

## 2018-05-11 NOTE — Telephone Encounter (Signed)
Patient called today.  He had lithotripsy on May 03, 2022.  He has passed his stone and is having no pain or complications.  He is concerned about coming to the hospital for his 2 week KUB and follow up that is scheduled for tomorrow.  He states that he will if Dr. Diamantina Providence thinks its absolutely necessary.   Please advise.

## 2018-05-12 ENCOUNTER — Encounter: Payer: Self-pay | Admitting: Urology

## 2018-05-12 ENCOUNTER — Ambulatory Visit
Admission: RE | Admit: 2018-05-12 | Discharge: 2018-05-12 | Disposition: A | Discharge status: Home / Self Care | Payer: BLUE CROSS/BLUE SHIELD | Source: Ambulatory Visit | Attending: Urology | Admitting: Urology

## 2018-05-12 ENCOUNTER — Other Ambulatory Visit: Payer: Self-pay

## 2018-05-12 ENCOUNTER — Ambulatory Visit (INDEPENDENT_AMBULATORY_CARE_PROVIDER_SITE_OTHER): Payer: BLUE CROSS/BLUE SHIELD | Admitting: Urology

## 2018-05-12 VITALS — BP 137/89 | HR 79 | Ht 71.0 in | Wt 237.0 lb

## 2018-05-12 DIAGNOSIS — N200 Calculus of kidney: Secondary | ICD-10-CM

## 2018-05-12 NOTE — Patient Instructions (Signed)

## 2018-05-12 NOTE — Progress Notes (Signed)
   05/12/2018 12:54 PM   Hessie Knows 10-10-1955 [id number]  Reason for visit: Follow up LEFT SWL  HPI: I saw Mr. Rideout back in urology clinic for follow-up after left shockwave lithotripsy for a 1 cm distal ureteral stone on 04/28/2018.  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  He passed a number of large fragments after shockwave lithotripsy, with complete resolution of his pain.  He denies any flank pain or fevers at this time.  I personally reviewed his KUB today which shows no residual fragments.  He has a known 6 mm right upper pole non-obstructing renal stone.  63 year old male with left atrophic kidney and baseline creatinine 1.3, EGFR 56, status post successful left shockwave lithotripsy for a large distal ureteral stone.  He has had complete resolution of his pain, and passed a number of large fragments.  We discussed general stone prevention strategies including adequate hydration with goal of producing 2.5 L of urine daily, increasing citric acid intake, increasing calcium intake during high oxalate meals, minimizing animal protein, and decreasing salt intake. Information about dietary recommendations given today.   -RTC 3 months with renal ultrasound and BMP -Anticipate some residual left hydro nephrosis in the setting of an atrophic kidney with chronic obstruction -Stone sent for analysis -Consider elective right SWL in the future if patient desires  A total of 10 minutes were spent face-to-face with the patient, greater than 50% was spent in patient education, counseling, and coordination of care regarding postop expectations and stone prevention.   Billey Co, [facility] [address], [place], [place] [address] [phone]

## 2018-05-16 ENCOUNTER — Other Ambulatory Visit: Payer: Self-pay | Admitting: Urology

## 2018-05-16 NOTE — Progress Notes (Signed)
His stone is calcium oxalate, the most common type of stone.  General stone prevention strategies including adequate hydration with goal of producing 2.5 L of urine daily, increasing citric acid intake, increasing calcium intake during high oxalate meals, minimizing animal protein, and decreasing salt intake.   William Madrid, MD 05/16/2018

## 2018-05-18 ENCOUNTER — Telehealth: Payer: Self-pay

## 2018-05-18 NOTE — Telephone Encounter (Signed)
-----   Message from Billey Co, MD sent at 05/16/2018  5:50 PM EDT -----   ----- Message ----- From: Noralyn Pick Sent: 05/16/2018   5:27 PM EDT To: Billey Co, MD

## 2018-05-18 NOTE — Telephone Encounter (Signed)
His stone is calcium oxalate, the most common type of stone.  General stone prevention strategies including adequate hydration with goal of producing 2.5 L of urine daily, increasing citric acid intake, increasing calcium intake during high oxalate meals, minimizing animal protein, and decreasing salt intake.   Nickolas Madrid, MD 05/16/2018  Patient notified

## 2018-05-19 ENCOUNTER — Telehealth: Payer: Self-pay | Admitting: Family Medicine

## 2018-05-19 NOTE — Telephone Encounter (Signed)
Will you please address this in Dr. Synthia Innocent absence?  Can this be a virtual visit?

## 2018-05-19 NOTE — Telephone Encounter (Addendum)
Scheduled pt for 05/23/18 at 9:30. Pt aware. Fyi to Dr. Darnell Level.

## 2018-05-19 NOTE — Telephone Encounter (Signed)
Patient was seen the end of last month for his throat.  Patient has been following the instructions Dr. Darnell Level gave him.  Patient said he feels like he has a knot in his throat. Patient said he does have pain in his throat, but it's not severe enough to keep him awake at night.  He's very concerned that it has been going on for so long. Patient wants someone to look at his throat as soon as possible. Please call patient.

## 2018-05-19 NOTE — Telephone Encounter (Signed)
I think this may need to be an in office visit with PCP when possible.

## 2018-05-19 NOTE — Telephone Encounter (Signed)
Noted. Will see then.  

## 2018-05-23 ENCOUNTER — Ambulatory Visit
Admission: RE | Admit: 2018-05-23 | Discharge: 2018-05-23 | Disposition: A | Discharge status: Home / Self Care | Payer: BLUE CROSS/BLUE SHIELD | Source: Ambulatory Visit | Attending: Family Medicine | Admitting: Family Medicine

## 2018-05-23 ENCOUNTER — Other Ambulatory Visit: Payer: Self-pay

## 2018-05-23 ENCOUNTER — Telehealth: Payer: Self-pay | Admitting: Family Medicine

## 2018-05-23 ENCOUNTER — Ambulatory Visit: Payer: BLUE CROSS/BLUE SHIELD | Admitting: Family Medicine

## 2018-05-23 ENCOUNTER — Telehealth: Payer: Self-pay

## 2018-05-23 ENCOUNTER — Encounter: Payer: Self-pay | Admitting: Family Medicine

## 2018-05-23 VITALS — BP 120/70 | HR 65 | Temp 97.6°F | Ht 70.0 in | Wt 236.3 lb

## 2018-05-23 DIAGNOSIS — R220 Localized swelling, mass and lump, head: Secondary | ICD-10-CM | POA: Insufficient documentation

## 2018-05-23 DIAGNOSIS — J358 Other chronic diseases of tonsils and adenoids: Secondary | ICD-10-CM

## 2018-05-23 DIAGNOSIS — R221 Localized swelling, mass and lump, neck: Secondary | ICD-10-CM | POA: Diagnosis not present

## 2018-05-23 DIAGNOSIS — J029 Acute pharyngitis, unspecified: Secondary | ICD-10-CM

## 2018-05-23 DIAGNOSIS — J351 Hypertrophy of tonsils: Secondary | ICD-10-CM | POA: Diagnosis not present

## 2018-05-23 HISTORY — DX: Other chronic diseases of tonsils and adenoids: J35.8

## 2018-05-23 LAB — POCT RAPID STREP A (OFFICE): Rapid Strep A Screen: NEGATIVE

## 2018-05-23 MED ORDER — IOHEXOL 300 MG/ML  SOLN
75.0000 mL | Freq: Once | INTRAMUSCULAR | Status: AC | PRN
  Administered 2018-05-23: 11:00:00 75 mL via INTRAVENOUS

## 2018-05-23 NOTE — Telephone Encounter (Signed)
Stephanie CT at [facility] called report for CT neck; report taken to Dr Darnell Level (also in [place]) and Dr Darnell Level spoke with Colletta [place].

## 2018-05-23 NOTE — Progress Notes (Signed)
BP 120/70 (BP Location: Left Arm, Patient Position: Sitting, Cuff Size: Normal)   Pulse 65   Temp 97.6 F (36.4 C) (Oral)   Ht _0  (1.778 m)   Wt 236 lb 5 oz (107.2 kg)   SpO2 96%   BMI 33.91 kg/m    CC: throat discomfort Subjective:    Patient ID: William Johns, male    DOB: Jun 25, 1955, 63 y.o.   MRN: [id number]  HPI: William Johns is a 63 y.o. male presenting on 05/23/2018 for Sore Throat (States he still has sore throat. Feels like a knot in his throat. Was seen about 3 wks ago. Concerned no improvement. )    See prior note for details.  5-6 wk h/o R sided ST "feels like knot in throat". Describes both soreness and fullness sensation. No dry cough. No other localizing symptoms.  No fevers/chills, ear or toth pain, dysphagia, unexpected weight loss. Appetite ok.   Tried salt water gargles, honey, throat lozenges. Symptoms persist.   Ex smoker - quit 2009.  Regularly on omeprazole.  Previously treated for possible silent GERD with omeprazole 16m BID - No improvement.   Did not try off ACEI for 2 wks.       Relevant past medical, surgical, family and social history reviewed and updated as indicated. Interim medical history since our last visit reviewed. Allergies and medications reviewed and updated. Outpatient Medications Prior to Visit  Medication Sig Dispense Refill  . aspirin 81 MG tablet Take 81 mg by mouth daily.    .Marland Kitchenatorvastatin (LIPITOR) 40 MG tablet TAKE 1 TABLET BY MOUTH EVERY DAY 90 tablet 3  . Blood Glucose Monitoring Suppl (BAYER CONTOUR MONITOR) w/Device KIT by Does not apply route. Use to check sugar daily and as needed Dx: E11.65    . fenofibrate 160 MG tablet TAKE 1 TABLET (160 MG TOTAL) BY MOUTH DAILY. 90 tablet 3  . glucose blood test strip 1 each by Other route as needed for other. Use as instructed to check sugar once daily and as needed. E11.65 **Contour**    . Lancets MISC by Does not apply route. Use as directed to check sugar once daily and as  needed. Dx. E11.65 **Contour**    . lisinopril (PRINIVIL,ZESTRIL) 10 MG tablet TAKE 1 TABLET BY MOUTH EVERY DAY 90 tablet 3  . metFORMIN (GLUCOPHAGE) 500 MG tablet TAKE 1 TABLET BY MOUTH EVERY DAY 90 tablet 2  . Multiple Vitamin (MULTIVITAMIN) tablet Take 1 tablet by mouth daily.    . OMEGA-3 FATTY ACIDS PO Take 1 capsule by mouth daily.    .Marland Kitchenomeprazole (PRILOSEC OTC) 20 MG tablet Take 20 mg by mouth daily.    . tamsulosin (FLOMAX) 0.4 MG CAPS capsule Take 1 capsule (0.4 mg total) by mouth daily after supper. 30 capsule 0   No facility-administered medications prior to visit.      Per HPI unless specifically indicated in ROS section below Review of Systems Objective:    BP 120/70 (BP Location: Left Arm, Patient Position: Sitting, Cuff Size: Normal)   Pulse 65   Temp 97.6 F (36.4 C) (Oral)   Ht _1  (1.778 m)   Wt 236 lb 5 oz (107.2 kg)   SpO2 96%   BMI 33.91 kg/m   Wt Readings from Last 3 Encounters:  05/23/18 236 lb 5 oz (107.2 kg)  05/12/18 237 lb (107.5 kg)  04/28/18 240 lb (108.9 kg)    Physical Exam Vitals signs and nursing note reviewed.  Constitutional:      General: He is not in acute distress.    Appearance: Normal appearance. He is well-developed. He is not ill-appearing.  HENT:     Head: Normocephalic and atraumatic.     Right Ear: Hearing, tympanic membrane, ear canal and external ear normal.     Left Ear: Hearing, tympanic membrane, ear canal and external ear normal.     Nose: Nose normal. No mucosal edema or rhinorrhea.     Right Sinus: No maxillary sinus tenderness or frontal sinus tenderness.     Left Sinus: No maxillary sinus tenderness or frontal sinus tenderness.     Mouth/Throat:     Mouth: Mucous membranes are moist.     Pharynx: Oropharynx is clear. Uvula midline. No oropharyngeal exudate or posterior oropharyngeal erythema.     Tonsils: No tonsillar abscesses. 2+ on the right. 0 on the left.     Comments: L tonsil absent R tonsillar mass present   Eyes:     General: No scleral icterus.    Conjunctiva/sclera: Conjunctivae normal.     Pupils: Pupils are equal, round, and reactive to light.  Neck:     Musculoskeletal: Normal range of motion and neck supple.  Lymphadenopathy:     Head:     Right side of head: Tonsillar (shotty) adenopathy present. No submental, submandibular, preauricular or posterior auricular adenopathy.     Left side of head: No submental, submandibular, tonsillar, preauricular or posterior auricular adenopathy.     Cervical: No cervical adenopathy.  Skin:    General: Skin is warm and dry.     Findings: No rash.  Neurological:     Mental Status: He is alert.       Results for orders placed or performed in visit on 05/23/18  POCT rapid strep A  Result Value Ref Range   Rapid Strep A Screen Negative Negative    Lab Results  Component Value Date   CREATININE 1.34 (H) 04/21/2018   BUN 23 04/21/2018   NA 141 04/21/2018   K 4.6 04/21/2018   CL 104 04/21/2018   CO2 20 04/21/2018    Lab Results  Component Value Date   HGBA1C 6.6 (H) 05/27/2017    Assessment & Plan:   Problem List Items Addressed This Visit    Tonsillar mass - Primary    Concern for R sided tonsillar mass. Will urgently refer to ENT for further evaluation. Pt agrees with plan.  I spoke with ENT who recommended neck CT and then they will evaluate patient. Appreciate ENT care.       Relevant Orders   Ambulatory referral to ENT   CT Soft Tissue Neck W Contrast   Sore throat   Relevant Orders   POCT rapid strep A (Completed)    Other Visit Diagnoses    Localized swelling, mass or lump of neck       Relevant Orders   CT Soft Tissue Neck W Contrast       No orders of the defined types were placed in this encounter.  Orders Placed This Encounter  Procedures  . CT Soft Tissue Neck W Contrast    Standing Status:   Future    Standing Expiration Date:   08/22/2019    Order Specific Question:   If indicated for the ordered  procedure, I authorize the administration of contrast media per Radiology protocol    Answer:   Yes    Order Specific Question:   Preferred imaging location?  Answer:   [facility]    Order Specific Question:   Radiology Contrast Protocol - do NOT remove file path    Answer:   [email or web address]  . Ambulatory referral to ENT    Referral Priority:   Urgent    Referral Type:   Consultation    Referral Reason:   Specialty Services Required    Requested Specialty:   Otolaryngology    Number of Visits Requested:   1  . POCT rapid strep A    Follow up plan: No follow-ups on file.  Ria Bush, MD

## 2018-05-23 NOTE — Patient Instructions (Signed)
You may have mass on right tonsil - we will refer you to ENT. Rosaria Ferries will call you within the hour to get this set up.

## 2018-05-23 NOTE — Telephone Encounter (Signed)
Spoke with patient. Has ENT appointment tomorrow morning.

## 2018-05-23 NOTE — Telephone Encounter (Signed)
Yes spoke with patient. Thank you.

## 2018-05-23 NOTE — Assessment & Plan Note (Addendum)
Concern for R sided tonsillar mass. Will urgently refer to ENT for further evaluation. Pt agrees with plan.  I spoke with ENT who recommended neck CT and then they will evaluate patient. Appreciate ENT care.

## 2018-05-23 NOTE — Telephone Encounter (Signed)
Best number [phone] Carloyn Manner @ [facility]  Wanted to make sure you received ct results for this pt  This was done today

## 2018-05-24 ENCOUNTER — Other Ambulatory Visit: Payer: Self-pay | Admitting: Unknown Physician Specialty

## 2018-05-24 DIAGNOSIS — R221 Localized swelling, mass and lump, neck: Secondary | ICD-10-CM

## 2018-05-24 DIAGNOSIS — D3705 Neoplasm of uncertain behavior of pharynx: Secondary | ICD-10-CM | POA: Diagnosis not present

## 2018-05-24 DIAGNOSIS — R070 Pain in throat: Secondary | ICD-10-CM | POA: Diagnosis not present

## 2018-05-30 ENCOUNTER — Other Ambulatory Visit: Payer: Self-pay | Admitting: Student

## 2018-05-31 ENCOUNTER — Other Ambulatory Visit: Payer: Self-pay

## 2018-05-31 ENCOUNTER — Ambulatory Visit
Admission: RE | Admit: 2018-05-31 | Discharge: 2018-05-31 | Disposition: A | Discharge status: Home / Self Care | Payer: BLUE CROSS/BLUE SHIELD | Source: Ambulatory Visit | Attending: Unknown Physician Specialty | Admitting: Unknown Physician Specialty

## 2018-05-31 DIAGNOSIS — C099 Malignant neoplasm of tonsil, unspecified: Secondary | ICD-10-CM | POA: Diagnosis not present

## 2018-05-31 DIAGNOSIS — R221 Localized swelling, mass and lump, neck: Secondary | ICD-10-CM

## 2018-05-31 DIAGNOSIS — R590 Localized enlarged lymph nodes: Secondary | ICD-10-CM | POA: Diagnosis not present

## 2018-05-31 LAB — GLUCOSE, CAPILLARY: Glucose-Capillary: 111 mg/dL — ABNORMAL HIGH (ref 70–99)

## 2018-05-31 MED ORDER — SODIUM CHLORIDE 0.9 % IV SOLN: INTRAVENOUS | Status: DC

## 2018-05-31 MED ORDER — BUPIVACAINE HCL (PF) 0.25 % IJ SOLN
INTRAMUSCULAR | Status: AC
  Filled 2018-05-31: qty 30

## 2018-05-31 NOTE — Procedures (Signed)
R cervical node bx without difficulty  Complications:  None  Blood Loss: none  See dictation in [facility]

## 2018-05-31 NOTE — Progress Notes (Signed)
Wrong  patient charted,

## 2018-05-31 NOTE — Discharge Instructions (Signed)
Open Lymph Node Biopsy, Care After  This sheet gives you information about how to care for yourself after your procedure. Your health care provider may also give you more specific instructions. If you have problems or questions, contact your health care provider.  What can I expect after the procedure?  After the procedure, it is common to have:   Bruising.   Soreness.   Mild swelling.  Follow these instructions at home:  Medicines   Take over-the-counter and prescription medicines only as told by your health care provider.   If you were prescribed an antibiotic medicine, take it as told by your health care provider. Do not stop taking the antibiotic even if you start to feel better.  Incision care     Follow instructions from your health care provider about how to take care of your incision. Make sure you:  ? Wash your hands with soap and water before and after you change your bandage (dressing). If soap and water are not available, use hand sanitizer.  ? Change your dressing as told by your health care provider.  ? Leave stitches (sutures), skin glue, or adhesive strips in place. These skin closures may need to stay in place for 2 weeks or longer. If adhesive strip edges start to loosen and curl up, you may trim the loose edges. Do not remove adhesive strips completely unless your health care provider tells you to do that.   Check your incision area every day for signs of infection. Check for:  ? More redness, swelling, or pain.  ? Fluid or blood.  ? Warmth.  ? Pus or a bad smell.  Driving   Do not drive for 24 hours if you were given a sedative during your procedure.   Do not drive or use heavy machinery while taking prescription pain medicine.  General instructions   Return to your normal activities as told by your health care provider. Ask your health care provider what activities are safe for you.   Do not take baths, swim, or use a hot tub until your health care provider approves. Ask your health  care provider if you may take showers. You may only be allowed to take sponge baths.   Keep all follow-up visits as told by your health care provider. This is important.  Contact a health care provider if:   You have more redness, swelling, or pain around your incision.   You have fluid or blood coming from your incision.   Your incision feels warm to the touch.   You have pus or a bad smell coming from your incision.   You have a fever.   You have pain or numbness that gets worse or lasts longer than a few days.  Summary   After a lymph node biopsy, it is common to have bruising, soreness, and mild swelling.   Follow your health care provider's instructions about taking care of yourself at home. You will be told how to take medicines, take care of your incision, and check for infection.   Return to your normal activities as told by your health care provider. Ask your health care provider what activities are safe for you.   Contact a health care provider if you have more redness, swelling, or pain around your incision, you have a fever, or you have worsening pain or numbness.  This information is not intended to replace advice given to you by your health care provider. Make sure you discuss any questions   you have with your health care provider.  Document Released: 02/22/2015 Document Revised: 09/02/2017 Document Reviewed: 09/02/2017  Elsevier Interactive Patient Education  2019 [employer]

## 2018-06-02 ENCOUNTER — Other Ambulatory Visit: Payer: Self-pay | Admitting: Anatomic Pathology & Clinical Pathology

## 2018-06-02 LAB — SURGICAL PATHOLOGY

## 2018-06-06 ENCOUNTER — Other Ambulatory Visit: Payer: BLUE CROSS/BLUE SHIELD

## 2018-06-06 ENCOUNTER — Other Ambulatory Visit: Payer: Self-pay | Admitting: Unknown Physician Specialty

## 2018-06-06 DIAGNOSIS — IMO0002 Reserved for concepts with insufficient information to code with codable children: Secondary | ICD-10-CM

## 2018-06-06 DIAGNOSIS — C799 Secondary malignant neoplasm of unspecified site: Secondary | ICD-10-CM

## 2018-06-07 DIAGNOSIS — D3705 Neoplasm of uncertain behavior of pharynx: Secondary | ICD-10-CM | POA: Diagnosis not present

## 2018-06-07 DIAGNOSIS — R070 Pain in throat: Secondary | ICD-10-CM | POA: Diagnosis not present

## 2018-06-09 ENCOUNTER — Encounter: Payer: BLUE CROSS/BLUE SHIELD | Admitting: Family Medicine

## 2018-06-09 ENCOUNTER — Encounter
Admission: RE | Admit: 2018-06-09 | Discharge: 2018-06-09 | Disposition: A | Discharge status: Home / Self Care | Payer: BLUE CROSS/BLUE SHIELD | Source: Ambulatory Visit | Attending: Unknown Physician Specialty | Admitting: Unknown Physician Specialty

## 2018-06-09 ENCOUNTER — Other Ambulatory Visit: Payer: BLUE CROSS/BLUE SHIELD

## 2018-06-09 ENCOUNTER — Other Ambulatory Visit: Payer: Self-pay

## 2018-06-09 DIAGNOSIS — C799 Secondary malignant neoplasm of unspecified site: Secondary | ICD-10-CM

## 2018-06-09 DIAGNOSIS — IMO0002 Reserved for concepts with insufficient information to code with codable children: Secondary | ICD-10-CM

## 2018-06-09 DIAGNOSIS — C760 Malignant neoplasm of head, face and neck: Secondary | ICD-10-CM | POA: Diagnosis not present

## 2018-06-09 LAB — GLUCOSE, CAPILLARY: Glucose-Capillary: 125 mg/dL — ABNORMAL HIGH (ref 70–99)

## 2018-06-09 MED ORDER — FLUDEOXYGLUCOSE F - 18 (FDG) INJECTION
13.4300 | Freq: Once | INTRAVENOUS | Status: AC | PRN
  Administered 2018-06-09: 10:00:00 13.43 via INTRAVENOUS

## 2018-06-09 NOTE — Progress Notes (Signed)
Tumor Board Documentation  Haytham Maher was presented by Dr [name] at our Tumor Board on 06/09/2018, which included representatives from medical oncology, radiation oncology, surgical oncology, surgical, radiology, pathology, navigation, genetics, internal medicine, research, pulmonology.  Nicklaus currently presents for [facility], for new positive pathology with history of the following treatments: active survellience, surgical intervention(s).  Additionally, we reviewed previous medical and familial history, history of present illness, and recent lab results along with all available histopathologic and imaging studies. The tumor board considered available treatment options and made the following recommendations: Concurrent chemo-radiation therapy Refer to Medical and Radiation Oncology  The following procedures/referrals were also placed: No orders of the defined types were placed in this encounter.   Clinical Trial Status: not discussed   Staging used: AJCC Stage Group  AJCC Staging:       Group: Squamous Cell Carcinoma of Tonsil   National site-specific guidelines NCCN were discussed with respect to the case.  Tumor board is a meeting of clinicians from various specialty areas who evaluate and discuss patients for whom a multidisciplinary approach is being considered. Final determinations in the plan of care are those of the provider(s). The responsibility for follow up of recommendations given during tumor board is that of the provider.   Today's extended care, comprehensive team conference, Vicky was not present for the discussion and was not examined.   Multidisciplinary Tumor Board is a multidisciplinary case peer review process.  Decisions discussed in the Multidisciplinary Tumor Board reflect the opinions of the specialists present at the conference without having examined the patient.  Ultimately, treatment and diagnostic decisions rest with the primary provider(s) and the patient.

## 2018-06-12 ENCOUNTER — Encounter: Payer: Self-pay | Admitting: Oncology

## 2018-06-12 DIAGNOSIS — C099 Malignant neoplasm of tonsil, unspecified: Secondary | ICD-10-CM | POA: Insufficient documentation

## 2018-06-12 NOTE — Progress Notes (Signed)
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  INTERVAL HISTORY: Patient is a 63 year old male who noticed a persistent sore throat over the past 6 to 8 weeks.  Subsequent evaluation, imaging, and biopsy revealed the above-stated malignancy.  He currently feels well and is asymptomatic.  He has no neurologic complaints.  He denies any recent fevers or illnesses.  He has a good appetite and denies weight loss.  He has no chest pain, shortness of breath, cough, or hemoptysis.  He has no nausea, vomiting, constipation, or diarrhea.  He has no urinary complaints.  Patient feels at his baseline offers no further specific complaints today.    REVIEW OF SYSTEMS:   Review of Systems  Constitutional: Negative.  Negative for fever, malaise/fatigue and weight loss.  HENT: Positive for sore throat.   Respiratory: Negative.  Negative for cough and shortness of breath.   Cardiovascular: Negative.  Negative for chest pain and leg swelling.  Gastrointestinal: Negative.  Negative for abdominal pain.  Genitourinary: Negative.  Negative for dysuria.  Musculoskeletal: Negative.  Negative for back pain.  Skin: Negative.  Negative for rash.  Neurological: Negative.  Negative for speech change, weakness and headaches.  Psychiatric/Behavioral: The patient is not nervous/anxious.     As per HPI. Otherwise, a complete review of systems is negative.  PAST MEDICAL HISTORY: Past Medical History:  Diagnosis Date   ED (erectile dysfunction)    GERD (gastroesophageal reflux disease)    History of kidney stones 2013   s/p lithotripsy   HLD (hyperlipidemia)    HTN (hypertension)    Obesity    T2DM (type 2 diabetes mellitus) ([place])    DSME at [facility] 01/2012    Tonsillar mass 05/23/2018    PAST SURGICAL HISTORY: Past Surgical History:  Procedure Laterality Date   CHOLECYSTECTOMY  2007   COLONOSCOPY  11/2008   2 hyperplastic polyps [name])   EXTRACORPOREAL SHOCK WAVE LITHOTRIPSY Left 04/28/2018   Procedure: EXTRACORPOREAL SHOCK WAVE LITHOTRIPSY (ESWL);  Surgeon: Billey Co, MD;  Location: [facility] ORS;  Service: Urology;  Laterality: Left;   LITHOTRIPSY  2013   x 2    FAMILY HISTORY: Family History  Problem Relation Age of Onset   Cancer Father 59       lung, smoker   Aneurysm Mother 43       brain   Coronary artery disease Neg Hx    Stroke Neg Hx    Diabetes Neg Hx     ADVANCED DIRECTIVES (Y/N):  N  HEALTH MAINTENANCE: Social History   Tobacco Use   Smoking status: Former Smoker    Last attempt to quit: 02/10/2007    Years since quitting: 11.3   Smokeless tobacco: Never Used  Substance Use Topics   Alcohol use: Yes    Alcohol/week: 0.0 standard drinks    Comment: 5 drinks/week    Drug use: Yes    Comment: rare MJ     Colonoscopy:  PAP:  Bone density:  Lipid panel:  Allergies  Allergen Reactions   Oxycodone Nausea And Vomiting    Current Outpatient Medications  Medication Sig Dispense Refill   aspirin 81 MG tablet Take 81 mg by mouth daily.     atorvastatin (LIPITOR) 40 MG tablet TAKE 1 TABLET BY MOUTH EVERY  DAY 90 tablet 3   Blood Glucose Monitoring Suppl (BAYER CONTOUR MONITOR) w/Device KIT by Does not apply route. Use to check sugar daily and as needed Dx: E11.65     fenofibrate 160 MG tablet TAKE 1 TABLET (160 MG TOTAL) BY MOUTH DAILY. 90 tablet 3   glucose blood test strip 1 each by Other route as needed for other. Use as instructed to check sugar once daily and as needed. E11.65 **Contour**     Lancets MISC by Does not apply route. Use as directed to check sugar once daily and as needed. Dx. E11.65 **Contour**     lisinopril (PRINIVIL,ZESTRIL) 10 MG tablet TAKE 1 TABLET BY MOUTH EVERY  DAY 90 tablet 3   metFORMIN (GLUCOPHAGE) 500 MG tablet TAKE 1 TABLET BY MOUTH EVERY DAY 90 tablet 2   Multiple Vitamin (MULTIVITAMIN) tablet Take 1 tablet by mouth daily.     OMEGA-3 FATTY ACIDS PO Take 1 capsule by mouth daily.     omeprazole (PRILOSEC OTC) 20 MG tablet Take 20 mg by mouth daily.     sildenafil (VIAGRA) 100 MG tablet Take 100 mg by mouth daily as needed for erectile dysfunction.     tamsulosin (FLOMAX) 0.4 MG CAPS capsule Take 1 capsule (0.4 mg total) by mouth daily after supper. 30 capsule 0   No current facility-administered medications for this visit.     OBJECTIVE: Vitals:   06/14/18 1136  BP: 139/88  Pulse: 78  Temp: 98.3 F (36.8 C)     Body mass index is 32.5 kg/m.    ECOG FS:0 - Asymptomatic  General: Well-developed, well-nourished, no acute distress. Eyes: Pink conjunctiva, anicteric sclera. HEENT: Normocephalic, moist mucous membranes, enlarged right tonsil.  No palpable lymphadenopathy. Lungs: Clear to auscultation bilaterally. Heart: Regular rate and rhythm. No rubs, murmurs, or gallops. Abdomen: Soft, nontender, nondistended. No organomegaly noted, normoactive bowel sounds. Musculoskeletal: No edema, cyanosis, or clubbing. Neuro: Alert, answering all questions appropriately. Cranial nerves grossly intact. Skin: No rashes or petechiae noted. Psych: Normal affect. Lymphatics: No cervical, calvicular, axillary or inguinal LAD.   LAB RESULTS:  Lab Results  Component Value Date   NA 141 04/21/2018   K 4.6 04/21/2018   CL 104 04/21/2018   CO2 20 04/21/2018   GLUCOSE 174 (H) 04/21/2018   BUN 23 04/21/2018   CREATININE 1.34 (H) 04/21/2018   CALCIUM 10.2 04/21/2018   PROT 6.5 05/27/2017   ALBUMIN 4.3 05/27/2017   AST 19 05/27/2017   ALT 18 05/27/2017   ALKPHOS 47 05/27/2017   BILITOT 0.8 05/27/2017   GFRNONAA 56 (L) 04/21/2018   GFRAA 65 04/21/2018    Lab Results  Component Value Date   WBC 8.1 04/21/2018   NEUTROABS 8.4 (H)  01/26/2014   HGB 14.7 04/21/2018   HCT 44.1 04/21/2018   MCV 94 04/21/2018   PLT 232 04/21/2018     STUDIES: Ct Soft Tissue Neck W Contrast  Result Date: 05/23/2018 CLINICAL DATA:  Sore throat and dysphagia over the last 2 months. Right ear discomfort. Smoking history. EXAM: CT NECK WITH CONTRAST TECHNIQUE: Multidetector CT imaging of the neck was performed using the standard protocol following the bolus administration of intravenous contrast. CONTRAST:  12m OMNIPAQUE IOHEXOL 300 MG/ML  SOLN COMPARISON:  None. FINDINGS: Pharynx and larynx: There is a 2 x 2.5 cm right tonsillar mass. No other mucosal or submucosal lesion. Salivary glands: Parotid and submandibular glands are normal. Thyroid: Normal Lymph nodes: 3 abnormal lymph nodes on the right, level  2 to level 3. The largest node shows central low density and measures up to 14 mm short axis diameter. All 3 of these nodes are suspicious for metastatic disease. No enlarged node on the left side of the neck. Vascular: Atherosclerotic calcification at the carotid bifurcation regions. Limited intracranial: Normal Visualized orbits: Normal Mastoids and visualized paranasal sinuses: Clear Skeleton: Ordinary cervical spondylosis. Upper chest: Emphysema.  No infiltrate or mass. Other: Aortic atherosclerosis. IMPRESSION: 2.5 cm right tonsillar mass consistent with carcinoma. 3 abnormal lymph nodes on the right, level 2 to level 3 consistent with metastatic disease. Largest node measures 14 mm short axis dimension with central low density. Emphysema. Aortic atherosclerosis. Carotid bifurcation atherosclerosis. These results will be called to the ordering clinician or representative by the Radiologist Assistant, and communication documented in the PACS or zVision Dashboard. Electronically Signed   By: Nelson Chimes M.D.   On: 05/23/2018 11:38   Nm Pet Image Initial (pi) Skull Base To Thigh  Result Date: 06/09/2018 CLINICAL DATA:  Initial treatment strategy for  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 EXAM: NUCLEAR MEDICINE PET SKULL BASE TO THIGH TECHNIQUE: 13.4 mCi F-18 FDG was injected intravenously. Full-ring PET imaging was performed from the skull base to thigh after the radiotracer. CT data was obtained and used for attenuation correction and anatomic localization. Fasting blood glucose: 125 mg/dl COMPARISON:  Neck CT 05/23/2018.  Abdomen and pelvis CT 04/21/2018 FINDINGS: Mediastinal blood pool activity: SUV max 3.1 NECK: Asymmetric marked hypermetabolism identified in the right tonsillar region with SUV max = 41.3. Hypermetabolic right cervical level II lymph nodes demonstrate SUV max = 7.5. No evidence for hypermetabolic left cervical lymphadenopathy. Incidental CT findings: none CHEST: No hypermetabolic mediastinal or hilar nodes. No suspicious pulmonary nodules on the CT scan. Incidental CT findings: Coronary artery calcification is evident. Atherosclerotic calcification is noted in the wall of the thoracic aorta. Centrilobular emphsyema noted. ABDOMEN/PELVIS: No abnormal hypermetabolic activity within the liver, pancreas, adrenal glands, or spleen. No hypermetabolic lymph nodes in the abdomen or pelvis. Incidental CT findings: Diffuse low attenuation of the liver parenchyma is compatible with fatty deposition. Gallbladder is surgically absent. 7 mm nonobstructing stone noted upper pole right kidney. SKELETON: No focal hypermetabolic activity to suggest skeletal metastasis. Incidental CT findings: Focus of activity in the right antecubital fossa is compatible with the injection site. IMPRESSION: 1. Marked hypermetabolism in the right tonsillar region compatible with patient's known malignancy. Hypermetabolic metastatic lymphadenopathy noted right level II cervical lymph nodes. No hypermetabolic metastases in the contralateral neck. 2. No unexpected or suspicious hypermetabolic disease in the chest, abdomen, or pelvis. 3. Hepatic steatosis. 4.   Aortic Atherosclerois (ICD10-170.0) Electronically Signed   By: Misty Stanley M.D.   On: 06/09/2018 12:27   [place] Core Biopsy (lymph Nodes)  Result Date: 05/31/2018 INDICATION: Throat mass with right cervical adenopathy EXAM: ULTRASOUND GUIDED CORE BIOPSY OF RIGHT CERVICAL LYMPH NODE MEDICATIONS: None. ANESTHESIA/SEDATION: None PROCEDURE: The procedure, risks, benefits, and alternatives were explained to the patient. Questions regarding the procedure were encouraged and answered. The patient understands and consents to the procedure. The operative field was prepped with chlorhexidine in a sterile fashion, and a sterile drape was applied covering the operative field. A sterile gown and sterile gloves were used for the procedure. Local anesthesia was provided with 1% Lidocaine. Patient was brought the ultrasound suite and prepped and draped the usual sterile manner. Utilizing 0.25% Marcaine as local anesthetic and real-time ultrasound guidance, a 17 gauge guiding needle  was placed adjacent to the cervical adenopathy. Through this guiding needle, multiple 18 gauge biopsy cores were obtained. These were submitted for pathologic evaluation on wet Telfa pad. Guiding needle was then removed and hemostasis obtained at the puncture site. Puncture site was prepped in the standard sterile manner. Patient tolerated the procedure well and was returned his room in satisfactory condition. COMPLICATIONS: None immediate. FINDINGS: Cervical adenopathy is noted similar to that seen on prior CT examination. IMPRESSION: Successful right cervical lymph node biopsy under ultrasound guidance. Electronically Signed   By: Inez Catalina M.D.   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  Patient was also discussed recently at  tumor board.  He will benefit from concurrent chemotherapy using weekly cisplatin along with daily XRT.  Patient has an appointment with radiation oncology later this week.  He does not require port placement at this time, but will consider one later if his IV access becomes difficult.  Return to clinic in approximately 2 weeks to initiate cycle 1 of weekly 40 mg/m cisplatin along with his daily XRT.  I spent a total of 60 minutes face-to-face with the patient of which greater than 50% of the visit was spent in counseling and coordination of care as detailed above.  Patient expressed understanding and was in agreement with this plan. He also understands that He can call clinic at any time with any questions, concerns, or complaints.   Cancer Staging Squamous cell carcinoma of right tonsil ([place]) Staging form: Pharynx - HPV-Mediated Oropharynx, AJCC 8th Edition - Clinical stage from 06/12/2018: Stage I (cT2, cN1, cM0, p16+) - Signed by Lloyd Huger, MD on 06/12/2018   Lloyd Huger, MD   06/14/2018 1:38 PM

## 2018-06-13 ENCOUNTER — Other Ambulatory Visit: Payer: Self-pay

## 2018-06-14 ENCOUNTER — Inpatient Hospital Stay (HOSPITAL_BASED_OUTPATIENT_CLINIC_OR_DEPARTMENT_OTHER): Payer: BC Managed Care – PPO | Admitting: Oncology

## 2018-06-14 ENCOUNTER — Ambulatory Visit
Admission: RE | Admit: 2018-06-14 | Discharge: 2018-06-14 | Disposition: A | Discharge status: Home / Self Care | Payer: BC Managed Care – PPO | Source: Ambulatory Visit | Attending: Radiation Oncology | Admitting: Radiation Oncology

## 2018-06-14 ENCOUNTER — Telehealth: Payer: Self-pay

## 2018-06-14 ENCOUNTER — Other Ambulatory Visit: Payer: Self-pay

## 2018-06-14 VITALS — BP 139/88 | HR 78 | Temp 98.3°F | Ht 71.0 in | Wt 233.0 lb

## 2018-06-14 DIAGNOSIS — C099 Malignant neoplasm of tonsil, unspecified: Secondary | ICD-10-CM | POA: Insufficient documentation

## 2018-06-14 DIAGNOSIS — Z7984 Long term (current) use of oral hypoglycemic drugs: Secondary | ICD-10-CM | POA: Insufficient documentation

## 2018-06-14 DIAGNOSIS — N529 Male erectile dysfunction, unspecified: Secondary | ICD-10-CM | POA: Diagnosis not present

## 2018-06-14 DIAGNOSIS — E785 Hyperlipidemia, unspecified: Secondary | ICD-10-CM

## 2018-06-14 DIAGNOSIS — E669 Obesity, unspecified: Secondary | ICD-10-CM

## 2018-06-14 DIAGNOSIS — I10 Essential (primary) hypertension: Secondary | ICD-10-CM

## 2018-06-14 DIAGNOSIS — Z87891 Personal history of nicotine dependence: Secondary | ICD-10-CM | POA: Insufficient documentation

## 2018-06-14 DIAGNOSIS — E119 Type 2 diabetes mellitus without complications: Secondary | ICD-10-CM

## 2018-06-14 DIAGNOSIS — K219 Gastro-esophageal reflux disease without esophagitis: Secondary | ICD-10-CM | POA: Diagnosis not present

## 2018-06-14 DIAGNOSIS — J439 Emphysema, unspecified: Secondary | ICD-10-CM

## 2018-06-14 DIAGNOSIS — Z809 Family history of malignant neoplasm, unspecified: Secondary | ICD-10-CM | POA: Diagnosis not present

## 2018-06-14 DIAGNOSIS — Z87442 Personal history of urinary calculi: Secondary | ICD-10-CM | POA: Diagnosis not present

## 2018-06-14 DIAGNOSIS — Z7982 Long term (current) use of aspirin: Secondary | ICD-10-CM | POA: Insufficient documentation

## 2018-06-14 DIAGNOSIS — Z7189 Other specified counseling: Secondary | ICD-10-CM

## 2018-06-14 DIAGNOSIS — Z79899 Other long term (current) drug therapy: Secondary | ICD-10-CM

## 2018-06-14 DIAGNOSIS — Z5111 Encounter for antineoplastic chemotherapy: Secondary | ICD-10-CM | POA: Insufficient documentation

## 2018-06-14 DIAGNOSIS — R590 Localized enlarged lymph nodes: Secondary | ICD-10-CM | POA: Insufficient documentation

## 2018-06-14 MED ORDER — ONDANSETRON HCL 8 MG PO TABS: 8.0000 mg | ORAL_TABLET | Freq: Two times a day (BID) | ORAL | 1 refills | Status: DC | PRN

## 2018-06-14 MED ORDER — PROCHLORPERAZINE MALEATE 10 MG PO TABS: 10.0000 mg | ORAL_TABLET | Freq: Four times a day (QID) | ORAL | 1 refills | Status: DC | PRN

## 2018-06-14 NOTE — Consult Note (Signed)
NEW PATIENT EVALUATION  Name: William Johns  MRN: [id number]  Date:   06/14/2018     DOB: 1955-04-19   This 63 y.o. male patient presents to the clinic for initial evaluation of stage II (T2 N1 M0) squamous cell carcinoma of the right tonsil.  P 16+  REFERRING PHYSICIAN: Lloyd Huger, MD  CHIEF COMPLAINT:  Chief Complaint  Patient presents with  . Cancer    Initial consultation    DIAGNOSIS: The encounter diagnosis was Malignant neoplasm of tonsil ([place]).   PREVIOUS INVESTIGATIONS:  PET/CT and CT scans reviewed Clinical notes reviewed Pathology report reviewed Case presented at weekly tumor conference  HPI: Patient is a 63 year old male who has over the past 6 months experience persistent sore throat.  This eventually led to examination by ENT found to have a right tonsillar mass.  He underwent CT scan as well as PET CT scan.  PET scan demonstrated hypermetabolic activity in the right tonsillar region compatible with malignancy.  He also had lymphadenopathy in a and hypermetabolic activity in the level 2 right cervical node.  No hypermetabolic activity in the contralateral neck.  He underwent ultrasound-guided biopsy of right cervical node which was positive for metastatic nonkeratinizing squamous cell carcinoma HPV related P 16+.  He was presented our weekly tumor conference with recommendation for concurrent chemoradiation.  He has seen medical oncology who has recommended chemotherapy he is seen today by radiation oncology.  He is doing well he specifically denies dysphasia.  Continues to have sore throat.  He is lost several pounds although not significant weight loss.  PLANNED TREATMENT REGIMEN: Concurrent chemoradiation  PAST MEDICAL HISTORY:  has a past medical history of ED (erectile dysfunction), GERD (gastroesophageal reflux disease), History of kidney stones (2013), HLD (hyperlipidemia), HTN (hypertension), Obesity, T2DM (type 2 diabetes mellitus) ([place]), and Tonsillar mass  (05/23/2018).    PAST SURGICAL HISTORY:  Past Surgical History:  Procedure Laterality Date  . CHOLECYSTECTOMY  2007  . COLONOSCOPY  11/2008   2 hyperplastic polyps [name])  . EXTRACORPOREAL SHOCK WAVE LITHOTRIPSY Left 04/28/2018   Procedure: EXTRACORPOREAL SHOCK WAVE LITHOTRIPSY (ESWL);  Surgeon: Billey Co, MD;  Location: [facility] ORS;  Service: Urology;  Laterality: Left;  . LITHOTRIPSY  2013   x 2    FAMILY HISTORY: family history includes Aneurysm (age of onset: 23) in his mother; Cancer (age of onset: 53) in his father.  SOCIAL HISTORY:  reports that he quit smoking about 11 years ago. He has never used smokeless tobacco. He reports current alcohol use. He reports current drug use.  ALLERGIES: Oxycodone  MEDICATIONS:  Current Outpatient Medications  Medication Sig Dispense Refill  . aspirin 81 MG tablet Take 81 mg by mouth daily.    Marland Kitchen atorvastatin (LIPITOR) 40 MG tablet TAKE 1 TABLET BY MOUTH EVERY DAY 90 tablet 3  . Blood Glucose Monitoring Suppl (BAYER CONTOUR MONITOR) w/Device KIT by Does not apply route. Use to check sugar daily and as needed Dx: E11.65    . fenofibrate 160 MG tablet TAKE 1 TABLET (160 MG TOTAL) BY MOUTH DAILY. 90 tablet 3  . glucose blood test strip 1 each by Other route as needed for other. Use as instructed to check sugar once daily and as needed. E11.65 **Contour**    . Lancets MISC by Does not apply route. Use as directed to check sugar once daily and as needed. Dx. E11.65 **Contour**    . lisinopril (PRINIVIL,ZESTRIL) 10 MG tablet TAKE 1 TABLET BY MOUTH EVERY  DAY 90 tablet 3  . metFORMIN (GLUCOPHAGE) 500 MG tablet TAKE 1 TABLET BY MOUTH EVERY DAY 90 tablet 2  . Multiple Vitamin (MULTIVITAMIN) tablet Take 1 tablet by mouth daily.    . OMEGA-3 FATTY ACIDS PO Take 1 capsule by mouth daily.    Marland Kitchen omeprazole (PRILOSEC OTC) 20 MG tablet Take 20 mg by mouth daily.    . ondansetron (ZOFRAN) 8 MG tablet Take 1 tablet (8 mg total) by mouth 2 (two) times daily  as needed. Start on the third day after chemotherapy. 30 tablet 1  . prochlorperazine (COMPAZINE) 10 MG tablet Take 1 tablet (10 mg total) by mouth every 6 (six) hours as needed (Nausea or vomiting). 60 tablet 1  . sildenafil (VIAGRA) 100 MG tablet Take 100 mg by mouth daily as needed for erectile dysfunction.    . tamsulosin (FLOMAX) 0.4 MG CAPS capsule Take 1 capsule (0.4 mg total) by mouth daily after supper. 30 capsule 0   No current facility-administered medications for this encounter.     ECOG PERFORMANCE STATUS:  1 - Symptomatic but completely ambulatory  REVIEW OF SYSTEMS: Patient denies any weight loss, fatigue, weakness, fever, chills or night sweats. Patient denies any loss of vision, blurred vision. Patient denies any ringing  of the ears or hearing loss. No irregular heartbeat. Patient denies heart murmur or history of fainting. Patient denies any chest pain or pain radiating to her upper extremities. Patient denies any shortness of breath, difficulty breathing at night, cough or hemoptysis. Patient denies any swelling in the lower legs. Patient denies any nausea vomiting, vomiting of blood, or coffee ground material in the vomitus. Patient denies any stomach pain. Patient states has had normal bowel movements no significant constipation or diarrhea. Patient denies any dysuria, hematuria or significant nocturia. Patient denies any problems walking, swelling in the joints or loss of balance. Patient denies any skin changes, loss of hair or loss of weight. Patient denies any excessive worrying or anxiety or significant depression. Patient denies any problems with insomnia. Patient denies excessive thirst, polyuria, polydipsia. Patient denies any swollen glands, patient denies easy bruising or easy bleeding. Patient denies any recent infections, allergies or URI. Patient "s visual fields have not changed significantly in recent time.   PHYSICAL EXAM: There were no vitals taken for this  visit. Oral cavity shows enlargement the right tonsillar pillar compatible with no malignancy.  Really cannot discern any adenopathy in the right neck left neck is clear no supraclavicular adenopathy is appreciated.  Well-developed well-nourished patient in NAD. HEENT reveals PERLA, EOMI, discs not visualized.  Oral cavity is clear. No oral mucosal lesions are identified. Neck is clear without evidence of cervical or supraclavicular adenopathy. Lungs are clear to A&P. Cardiac examination is essentially unremarkable with regular rate and rhythm without murmur rub or thrill. Abdomen is benign with no organomegaly or masses noted. Motor sensory and DTR levels are equal and symmetric in the upper and lower extremities. Cranial nerves II through XII are grossly intact. Proprioception is intact. No peripheral adenopathy or edema is identified. No motor or sensory levels are noted. Crude visual fields are within normal range.  LABORATORY DATA: Pathology reports reviewed    RADIOLOGY RESULTS: CT scan and PET CT scan reviewed   IMPRESSION: Stage II HPV related squamous cell carcinoma the right tonsillar pillar and 63 year old male for concurrent chemoradiation.  PLAN: At this time like to go ahead with concurrent chemoradiation.  Would use IMRT treatment planning and delivery.  We  treat the hypermetabolic area since it is rather large to 7000 cGy over 7 weeks.  I think I can spare the nodes in the left neck and treat right neck nodes only.  I would treat the hypermetabolic node also to 8588 cGy reining nodes would receive approximately 5400 cGy using IMRT dose painting technique.  Risks and benefits of treatment including increased dysphasia sore throat fatigue skin reaction alteration of taste alteration of blood count or were discussed in detail with the patient.  He seems to comprehend my treatment plan well.  I have set up and ordered CT simulation for later this week.  We will use PET fusion study for his  treatment planning.  There will be extra effort by both professional staff as well as technical staff to coordinate and manage concurrent chemoradiation and ensuing side effects during his treatments.   I would like to take this opportunity to thank you for allowing me to participate in the care of your patient.Noreene Filbert, MD

## 2018-06-14 NOTE — Patient Instructions (Signed)
Cisplatin injection  What is this medicine?  CISPLATIN (SIS pla tin) is a chemotherapy drug. It targets fast dividing cells, like cancer cells, and causes these cells to die. This medicine is used to treat many types of cancer like bladder, ovarian, and testicular cancers.  This medicine may be used for other purposes; ask your health care provider or pharmacist if you have questions.  COMMON BRAND NAME(S): Platinol, Platinol -AQ  What should I tell my health care provider before I take this medicine?  They need to know if you have any of these conditions:  -blood disorders  -hearing problems  -kidney disease  -recent or ongoing radiation therapy  -an unusual or allergic reaction to cisplatin, carboplatin, other chemotherapy, other medicines, foods, dyes, or preservatives  -pregnant or trying to get pregnant  -breast-feeding  How should I use this medicine?  This drug is given as an infusion into a vein. It is administered in a hospital or clinic by a specially trained health care professional.  Talk to your pediatrician regarding the use of this medicine in children. Special care may be needed.  Overdosage: If you think you have taken too much of this medicine contact a poison control center or emergency room at once.  NOTE: This medicine is only for you. Do not share this medicine with others.  What if I miss a dose?  It is important not to miss a dose. Call your doctor or health care professional if you are unable to keep an appointment.  What may interact with this medicine?  -dofetilide  -foscarnet  -medicines for seizures  -medicines to increase blood counts like filgrastim, pegfilgrastim, sargramostim  -probenecid  -pyridoxine used with altretamine  -rituximab  -some antibiotics like amikacin, gentamicin, neomycin, polymyxin B, streptomycin, tobramycin  -sulfinpyrazone  -vaccines  -zalcitabine  Talk to your doctor or health care professional before taking any of these  medicines:  -acetaminophen  -aspirin  -ibuprofen  -ketoprofen  -naproxen  This list may not describe all possible interactions. Give your health care provider a list of all the medicines, herbs, non-prescription drugs, or dietary supplements you use. Also tell them if you smoke, drink alcohol, or use illegal drugs. Some items may interact with your medicine.  What should I watch for while using this medicine?  Your condition will be monitored carefully while you are receiving this medicine. You will need important blood work done while you are taking this medicine.  This drug may make you feel generally unwell. This is not uncommon, as chemotherapy can affect healthy cells as well as cancer cells. Report any side effects. Continue your course of treatment even though you feel ill unless your doctor tells you to stop.  In some cases, you may be given additional medicines to help with side effects. Follow all directions for their use.  Call your doctor or health care professional for advice if you get a fever, chills or sore throat, or other symptoms of a cold or flu. Do not treat yourself. This drug decreases your body's ability to fight infections. Try to avoid being around people who are sick.  This medicine may increase your risk to bruise or bleed. Call your doctor or health care professional if you notice any unusual bleeding.  Be careful brushing and flossing your teeth or using a toothpick because you may get an infection or bleed more easily. If you have any dental work done, tell your dentist you are receiving this medicine.  Avoid taking products   that contain aspirin, acetaminophen, ibuprofen, naproxen, or ketoprofen unless instructed by your doctor. These medicines may hide a fever.  Do not become pregnant while taking this medicine. Women should inform their doctor if they wish to become pregnant or think they might be pregnant. There is a potential for serious side effects to an unborn child. Talk to  your health care professional or pharmacist for more information. Do not breast-feed an infant while taking this medicine.  Drink fluids as directed while you are taking this medicine. This will help protect your kidneys.  Call your doctor or health care professional if you get diarrhea. Do not treat yourself.  What side effects may I notice from receiving this medicine?  Side effects that you should report to your doctor or health care professional as soon as possible:  -allergic reactions like skin rash, itching or hives, swelling of the face, lips, or tongue  -signs of infection - fever or chills, cough, sore throat, pain or difficulty passing urine  -signs of decreased platelets or bleeding - bruising, pinpoint red spots on the skin, black, tarry stools, nosebleeds  -signs of decreased red blood cells - unusually weak or tired, fainting spells, lightheadedness  -breathing problems  -changes in hearing  -gout pain  -low blood counts - This drug may decrease the number of white blood cells, red blood cells and platelets. You may be at increased risk for infections and bleeding.  -nausea and vomiting  -pain, swelling, redness or irritation at the injection site  -pain, tingling, numbness in the hands or feet  -problems with balance, movement  -trouble passing urine or change in the amount of urine  Side effects that usually do not require medical attention (report to your doctor or health care professional if they continue or are bothersome):  -changes in vision  -loss of appetite  -metallic taste in the mouth or changes in taste  This list may not describe all possible side effects. Call your doctor for medical advice about side effects. You may report side effects to FDA at 1-800-FDA-1088.  Where should I keep my medicine?  This drug is given in a hospital or clinic and will not be stored at home.  NOTE: This sheet is a summary. It may not cover all possible information. If you have questions about this medicine,  talk to your doctor, pharmacist, or health care provider.   2019 Elsevier/Gold Standard (2007-05-03 14:40:54)

## 2018-06-14 NOTE — Telephone Encounter (Signed)
Telephone call to patient to discuss chemotherapy education class.  Patient agree to education about chemotherapy via Web [employer].  All educational information sent per e-mail.  Will connect with patient on May 11th at 9:00 am.

## 2018-06-14 NOTE — Progress Notes (Signed)
START ON PATHWAY REGIMEN - Head and Neck     A cycle is every 7 days:     Cisplatin   **Always confirm dose/schedule in your pharmacy ordering system**  Patient Characteristics: Oropharynx, HPV Positive, Clinically Staged, T0-4, cN1-3 or T3-4, cN0 Disease Classification: Oropharynx HPV Status: Positive (+) AJCC N Category: cN1 AJCC 8 Stage Grouping: I Current Disease Status: No Distant Metastases and No Recurrent Disease AJCC T Category: T2 AJCC M Category: M0 Intent of Therapy: Curative Intent, Discussed with Patient

## 2018-06-14 NOTE — Progress Notes (Signed)
Patient is here to establish care for his malignant neoplasm of lymph nodes. Patient stated he has some pain at times when he swallows but he does not have dysphagia.

## 2018-06-15 ENCOUNTER — Other Ambulatory Visit: Payer: Self-pay

## 2018-06-16 ENCOUNTER — Ambulatory Visit
Admission: RE | Admit: 2018-06-16 | Discharge: 2018-06-16 | Disposition: A | Discharge status: Home / Self Care | Payer: BC Managed Care – PPO | Source: Ambulatory Visit | Attending: Radiation Oncology | Admitting: Radiation Oncology

## 2018-06-16 ENCOUNTER — Other Ambulatory Visit: Payer: Self-pay

## 2018-06-16 ENCOUNTER — Other Ambulatory Visit: Payer: Self-pay | Admitting: *Deleted

## 2018-06-16 DIAGNOSIS — J439 Emphysema, unspecified: Secondary | ICD-10-CM | POA: Diagnosis not present

## 2018-06-16 DIAGNOSIS — Z87891 Personal history of nicotine dependence: Secondary | ICD-10-CM | POA: Diagnosis not present

## 2018-06-16 DIAGNOSIS — Z7982 Long term (current) use of aspirin: Secondary | ICD-10-CM | POA: Diagnosis not present

## 2018-06-16 DIAGNOSIS — E785 Hyperlipidemia, unspecified: Secondary | ICD-10-CM | POA: Diagnosis not present

## 2018-06-16 DIAGNOSIS — I10 Essential (primary) hypertension: Secondary | ICD-10-CM | POA: Diagnosis not present

## 2018-06-16 DIAGNOSIS — E669 Obesity, unspecified: Secondary | ICD-10-CM | POA: Diagnosis not present

## 2018-06-16 DIAGNOSIS — N529 Male erectile dysfunction, unspecified: Secondary | ICD-10-CM | POA: Diagnosis not present

## 2018-06-16 DIAGNOSIS — K219 Gastro-esophageal reflux disease without esophagitis: Secondary | ICD-10-CM | POA: Diagnosis not present

## 2018-06-16 DIAGNOSIS — Z7984 Long term (current) use of oral hypoglycemic drugs: Secondary | ICD-10-CM | POA: Diagnosis not present

## 2018-06-16 DIAGNOSIS — E119 Type 2 diabetes mellitus without complications: Secondary | ICD-10-CM | POA: Diagnosis not present

## 2018-06-16 DIAGNOSIS — C099 Malignant neoplasm of tonsil, unspecified: Secondary | ICD-10-CM | POA: Diagnosis not present

## 2018-06-16 DIAGNOSIS — Z79899 Other long term (current) drug therapy: Secondary | ICD-10-CM | POA: Diagnosis not present

## 2018-06-16 DIAGNOSIS — Z87442 Personal history of urinary calculi: Secondary | ICD-10-CM | POA: Diagnosis not present

## 2018-06-16 DIAGNOSIS — R590 Localized enlarged lymph nodes: Secondary | ICD-10-CM | POA: Diagnosis not present

## 2018-06-16 DIAGNOSIS — Z5111 Encounter for antineoplastic chemotherapy: Secondary | ICD-10-CM | POA: Diagnosis not present

## 2018-06-16 DIAGNOSIS — Z809 Family history of malignant neoplasm, unspecified: Secondary | ICD-10-CM | POA: Diagnosis not present

## 2018-06-16 MED ORDER — SUCRALFATE 1 G PO TABS: 1.0000 g | ORAL_TABLET | Freq: Three times a day (TID) | ORAL | 3 refills | Status: DC

## 2018-06-17 ENCOUNTER — Telehealth: Payer: Self-pay | Admitting: Oncology

## 2018-06-20 ENCOUNTER — Inpatient Hospital Stay: Payer: BC Managed Care – PPO

## 2018-06-20 ENCOUNTER — Other Ambulatory Visit: Payer: Self-pay

## 2018-06-20 DIAGNOSIS — I10 Essential (primary) hypertension: Secondary | ICD-10-CM | POA: Diagnosis not present

## 2018-06-20 DIAGNOSIS — J439 Emphysema, unspecified: Secondary | ICD-10-CM | POA: Diagnosis not present

## 2018-06-20 DIAGNOSIS — E119 Type 2 diabetes mellitus without complications: Secondary | ICD-10-CM | POA: Diagnosis not present

## 2018-06-20 DIAGNOSIS — Z79899 Other long term (current) drug therapy: Secondary | ICD-10-CM | POA: Diagnosis not present

## 2018-06-20 DIAGNOSIS — Z87891 Personal history of nicotine dependence: Secondary | ICD-10-CM | POA: Diagnosis not present

## 2018-06-20 DIAGNOSIS — Z809 Family history of malignant neoplasm, unspecified: Secondary | ICD-10-CM | POA: Diagnosis not present

## 2018-06-20 DIAGNOSIS — Z87442 Personal history of urinary calculi: Secondary | ICD-10-CM | POA: Diagnosis not present

## 2018-06-20 DIAGNOSIS — Z7982 Long term (current) use of aspirin: Secondary | ICD-10-CM | POA: Diagnosis not present

## 2018-06-20 DIAGNOSIS — N529 Male erectile dysfunction, unspecified: Secondary | ICD-10-CM | POA: Diagnosis not present

## 2018-06-20 DIAGNOSIS — Z7984 Long term (current) use of oral hypoglycemic drugs: Secondary | ICD-10-CM | POA: Diagnosis not present

## 2018-06-20 DIAGNOSIS — R590 Localized enlarged lymph nodes: Secondary | ICD-10-CM | POA: Diagnosis not present

## 2018-06-20 DIAGNOSIS — E669 Obesity, unspecified: Secondary | ICD-10-CM | POA: Diagnosis not present

## 2018-06-20 DIAGNOSIS — E785 Hyperlipidemia, unspecified: Secondary | ICD-10-CM | POA: Diagnosis not present

## 2018-06-20 DIAGNOSIS — Z5111 Encounter for antineoplastic chemotherapy: Secondary | ICD-10-CM | POA: Diagnosis not present

## 2018-06-20 DIAGNOSIS — C099 Malignant neoplasm of tonsil, unspecified: Secondary | ICD-10-CM | POA: Diagnosis not present

## 2018-06-20 DIAGNOSIS — K219 Gastro-esophageal reflux disease without esophagitis: Secondary | ICD-10-CM | POA: Diagnosis not present

## 2018-06-25 NOTE — Progress Notes (Signed)
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  INTERVAL HISTORY: Patient returns to clinic today for further evaluation and to initiate cycle 1 of weekly cisplatin.  He also initiated daily XRT today.  He currently feels well and is asymptomatic. He has no neurologic complaints.  He denies any recent fevers or illnesses.  He has a good appetite and denies weight loss.  He has no chest pain, shortness of breath, cough, or hemoptysis.  He has no nausea, vomiting, constipation, or diarrhea.  He has no urinary complaints.  Patient offers no specific complaints today.  REVIEW OF SYSTEMS:   Review of Systems  Constitutional: Negative.  Negative for fever, malaise/fatigue and weight loss.  HENT: Positive for sore throat.   Respiratory: Negative.  Negative for cough and shortness of breath.   Cardiovascular: Negative.  Negative for chest pain and leg swelling.  Gastrointestinal: Negative.  Negative for abdominal pain.  Genitourinary: Negative.  Negative for dysuria.  Musculoskeletal: Negative.  Negative for back pain.  Skin: Negative.  Negative for rash.  Neurological: Negative.  Negative for speech change, weakness and headaches.  Psychiatric/Behavioral: The patient is not nervous/anxious.     As per HPI. Otherwise, a complete review of systems is negative.  PAST MEDICAL HISTORY: Past Medical History:  Diagnosis Date   ED (erectile dysfunction)    GERD (gastroesophageal reflux disease)    History of kidney stones 2013   s/p lithotripsy   HLD (hyperlipidemia)    HTN (hypertension)    Obesity    T2DM (type 2 diabetes mellitus) ([place])    DSME at [facility] 01/2012   Tonsillar mass 05/23/2018    PAST SURGICAL HISTORY: Past Surgical History:    Procedure Laterality Date   CHOLECYSTECTOMY  2007   COLONOSCOPY  11/2008   2 hyperplastic polyps [name])   EXTRACORPOREAL SHOCK WAVE LITHOTRIPSY Left 04/28/2018   Procedure: EXTRACORPOREAL SHOCK WAVE LITHOTRIPSY (ESWL);  Surgeon: Billey Co, MD;  Location: [facility] ORS;  Service: Urology;  Laterality: Left;   LITHOTRIPSY  2013   x 2    FAMILY HISTORY: Family History  Problem Relation Age of Onset   Cancer Father 69       lung, smoker   Aneurysm Mother 10       brain   Coronary artery disease Neg Hx    Stroke Neg Hx    Diabetes Neg Hx     ADVANCED DIRECTIVES (Y/N):  N  HEALTH MAINTENANCE: Social History   Tobacco Use   Smoking status: Former Smoker    Last attempt to quit: 02/10/2007    Years since quitting: 11.3   Smokeless tobacco: Never Used  Substance Use Topics   Alcohol use: Yes    Alcohol/week: 0.0 standard drinks    Comment: 5 drinks/week    Drug use: Yes    Comment: rare MJ     Colonoscopy:  PAP:  Bone density:  Lipid panel:  Allergies  Allergen Reactions   Oxycodone Nausea And Vomiting    Current Outpatient Medications  Medication Sig Dispense Refill   aspirin 81 MG tablet Take 81 mg by mouth daily.     atorvastatin (LIPITOR) 40 MG tablet TAKE 1 TABLET BY MOUTH EVERY DAY 90 tablet 3   Blood Glucose Monitoring Suppl (BAYER CONTOUR  MONITOR) w/Device KIT by Does not apply route. Use to check sugar daily and as needed Dx: E11.65     fenofibrate 160 MG tablet TAKE 1 TABLET (160 MG TOTAL) BY MOUTH DAILY. 90 tablet 3   glucose blood test strip 1 each by Other route as needed for other. Use as instructed to check sugar once daily and as needed. E11.65 **Contour**     Lancets MISC by Does not apply route. Use as directed to check sugar once daily and as needed. Dx. E11.65 **Contour**     lisinopril (PRINIVIL,ZESTRIL) 10 MG tablet TAKE 1 TABLET BY MOUTH EVERY DAY 90 tablet 3   metFORMIN (GLUCOPHAGE) 500 MG tablet TAKE 1 TABLET BY MOUTH  EVERY DAY 90 tablet 2   Multiple Vitamin (MULTIVITAMIN) tablet Take 1 tablet by mouth daily.     OMEGA-3 FATTY ACIDS PO Take 1 capsule by mouth daily.     omeprazole (PRILOSEC OTC) 20 MG tablet Take 20 mg by mouth daily.     tamsulosin (FLOMAX) 0.4 MG CAPS capsule Take 1 capsule (0.4 mg total) by mouth daily after supper. 30 capsule 0   ondansetron (ZOFRAN) 8 MG tablet Take 1 tablet (8 mg total) by mouth 2 (two) times daily as needed. Start on the third day after chemotherapy. (Patient not taking: Reported on 06/28/2018) 30 tablet 1   prochlorperazine (COMPAZINE) 10 MG tablet Take 1 tablet (10 mg total) by mouth every 6 (six) hours as needed (Nausea or vomiting). (Patient not taking: Reported on 06/28/2018) 60 tablet 1   sildenafil (VIAGRA) 100 MG tablet Take 100 mg by mouth daily as needed for erectile dysfunction.     sucralfate (CARAFATE) 1 g tablet Take 1 tablet (1 g total) by mouth 3 (three) times daily. Dissolve in 3-4 tbsp warm water, swish and swallow. (Patient not taking: Reported on 06/28/2018) 90 tablet 3   No current facility-administered medications for this visit.     OBJECTIVE: Vitals:   06/28/18 0841  BP: (!) 150/92  Pulse: 80  Resp: 20  Temp: 97.8 F (36.6 C)     Body mass index is 32.5 kg/m.    ECOG FS:0 - Asymptomatic  General: Well-developed, well-nourished, no acute distress. Eyes: Pink conjunctiva, anicteric sclera. HEENT: Normocephalic, moist mucous membranes, enlarged right tonsil, no palpable lymphadenopathy. Lungs: Clear to auscultation bilaterally. Heart: Regular rate and rhythm. No rubs, murmurs, or gallops. Abdomen: Soft, nontender, nondistended. No organomegaly noted, normoactive bowel sounds. Musculoskeletal: No edema, cyanosis, or clubbing. Neuro: Alert, answering all questions appropriately. Cranial nerves grossly intact. Skin: No rashes or petechiae noted. Psych: Normal affect.  LAB RESULTS:  Lab Results  Component Value Date   NA 138  06/28/2018   K 4.0 06/28/2018   CL 107 06/28/2018   CO2 22 06/28/2018   GLUCOSE 205 (H) 06/28/2018   BUN 21 06/28/2018   CREATININE 1.23 06/28/2018   CALCIUM 9.3 06/28/2018   PROT 6.5 05/27/2017   ALBUMIN 4.3 05/27/2017   AST 19 05/27/2017   ALT 18 05/27/2017   ALKPHOS 47 05/27/2017   BILITOT 0.8 05/27/2017   GFRNONAA >60 06/28/2018   GFRAA >60 06/28/2018    Lab Results  Component Value Date   WBC 7.7 06/28/2018   NEUTROABS 5.1 06/28/2018   HGB 14.6 06/28/2018   HCT 42.3 06/28/2018   MCV 89.6 06/28/2018   PLT 188 06/28/2018     STUDIES: Nm Pet Image Initial (pi) Skull Base To Thigh  Result Date: 06/09/2018 CLINICAL DATA:  Initial treatment  strategy for 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 EXAM: NUCLEAR MEDICINE PET SKULL BASE TO THIGH TECHNIQUE: 13.4 mCi F-18 FDG was injected intravenously. Full-ring PET imaging was performed from the skull base to thigh after the radiotracer. CT data was obtained and used for attenuation correction and anatomic localization. Fasting blood glucose: 125 mg/dl COMPARISON:  Neck CT 05/23/2018.  Abdomen and pelvis CT 04/21/2018 FINDINGS: Mediastinal blood pool activity: SUV max 3.1 NECK: Asymmetric marked hypermetabolism identified in the right tonsillar region with SUV max = 41.3. Hypermetabolic right cervical level II lymph nodes demonstrate SUV max = 7.5. No evidence for hypermetabolic left cervical lymphadenopathy. Incidental CT findings: none CHEST: No hypermetabolic mediastinal or hilar nodes. No suspicious pulmonary nodules on the CT scan. Incidental CT findings: Coronary artery calcification is evident. Atherosclerotic calcification is noted in the wall of the thoracic aorta. Centrilobular emphsyema noted. ABDOMEN/PELVIS: No abnormal hypermetabolic activity within the liver, pancreas, adrenal glands, or spleen. No hypermetabolic lymph nodes in the abdomen or pelvis. Incidental CT findings: Diffuse low attenuation of the liver  parenchyma is compatible with fatty deposition. Gallbladder is surgically absent. 7 mm nonobstructing stone noted upper pole right kidney. SKELETON: No focal hypermetabolic activity to suggest skeletal metastasis. Incidental CT findings: Focus of activity in the right antecubital fossa is compatible with the injection site. IMPRESSION: 1. Marked hypermetabolism in the right tonsillar region compatible with patient's known malignancy. Hypermetabolic metastatic lymphadenopathy noted right level II cervical lymph nodes. No hypermetabolic metastases in the contralateral neck. 2. No unexpected or suspicious hypermetabolic disease in the chest, abdomen, or pelvis. 3. Hepatic steatosis. 4.  Aortic Atherosclerois (ICD10-170.0) Electronically Signed   By: Misty Stanley M.D.   On: 06/09/2018 12:27   [place] Core Biopsy (lymph Nodes)  Result Date: 05/31/2018 INDICATION: Throat mass with right cervical adenopathy EXAM: ULTRASOUND GUIDED CORE BIOPSY OF RIGHT CERVICAL LYMPH NODE MEDICATIONS: None. ANESTHESIA/SEDATION: None PROCEDURE: The procedure, risks, benefits, and alternatives were explained to the patient. Questions regarding the procedure were encouraged and answered. The patient understands and consents to the procedure. The operative field was prepped with chlorhexidine in a sterile fashion, and a sterile drape was applied covering the operative field. A sterile gown and sterile gloves were used for the procedure. Local anesthesia was provided with 1% Lidocaine. Patient was brought the ultrasound suite and prepped and draped the usual sterile manner. Utilizing 0.25% Marcaine as local anesthetic and real-time ultrasound guidance, a 17 gauge guiding needle was placed adjacent to the cervical adenopathy. Through this guiding needle, multiple 18 gauge biopsy cores were obtained. These were submitted for pathologic evaluation on wet Telfa pad. Guiding needle was then removed and hemostasis obtained at the puncture site.  Puncture site was prepped in the standard sterile manner. Patient tolerated the procedure well and was returned his room in satisfactory condition. COMPLICATIONS: None immediate. FINDINGS: Cervical adenopathy is noted similar to that seen on prior CT examination. IMPRESSION: Successful right cervical lymph node biopsy under ultrasound guidance. Electronically Signed   By: Inez Catalina M.D.   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  Patient was also discussed recently at tumor board.  He will benefit from concurrent chemotherapy using weekly cisplatin along with daily XRT. He does not require port placement at this time,  but will consider one later if his IV access becomes difficult. He initiated XRT this morning.  Proceed with cycle 1 of weekly cisplatin.  Return to clinic in 1 week for further evaluation and consideration of cycle 2.    I spent a total of 30 minutes face-to-face with the patient of which greater than 50% of the visit was spent in counseling and coordination of care as detailed above.  Patient expressed understanding and was in agreement with this plan. He also understands that He can call clinic at any time with any questions, concerns, or complaints.   Cancer Staging Squamous cell carcinoma of right tonsil ([place]) Staging form: Pharynx - HPV-Mediated Oropharynx, AJCC 8th Edition - Clinical stage from 06/12/2018: Stage I (cT2, cN1, cM0, p16+) - Signed by Lloyd Huger, MD on 06/12/2018   Lloyd Huger, MD   06/30/2018 6:44 AM

## 2018-06-27 ENCOUNTER — Other Ambulatory Visit: Payer: Self-pay

## 2018-06-27 ENCOUNTER — Ambulatory Visit
Admission: RE | Admit: 2018-06-27 | Discharge: 2018-06-27 | Disposition: A | Discharge status: Home / Self Care | Payer: BC Managed Care – PPO | Source: Ambulatory Visit | Attending: Radiation Oncology | Admitting: Radiation Oncology

## 2018-06-28 ENCOUNTER — Ambulatory Visit
Admission: RE | Admit: 2018-06-28 | Discharge: 2018-06-28 | Disposition: A | Discharge status: Home / Self Care | Payer: BC Managed Care – PPO | Source: Ambulatory Visit | Attending: Radiation Oncology | Admitting: Radiation Oncology

## 2018-06-28 ENCOUNTER — Encounter: Payer: Self-pay | Admitting: Oncology

## 2018-06-28 ENCOUNTER — Inpatient Hospital Stay: Payer: BC Managed Care – PPO

## 2018-06-28 ENCOUNTER — Inpatient Hospital Stay (HOSPITAL_BASED_OUTPATIENT_CLINIC_OR_DEPARTMENT_OTHER): Payer: BC Managed Care – PPO | Admitting: Oncology

## 2018-06-28 ENCOUNTER — Other Ambulatory Visit: Payer: Self-pay

## 2018-06-28 VITALS — BP 150/92 | HR 80 | Temp 97.8°F | Resp 20 | Ht 71.0 in | Wt 233.0 lb

## 2018-06-28 DIAGNOSIS — Z87891 Personal history of nicotine dependence: Secondary | ICD-10-CM | POA: Diagnosis not present

## 2018-06-28 DIAGNOSIS — C099 Malignant neoplasm of tonsil, unspecified: Secondary | ICD-10-CM | POA: Diagnosis not present

## 2018-06-28 DIAGNOSIS — E785 Hyperlipidemia, unspecified: Secondary | ICD-10-CM

## 2018-06-28 DIAGNOSIS — I10 Essential (primary) hypertension: Secondary | ICD-10-CM

## 2018-06-28 DIAGNOSIS — Z7984 Long term (current) use of oral hypoglycemic drugs: Secondary | ICD-10-CM

## 2018-06-28 DIAGNOSIS — E669 Obesity, unspecified: Secondary | ICD-10-CM

## 2018-06-28 DIAGNOSIS — J439 Emphysema, unspecified: Secondary | ICD-10-CM | POA: Diagnosis not present

## 2018-06-28 DIAGNOSIS — Z7982 Long term (current) use of aspirin: Secondary | ICD-10-CM

## 2018-06-28 DIAGNOSIS — Z79899 Other long term (current) drug therapy: Secondary | ICD-10-CM | POA: Diagnosis not present

## 2018-06-28 DIAGNOSIS — R590 Localized enlarged lymph nodes: Secondary | ICD-10-CM | POA: Diagnosis not present

## 2018-06-28 DIAGNOSIS — Z87442 Personal history of urinary calculi: Secondary | ICD-10-CM | POA: Diagnosis not present

## 2018-06-28 DIAGNOSIS — N529 Male erectile dysfunction, unspecified: Secondary | ICD-10-CM | POA: Diagnosis not present

## 2018-06-28 DIAGNOSIS — K219 Gastro-esophageal reflux disease without esophagitis: Secondary | ICD-10-CM | POA: Diagnosis not present

## 2018-06-28 DIAGNOSIS — Z5111 Encounter for antineoplastic chemotherapy: Secondary | ICD-10-CM | POA: Diagnosis not present

## 2018-06-28 DIAGNOSIS — E119 Type 2 diabetes mellitus without complications: Secondary | ICD-10-CM

## 2018-06-28 DIAGNOSIS — Z809 Family history of malignant neoplasm, unspecified: Secondary | ICD-10-CM | POA: Diagnosis not present

## 2018-06-28 LAB — BASIC METABOLIC PANEL
Anion gap: 9 (ref 5–15)
BUN: 21 mg/dL (ref 8–23)
CO2: 22 mmol/L (ref 22–32)
Calcium: 9.3 mg/dL (ref 8.9–10.3)
Chloride: 107 mmol/L (ref 98–111)
Creatinine, Ser: 1.23 mg/dL (ref 0.61–1.24)
GFR calc Af Amer: >60 mL/min (ref >60)
GFR calc non Af Amer: >60 mL/min (ref >60)
Glucose, Bld: 205 mg/dL — ABNORMAL HIGH (ref 70–99)
Potassium: 4 mmol/L (ref 3.5–5.1)
Sodium: 138 mmol/L (ref 135–145)

## 2018-06-28 LAB — CBC WITH DIFFERENTIAL/PLATELET
Abs Immature Granulocytes: 0.04 10*3/uL (ref 0.00–0.07)
Basophils Absolute: 0.1 10*3/uL (ref 0.0–0.1)
Basophils Relative: 1 %
Eosinophils Absolute: 0.3 10*3/uL (ref 0.0–0.5)
Eosinophils Relative: 4 %
HCT: 42.3 % (ref 39.0–52.0)
Hemoglobin: 14.6 g/dL (ref 13.0–17.0)
Immature Granulocytes: 1 %
Lymphocytes Relative: 22 %
Lymphs Abs: 1.7 10*3/uL (ref 0.7–4.0)
MCH: 30.9 pg (ref 26.0–34.0)
MCHC: 34.5 g/dL (ref 30.0–36.0)
MCV: 89.6 fL (ref 80.0–100.0)
Monocytes Absolute: 0.5 10*3/uL (ref 0.1–1.0)
Monocytes Relative: 6 %
Neutro Abs: 5.1 10*3/uL (ref 1.7–7.7)
Neutrophils Relative %: 66 %
Platelets: 188 10*3/uL (ref 150–400)
RBC: 4.72 MIL/uL (ref 4.22–5.81)
RDW: 12 % (ref 11.5–15.5)
WBC: 7.7 10*3/uL (ref 4.0–10.5)
nRBC: 0 % (ref 0.0–0.2)

## 2018-06-28 MED ORDER — SODIUM CHLORIDE 0.9 % IV SOLN
Freq: Once | INTRAVENOUS | Status: AC
  Administered 2018-06-28: 12:00:00 via INTRAVENOUS
  Filled 2018-06-28: qty 5

## 2018-06-28 MED ORDER — POTASSIUM CHLORIDE 2 MEQ/ML IV SOLN
Freq: Once | INTRAVENOUS | Status: AC
  Administered 2018-06-28: 10:00:00 via INTRAVENOUS
  Filled 2018-06-28: qty 1000

## 2018-06-28 MED ORDER — SODIUM CHLORIDE 0.9 % IV SOLN
40.0000 mg/m2 | Freq: Once | INTRAVENOUS | Status: AC
  Administered 2018-06-28: 92 mg via INTRAVENOUS
  Filled 2018-06-28: qty 92

## 2018-06-28 MED ORDER — SODIUM CHLORIDE 0.9 % IV SOLN
Freq: Once | INTRAVENOUS | Status: AC
  Administered 2018-06-28: 10:00:00 via INTRAVENOUS
  Filled 2018-06-28: qty 250

## 2018-06-28 MED ORDER — PALONOSETRON HCL INJECTION 0.25 MG/5ML
0.2500 mg | Freq: Once | INTRAVENOUS | Status: AC
  Administered 2018-06-28: 0.25 mg via INTRAVENOUS
  Filled 2018-06-28: qty 5

## 2018-06-28 NOTE — Progress Notes (Signed)
Pt tolerated infusion we.. Pt stable at discharge.

## 2018-06-29 ENCOUNTER — Ambulatory Visit
Admission: RE | Admit: 2018-06-29 | Discharge: 2018-06-29 | Disposition: A | Discharge status: Home / Self Care | Payer: BC Managed Care – PPO | Source: Ambulatory Visit | Attending: Radiation Oncology | Admitting: Radiation Oncology

## 2018-06-29 ENCOUNTER — Other Ambulatory Visit: Payer: Self-pay

## 2018-06-29 DIAGNOSIS — Z7984 Long term (current) use of oral hypoglycemic drugs: Secondary | ICD-10-CM | POA: Diagnosis not present

## 2018-06-29 DIAGNOSIS — Z87442 Personal history of urinary calculi: Secondary | ICD-10-CM | POA: Diagnosis not present

## 2018-06-29 DIAGNOSIS — Z7982 Long term (current) use of aspirin: Secondary | ICD-10-CM | POA: Diagnosis not present

## 2018-06-29 DIAGNOSIS — E669 Obesity, unspecified: Secondary | ICD-10-CM | POA: Diagnosis not present

## 2018-06-29 DIAGNOSIS — C099 Malignant neoplasm of tonsil, unspecified: Secondary | ICD-10-CM | POA: Diagnosis not present

## 2018-06-29 DIAGNOSIS — E785 Hyperlipidemia, unspecified: Secondary | ICD-10-CM | POA: Diagnosis not present

## 2018-06-29 DIAGNOSIS — K219 Gastro-esophageal reflux disease without esophagitis: Secondary | ICD-10-CM | POA: Diagnosis not present

## 2018-06-29 DIAGNOSIS — J439 Emphysema, unspecified: Secondary | ICD-10-CM | POA: Diagnosis not present

## 2018-06-29 DIAGNOSIS — Z87891 Personal history of nicotine dependence: Secondary | ICD-10-CM | POA: Diagnosis not present

## 2018-06-29 DIAGNOSIS — Z5111 Encounter for antineoplastic chemotherapy: Secondary | ICD-10-CM | POA: Diagnosis not present

## 2018-06-29 DIAGNOSIS — Z809 Family history of malignant neoplasm, unspecified: Secondary | ICD-10-CM | POA: Diagnosis not present

## 2018-06-29 DIAGNOSIS — Z79899 Other long term (current) drug therapy: Secondary | ICD-10-CM | POA: Diagnosis not present

## 2018-06-29 DIAGNOSIS — I10 Essential (primary) hypertension: Secondary | ICD-10-CM | POA: Diagnosis not present

## 2018-06-29 DIAGNOSIS — R590 Localized enlarged lymph nodes: Secondary | ICD-10-CM | POA: Diagnosis not present

## 2018-06-29 DIAGNOSIS — E119 Type 2 diabetes mellitus without complications: Secondary | ICD-10-CM | POA: Diagnosis not present

## 2018-06-29 DIAGNOSIS — N529 Male erectile dysfunction, unspecified: Secondary | ICD-10-CM | POA: Diagnosis not present

## 2018-06-30 ENCOUNTER — Other Ambulatory Visit: Payer: Self-pay

## 2018-06-30 ENCOUNTER — Ambulatory Visit
Admission: RE | Admit: 2018-06-30 | Discharge: 2018-06-30 | Disposition: A | Discharge status: Home / Self Care | Payer: BC Managed Care – PPO | Source: Ambulatory Visit | Attending: Radiation Oncology | Admitting: Radiation Oncology

## 2018-06-30 DIAGNOSIS — I10 Essential (primary) hypertension: Secondary | ICD-10-CM | POA: Diagnosis not present

## 2018-06-30 DIAGNOSIS — Z7984 Long term (current) use of oral hypoglycemic drugs: Secondary | ICD-10-CM | POA: Diagnosis not present

## 2018-06-30 DIAGNOSIS — N529 Male erectile dysfunction, unspecified: Secondary | ICD-10-CM | POA: Diagnosis not present

## 2018-06-30 DIAGNOSIS — J439 Emphysema, unspecified: Secondary | ICD-10-CM | POA: Diagnosis not present

## 2018-06-30 DIAGNOSIS — Z809 Family history of malignant neoplasm, unspecified: Secondary | ICD-10-CM | POA: Diagnosis not present

## 2018-06-30 DIAGNOSIS — Z5111 Encounter for antineoplastic chemotherapy: Secondary | ICD-10-CM | POA: Diagnosis not present

## 2018-06-30 DIAGNOSIS — E669 Obesity, unspecified: Secondary | ICD-10-CM | POA: Diagnosis not present

## 2018-06-30 DIAGNOSIS — Z87891 Personal history of nicotine dependence: Secondary | ICD-10-CM | POA: Diagnosis not present

## 2018-06-30 DIAGNOSIS — K219 Gastro-esophageal reflux disease without esophagitis: Secondary | ICD-10-CM | POA: Diagnosis not present

## 2018-06-30 DIAGNOSIS — Z79899 Other long term (current) drug therapy: Secondary | ICD-10-CM | POA: Diagnosis not present

## 2018-06-30 DIAGNOSIS — E785 Hyperlipidemia, unspecified: Secondary | ICD-10-CM | POA: Diagnosis not present

## 2018-06-30 DIAGNOSIS — R590 Localized enlarged lymph nodes: Secondary | ICD-10-CM | POA: Diagnosis not present

## 2018-06-30 DIAGNOSIS — E119 Type 2 diabetes mellitus without complications: Secondary | ICD-10-CM | POA: Diagnosis not present

## 2018-06-30 DIAGNOSIS — Z87442 Personal history of urinary calculi: Secondary | ICD-10-CM | POA: Diagnosis not present

## 2018-06-30 DIAGNOSIS — C099 Malignant neoplasm of tonsil, unspecified: Secondary | ICD-10-CM | POA: Diagnosis not present

## 2018-06-30 DIAGNOSIS — Z7982 Long term (current) use of aspirin: Secondary | ICD-10-CM | POA: Diagnosis not present

## 2018-07-01 ENCOUNTER — Ambulatory Visit
Admission: RE | Admit: 2018-07-01 | Discharge: 2018-07-01 | Disposition: A | Discharge status: Home / Self Care | Payer: BC Managed Care – PPO | Source: Ambulatory Visit | Attending: Radiation Oncology | Admitting: Radiation Oncology

## 2018-07-01 ENCOUNTER — Other Ambulatory Visit: Payer: Self-pay

## 2018-07-01 DIAGNOSIS — E785 Hyperlipidemia, unspecified: Secondary | ICD-10-CM | POA: Diagnosis not present

## 2018-07-01 DIAGNOSIS — Z7984 Long term (current) use of oral hypoglycemic drugs: Secondary | ICD-10-CM | POA: Diagnosis not present

## 2018-07-01 DIAGNOSIS — E669 Obesity, unspecified: Secondary | ICD-10-CM | POA: Diagnosis not present

## 2018-07-01 DIAGNOSIS — Z809 Family history of malignant neoplasm, unspecified: Secondary | ICD-10-CM | POA: Diagnosis not present

## 2018-07-01 DIAGNOSIS — Z5111 Encounter for antineoplastic chemotherapy: Secondary | ICD-10-CM | POA: Diagnosis not present

## 2018-07-01 DIAGNOSIS — C099 Malignant neoplasm of tonsil, unspecified: Secondary | ICD-10-CM | POA: Diagnosis not present

## 2018-07-01 DIAGNOSIS — Z79899 Other long term (current) drug therapy: Secondary | ICD-10-CM | POA: Diagnosis not present

## 2018-07-01 DIAGNOSIS — Z87442 Personal history of urinary calculi: Secondary | ICD-10-CM | POA: Diagnosis not present

## 2018-07-01 DIAGNOSIS — N529 Male erectile dysfunction, unspecified: Secondary | ICD-10-CM | POA: Diagnosis not present

## 2018-07-01 DIAGNOSIS — I10 Essential (primary) hypertension: Secondary | ICD-10-CM | POA: Diagnosis not present

## 2018-07-01 DIAGNOSIS — K219 Gastro-esophageal reflux disease without esophagitis: Secondary | ICD-10-CM | POA: Diagnosis not present

## 2018-07-01 DIAGNOSIS — E119 Type 2 diabetes mellitus without complications: Secondary | ICD-10-CM | POA: Diagnosis not present

## 2018-07-01 DIAGNOSIS — Z87891 Personal history of nicotine dependence: Secondary | ICD-10-CM | POA: Diagnosis not present

## 2018-07-01 DIAGNOSIS — J439 Emphysema, unspecified: Secondary | ICD-10-CM | POA: Diagnosis not present

## 2018-07-01 DIAGNOSIS — Z7982 Long term (current) use of aspirin: Secondary | ICD-10-CM | POA: Diagnosis not present

## 2018-07-01 DIAGNOSIS — R590 Localized enlarged lymph nodes: Secondary | ICD-10-CM | POA: Diagnosis not present

## 2018-07-04 NOTE — Progress Notes (Signed)
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  INTERVAL HISTORY: Patient returns to clinic today for further evaluation and consideration of cycle 2 of weekly cisplatin.  He tolerated his first infusion well without significant side effects.  He is also tolerating his daily XRT.  He currently feels well and is asymptomatic.  He denies any weakness or fatigue. He has no neurologic complaints.  He denies any recent fevers or illnesses.  He has a good appetite and denies weight loss.  He has no chest pain, shortness of breath, cough, or hemoptysis.  He has no nausea, vomiting, constipation, or diarrhea.  He has no urinary complaints.  Patient feels at his baseline offers no specific complaints today.  REVIEW OF SYSTEMS:   Review of Systems  Constitutional: Negative.  Negative for fever, malaise/fatigue and weight loss.  HENT: Negative for sore throat.   Respiratory: Negative.  Negative for cough and shortness of breath.   Cardiovascular: Negative.  Negative for chest pain and leg swelling.  Gastrointestinal: Negative.  Negative for abdominal pain.  Genitourinary: Negative.  Negative for dysuria.  Musculoskeletal: Negative.  Negative for back pain.  Skin: Negative.  Negative for rash.  Neurological: Negative.  Negative for speech change, weakness and headaches.  Psychiatric/Behavioral: Negative.  The patient is not nervous/anxious.     As per HPI. Otherwise, a complete review of systems is negative.  PAST MEDICAL HISTORY: Past Medical History:  Diagnosis Date  . ED (erectile dysfunction)   . GERD (gastroesophageal reflux disease)   . History of kidney stones 2013   s/p lithotripsy  . HLD (hyperlipidemia)   . HTN (hypertension)   . Obesity    . T2DM (type 2 diabetes mellitus) ([place])    DSME at [facility] 01/2012  . Tonsillar mass 05/23/2018    PAST SURGICAL HISTORY: Past Surgical History:  Procedure Laterality Date  . CHOLECYSTECTOMY  2007  . COLONOSCOPY  11/2008   2 hyperplastic polyps [name])  . EXTRACORPOREAL SHOCK WAVE LITHOTRIPSY Left 04/28/2018   Procedure: EXTRACORPOREAL SHOCK WAVE LITHOTRIPSY (ESWL);  Surgeon: Billey Co, MD;  Location: [facility] ORS;  Service: Urology;  Laterality: Left;  . LITHOTRIPSY  2013   x 2    FAMILY HISTORY: Family History  Problem Relation Age of Onset  . Cancer Father 60       lung, smoker  . Aneurysm Mother 66       brain  . Coronary artery disease Neg Hx   . Stroke Neg Hx   . Diabetes Neg Hx     ADVANCED DIRECTIVES (Y/N):  N  HEALTH MAINTENANCE: Social History   Tobacco Use  . Smoking status: Former Smoker    Last attempt to quit: 02/10/2007    Years since quitting: 11.4  . Smokeless tobacco: Never Used  Substance Use Topics  . Alcohol use: Yes    Alcohol/week: 0.0 standard drinks    Comment: 5 drinks/week   . Drug use: Yes    Comment: rare MJ     Colonoscopy:  PAP:  Bone density:  Lipid panel:  Allergies  Allergen Reactions  . Oxycodone Nausea And Vomiting    Current Outpatient Medications  Medication Sig Dispense Refill  . aspirin 81 MG tablet Take 81 mg by mouth daily.    Marland Kitchen  atorvastatin (LIPITOR) 40 MG tablet TAKE 1 TABLET BY MOUTH EVERY DAY 90 tablet 3  . Blood Glucose Monitoring Suppl (BAYER CONTOUR MONITOR) w/Device KIT by Does not apply route. Use to check sugar daily and as needed Dx: E11.65    . fenofibrate 160 MG tablet TAKE 1 TABLET (160 MG TOTAL) BY MOUTH DAILY. 90 tablet 3  . glucose blood test strip 1 each by Other route as needed for other. Use as instructed to check sugar once daily and as needed. E11.65 **Contour**    . Lancets MISC by Does not apply route. Use as directed to check sugar once daily and as needed. Dx. E11.65 **Contour**    .  lisinopril (PRINIVIL,ZESTRIL) 10 MG tablet TAKE 1 TABLET BY MOUTH EVERY DAY 90 tablet 3  . metFORMIN (GLUCOPHAGE) 500 MG tablet TAKE 1 TABLET BY MOUTH EVERY DAY 90 tablet 2  . Multiple Vitamin (MULTIVITAMIN) tablet Take 1 tablet by mouth daily.    . OMEGA-3 FATTY ACIDS PO Take 1 capsule by mouth daily.    Marland Kitchen omeprazole (PRILOSEC OTC) 20 MG tablet Take 20 mg by mouth daily.    . ondansetron (ZOFRAN) 8 MG tablet Take 1 tablet (8 mg total) by mouth 2 (two) times daily as needed. Start on the third day after chemotherapy. 30 tablet 1  . prochlorperazine (COMPAZINE) 10 MG tablet Take 1 tablet (10 mg total) by mouth every 6 (six) hours as needed (Nausea or vomiting). 60 tablet 1  . sildenafil (VIAGRA) 100 MG tablet Take 100 mg by mouth daily as needed for erectile dysfunction.    . sucralfate (CARAFATE) 1 g tablet Take 1 tablet (1 g total) by mouth 3 (three) times daily. Dissolve in 3-4 tbsp warm water, swish and swallow. 90 tablet 3  . tamsulosin (FLOMAX) 0.4 MG CAPS capsule Take 1 capsule (0.4 mg total) by mouth daily after supper. 30 capsule 0   No current facility-administered medications for this visit.     OBJECTIVE: Vitals:   07/05/18 1321  BP: 132/86  Pulse: 83  Temp: 97.9 F (36.6 C)     Body mass index is 31.93 kg/m.    ECOG FS:0 - Asymptomatic  General: Well-developed, well-nourished, no acute distress. Eyes: Pink conjunctiva, anicteric sclera. HEENT: Normocephalic, moist mucous membranes, enlarged right tonsil, no palpable lymphadenopathy. Lungs: Clear to auscultation bilaterally. Heart: Regular rate and rhythm. No rubs, murmurs, or gallops. Abdomen: Soft, nontender, nondistended. No organomegaly noted, normoactive bowel sounds. Musculoskeletal: No edema, cyanosis, or clubbing. Neuro: Alert, answering all questions appropriately. Cranial nerves grossly intact. Skin: No rashes or petechiae noted. Psych: Normal affect.  LAB RESULTS:  Lab Results  Component Value Date   NA  137 07/05/2018   K 4.2 07/05/2018   CL 106 07/05/2018   CO2 23 07/05/2018   GLUCOSE 243 (H) 07/05/2018   BUN 23 07/05/2018   CREATININE 1.14 07/05/2018   CALCIUM 9.1 07/05/2018   PROT 6.5 05/27/2017   ALBUMIN 4.3 05/27/2017   AST 19 05/27/2017   ALT 18 05/27/2017   ALKPHOS 47 05/27/2017   BILITOT 0.8 05/27/2017   GFRNONAA >60 07/05/2018   GFRAA >60 07/05/2018    Lab Results  Component Value Date   WBC 7.3 07/05/2018   NEUTROABS 5.3 07/05/2018   HGB 14.0 07/05/2018   HCT 40.4 07/05/2018   MCV 89.6 07/05/2018   PLT 172 07/05/2018     STUDIES: Nm Pet Image Initial (pi) Skull Base To Thigh  Result Date: 06/09/2018 CLINICAL DATA:  Initial  treatment strategy for 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 EXAM: NUCLEAR MEDICINE PET SKULL BASE TO THIGH TECHNIQUE: 13.4 mCi F-18 FDG was injected intravenously. Full-ring PET imaging was performed from the skull base to thigh after the radiotracer. CT data was obtained and used for attenuation correction and anatomic localization. Fasting blood glucose: 125 mg/dl COMPARISON:  Neck CT 05/23/2018.  Abdomen and pelvis CT 04/21/2018 FINDINGS: Mediastinal blood pool activity: SUV max 3.1 NECK: Asymmetric marked hypermetabolism identified in the right tonsillar region with SUV max = 41.3. Hypermetabolic right cervical level II lymph nodes demonstrate SUV max = 7.5. No evidence for hypermetabolic left cervical lymphadenopathy. Incidental CT findings: none CHEST: No hypermetabolic mediastinal or hilar nodes. No suspicious pulmonary nodules on the CT scan. Incidental CT findings: Coronary artery calcification is evident. Atherosclerotic calcification is noted in the wall of the thoracic aorta. Centrilobular emphsyema noted. ABDOMEN/PELVIS: No abnormal hypermetabolic activity within the liver, pancreas, adrenal glands, or spleen. No hypermetabolic lymph nodes in the abdomen or pelvis. Incidental CT findings: Diffuse low attenuation of the  liver parenchyma is compatible with fatty deposition. Gallbladder is surgically absent. 7 mm nonobstructing stone noted upper pole right kidney. SKELETON: No focal hypermetabolic activity to suggest skeletal metastasis. Incidental CT findings: Focus of activity in the right antecubital fossa is compatible with the injection site. IMPRESSION: 1. Marked hypermetabolism in the right tonsillar region compatible with patient's known malignancy. Hypermetabolic metastatic lymphadenopathy noted right level II cervical lymph nodes. No hypermetabolic metastases in the contralateral neck. 2. No unexpected or suspicious hypermetabolic disease in the chest, abdomen, or pelvis. 3. Hepatic steatosis. 4.  Aortic Atherosclerois (ICD10-170.0) Electronically Signed   By: Misty Stanley M.D.   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  Patient was also discussed recently at tumor board.  He will benefit from concurrent chemotherapy using weekly cisplatin along with daily XRT. He does not require port placement at this time, but will consider one later if his IV access becomes difficult.  Continue daily XRT.  Patient will return to clinic tomorrow for cycle 2 of cisplatin and then in 1 week for further evaluation and consideration of cycle 3.  I spent a total of 30 minutes face-to-face with the patient of which greater than 50% of the visit was spent in counseling and coordination of care as detailed above.   Patient expressed understanding and was in agreement with this plan. He also understands that He can call clinic at any time with any questions, concerns, or complaints.   Cancer Staging Squamous cell carcinoma of right tonsil ([place]) Staging form: Pharynx - HPV-Mediated Oropharynx, AJCC 8th Edition -  Clinical stage from 06/12/2018: Stage I (cT2, cN1, cM0, p16+) - Signed by Lloyd Huger, MD on 06/12/2018   Lloyd Huger, MD   07/06/2018 2:11 PM

## 2018-07-05 ENCOUNTER — Encounter: Payer: Self-pay | Admitting: Oncology

## 2018-07-05 ENCOUNTER — Inpatient Hospital Stay (HOSPITAL_BASED_OUTPATIENT_CLINIC_OR_DEPARTMENT_OTHER): Payer: BC Managed Care – PPO | Admitting: Oncology

## 2018-07-05 ENCOUNTER — Ambulatory Visit
Admission: RE | Admit: 2018-07-05 | Discharge: 2018-07-05 | Disposition: A | Discharge status: Home / Self Care | Payer: BC Managed Care – PPO | Source: Ambulatory Visit | Attending: Radiation Oncology | Admitting: Radiation Oncology

## 2018-07-05 ENCOUNTER — Other Ambulatory Visit: Payer: Self-pay

## 2018-07-05 ENCOUNTER — Inpatient Hospital Stay: Payer: BC Managed Care – PPO

## 2018-07-05 VITALS — BP 132/86 | HR 83 | Temp 97.9°F | Ht 71.0 in | Wt 228.9 lb

## 2018-07-05 DIAGNOSIS — Z7984 Long term (current) use of oral hypoglycemic drugs: Secondary | ICD-10-CM

## 2018-07-05 DIAGNOSIS — E669 Obesity, unspecified: Secondary | ICD-10-CM

## 2018-07-05 DIAGNOSIS — C099 Malignant neoplasm of tonsil, unspecified: Secondary | ICD-10-CM

## 2018-07-05 DIAGNOSIS — Z5111 Encounter for antineoplastic chemotherapy: Secondary | ICD-10-CM | POA: Diagnosis not present

## 2018-07-05 DIAGNOSIS — I10 Essential (primary) hypertension: Secondary | ICD-10-CM

## 2018-07-05 DIAGNOSIS — R590 Localized enlarged lymph nodes: Secondary | ICD-10-CM | POA: Diagnosis not present

## 2018-07-05 DIAGNOSIS — E119 Type 2 diabetes mellitus without complications: Secondary | ICD-10-CM

## 2018-07-05 DIAGNOSIS — Z7982 Long term (current) use of aspirin: Secondary | ICD-10-CM

## 2018-07-05 DIAGNOSIS — E785 Hyperlipidemia, unspecified: Secondary | ICD-10-CM | POA: Diagnosis not present

## 2018-07-05 DIAGNOSIS — N529 Male erectile dysfunction, unspecified: Secondary | ICD-10-CM | POA: Diagnosis not present

## 2018-07-05 DIAGNOSIS — Z79899 Other long term (current) drug therapy: Secondary | ICD-10-CM

## 2018-07-05 DIAGNOSIS — K219 Gastro-esophageal reflux disease without esophagitis: Secondary | ICD-10-CM | POA: Diagnosis not present

## 2018-07-05 DIAGNOSIS — J439 Emphysema, unspecified: Secondary | ICD-10-CM

## 2018-07-05 DIAGNOSIS — Z87442 Personal history of urinary calculi: Secondary | ICD-10-CM | POA: Diagnosis not present

## 2018-07-05 DIAGNOSIS — Z87891 Personal history of nicotine dependence: Secondary | ICD-10-CM

## 2018-07-05 DIAGNOSIS — Z809 Family history of malignant neoplasm, unspecified: Secondary | ICD-10-CM | POA: Diagnosis not present

## 2018-07-05 LAB — CBC WITH DIFFERENTIAL/PLATELET
Abs Immature Granulocytes: 0.04 10*3/uL (ref 0.00–0.07)
Basophils Absolute: 0 10*3/uL (ref 0.0–0.1)
Basophils Relative: 1 %
Eosinophils Absolute: 0.2 10*3/uL (ref 0.0–0.5)
Eosinophils Relative: 3 %
HCT: 40.4 % (ref 39.0–52.0)
Hemoglobin: 14 g/dL (ref 13.0–17.0)
Immature Granulocytes: 1 %
Lymphocytes Relative: 17 %
Lymphs Abs: 1.2 10*3/uL (ref 0.7–4.0)
MCH: 31 pg (ref 26.0–34.0)
MCHC: 34.7 g/dL (ref 30.0–36.0)
MCV: 89.6 fL (ref 80.0–100.0)
Monocytes Absolute: 0.5 10*3/uL (ref 0.1–1.0)
Monocytes Relative: 7 %
Neutro Abs: 5.3 10*3/uL (ref 1.7–7.7)
Neutrophils Relative %: 71 %
Platelets: 172 10*3/uL (ref 150–400)
RBC: 4.51 MIL/uL (ref 4.22–5.81)
RDW: 11.9 % (ref 11.5–15.5)
WBC: 7.3 10*3/uL (ref 4.0–10.5)
nRBC: 0 % (ref 0.0–0.2)

## 2018-07-05 LAB — BASIC METABOLIC PANEL
Anion gap: 8 (ref 5–15)
BUN: 23 mg/dL (ref 8–23)
CO2: 23 mmol/L (ref 22–32)
Calcium: 9.1 mg/dL (ref 8.9–10.3)
Chloride: 106 mmol/L (ref 98–111)
Creatinine, Ser: 1.14 mg/dL (ref 0.61–1.24)
GFR calc Af Amer: >60 mL/min (ref >60)
GFR calc non Af Amer: >60 mL/min (ref >60)
Glucose, Bld: 243 mg/dL — ABNORMAL HIGH (ref 70–99)
Potassium: 4.2 mmol/L (ref 3.5–5.1)
Sodium: 137 mmol/L (ref 135–145)

## 2018-07-05 NOTE — Progress Notes (Signed)
Patient stated that he was doing well with no complaints.

## 2018-07-06 ENCOUNTER — Inpatient Hospital Stay: Payer: BC Managed Care – PPO

## 2018-07-06 ENCOUNTER — Other Ambulatory Visit: Payer: Self-pay

## 2018-07-06 ENCOUNTER — Ambulatory Visit
Admission: RE | Admit: 2018-07-06 | Discharge: 2018-07-06 | Disposition: A | Discharge status: Home / Self Care | Payer: BC Managed Care – PPO | Source: Ambulatory Visit | Attending: Radiation Oncology | Admitting: Radiation Oncology

## 2018-07-06 VITALS — BP 121/73 | HR 81 | Temp 97.5°F | Resp 18

## 2018-07-06 DIAGNOSIS — Z79899 Other long term (current) drug therapy: Secondary | ICD-10-CM | POA: Diagnosis not present

## 2018-07-06 DIAGNOSIS — Z7984 Long term (current) use of oral hypoglycemic drugs: Secondary | ICD-10-CM | POA: Diagnosis not present

## 2018-07-06 DIAGNOSIS — K219 Gastro-esophageal reflux disease without esophagitis: Secondary | ICD-10-CM | POA: Diagnosis not present

## 2018-07-06 DIAGNOSIS — C099 Malignant neoplasm of tonsil, unspecified: Secondary | ICD-10-CM

## 2018-07-06 DIAGNOSIS — Z809 Family history of malignant neoplasm, unspecified: Secondary | ICD-10-CM | POA: Diagnosis not present

## 2018-07-06 DIAGNOSIS — Z87891 Personal history of nicotine dependence: Secondary | ICD-10-CM | POA: Diagnosis not present

## 2018-07-06 DIAGNOSIS — Z7982 Long term (current) use of aspirin: Secondary | ICD-10-CM | POA: Diagnosis not present

## 2018-07-06 DIAGNOSIS — I10 Essential (primary) hypertension: Secondary | ICD-10-CM | POA: Diagnosis not present

## 2018-07-06 DIAGNOSIS — R590 Localized enlarged lymph nodes: Secondary | ICD-10-CM | POA: Diagnosis not present

## 2018-07-06 DIAGNOSIS — Z5111 Encounter for antineoplastic chemotherapy: Secondary | ICD-10-CM | POA: Diagnosis not present

## 2018-07-06 DIAGNOSIS — E785 Hyperlipidemia, unspecified: Secondary | ICD-10-CM | POA: Diagnosis not present

## 2018-07-06 DIAGNOSIS — J439 Emphysema, unspecified: Secondary | ICD-10-CM | POA: Diagnosis not present

## 2018-07-06 DIAGNOSIS — E669 Obesity, unspecified: Secondary | ICD-10-CM | POA: Diagnosis not present

## 2018-07-06 DIAGNOSIS — N529 Male erectile dysfunction, unspecified: Secondary | ICD-10-CM | POA: Diagnosis not present

## 2018-07-06 DIAGNOSIS — Z87442 Personal history of urinary calculi: Secondary | ICD-10-CM | POA: Diagnosis not present

## 2018-07-06 DIAGNOSIS — E119 Type 2 diabetes mellitus without complications: Secondary | ICD-10-CM | POA: Diagnosis not present

## 2018-07-06 MED ORDER — SODIUM CHLORIDE 0.9 % IV SOLN
Freq: Once | INTRAVENOUS | Status: AC
  Administered 2018-07-06: 11:00:00 via INTRAVENOUS
  Filled 2018-07-06: qty 5

## 2018-07-06 MED ORDER — POTASSIUM CHLORIDE 2 MEQ/ML IV SOLN
Freq: Once | INTRAVENOUS | Status: AC
  Administered 2018-07-06: 09:00:00 via INTRAVENOUS
  Filled 2018-07-06: qty 1000

## 2018-07-06 MED ORDER — SODIUM CHLORIDE 0.9 % IV SOLN
Freq: Once | INTRAVENOUS | Status: AC
  Administered 2018-07-06: 09:00:00 via INTRAVENOUS
  Filled 2018-07-06: qty 250

## 2018-07-06 MED ORDER — PALONOSETRON HCL INJECTION 0.25 MG/5ML
0.2500 mg | Freq: Once | INTRAVENOUS | Status: AC
  Administered 2018-07-06: 0.25 mg via INTRAVENOUS
  Filled 2018-07-06: qty 5

## 2018-07-06 MED ORDER — SODIUM CHLORIDE 0.9 % IV SOLN
40.0000 mg/m2 | Freq: Once | INTRAVENOUS | Status: AC
  Administered 2018-07-06: 92 mg via INTRAVENOUS
  Filled 2018-07-06: qty 92

## 2018-07-07 ENCOUNTER — Other Ambulatory Visit: Payer: Self-pay

## 2018-07-07 ENCOUNTER — Ambulatory Visit
Admission: RE | Admit: 2018-07-07 | Discharge: 2018-07-07 | Disposition: A | Discharge status: Home / Self Care | Payer: BC Managed Care – PPO | Source: Ambulatory Visit | Attending: Radiation Oncology | Admitting: Radiation Oncology

## 2018-07-07 DIAGNOSIS — I10 Essential (primary) hypertension: Secondary | ICD-10-CM | POA: Diagnosis not present

## 2018-07-07 DIAGNOSIS — C099 Malignant neoplasm of tonsil, unspecified: Secondary | ICD-10-CM | POA: Diagnosis not present

## 2018-07-07 DIAGNOSIS — Z809 Family history of malignant neoplasm, unspecified: Secondary | ICD-10-CM | POA: Diagnosis not present

## 2018-07-07 DIAGNOSIS — Z5111 Encounter for antineoplastic chemotherapy: Secondary | ICD-10-CM | POA: Diagnosis not present

## 2018-07-07 DIAGNOSIS — K219 Gastro-esophageal reflux disease without esophagitis: Secondary | ICD-10-CM | POA: Diagnosis not present

## 2018-07-07 DIAGNOSIS — Z7982 Long term (current) use of aspirin: Secondary | ICD-10-CM | POA: Diagnosis not present

## 2018-07-07 DIAGNOSIS — J439 Emphysema, unspecified: Secondary | ICD-10-CM | POA: Diagnosis not present

## 2018-07-07 DIAGNOSIS — E785 Hyperlipidemia, unspecified: Secondary | ICD-10-CM | POA: Diagnosis not present

## 2018-07-07 DIAGNOSIS — Z79899 Other long term (current) drug therapy: Secondary | ICD-10-CM | POA: Diagnosis not present

## 2018-07-07 DIAGNOSIS — N529 Male erectile dysfunction, unspecified: Secondary | ICD-10-CM | POA: Diagnosis not present

## 2018-07-07 DIAGNOSIS — Z7984 Long term (current) use of oral hypoglycemic drugs: Secondary | ICD-10-CM | POA: Diagnosis not present

## 2018-07-07 DIAGNOSIS — E669 Obesity, unspecified: Secondary | ICD-10-CM | POA: Diagnosis not present

## 2018-07-07 DIAGNOSIS — E119 Type 2 diabetes mellitus without complications: Secondary | ICD-10-CM | POA: Diagnosis not present

## 2018-07-07 DIAGNOSIS — Z87442 Personal history of urinary calculi: Secondary | ICD-10-CM | POA: Diagnosis not present

## 2018-07-07 DIAGNOSIS — Z87891 Personal history of nicotine dependence: Secondary | ICD-10-CM | POA: Diagnosis not present

## 2018-07-07 DIAGNOSIS — R590 Localized enlarged lymph nodes: Secondary | ICD-10-CM | POA: Diagnosis not present

## 2018-07-08 ENCOUNTER — Ambulatory Visit
Admission: RE | Admit: 2018-07-08 | Discharge: 2018-07-08 | Disposition: A | Discharge status: Home / Self Care | Payer: BC Managed Care – PPO | Source: Ambulatory Visit | Attending: Radiation Oncology | Admitting: Radiation Oncology

## 2018-07-08 ENCOUNTER — Other Ambulatory Visit: Payer: Self-pay

## 2018-07-08 DIAGNOSIS — E669 Obesity, unspecified: Secondary | ICD-10-CM | POA: Diagnosis not present

## 2018-07-08 DIAGNOSIS — J439 Emphysema, unspecified: Secondary | ICD-10-CM | POA: Diagnosis not present

## 2018-07-08 DIAGNOSIS — Z87442 Personal history of urinary calculi: Secondary | ICD-10-CM | POA: Diagnosis not present

## 2018-07-08 DIAGNOSIS — Z87891 Personal history of nicotine dependence: Secondary | ICD-10-CM | POA: Diagnosis not present

## 2018-07-08 DIAGNOSIS — E119 Type 2 diabetes mellitus without complications: Secondary | ICD-10-CM | POA: Diagnosis not present

## 2018-07-08 DIAGNOSIS — I10 Essential (primary) hypertension: Secondary | ICD-10-CM | POA: Diagnosis not present

## 2018-07-08 DIAGNOSIS — Z79899 Other long term (current) drug therapy: Secondary | ICD-10-CM | POA: Diagnosis not present

## 2018-07-08 DIAGNOSIS — Z809 Family history of malignant neoplasm, unspecified: Secondary | ICD-10-CM | POA: Diagnosis not present

## 2018-07-08 DIAGNOSIS — Z7982 Long term (current) use of aspirin: Secondary | ICD-10-CM | POA: Diagnosis not present

## 2018-07-08 DIAGNOSIS — N529 Male erectile dysfunction, unspecified: Secondary | ICD-10-CM | POA: Diagnosis not present

## 2018-07-08 DIAGNOSIS — C099 Malignant neoplasm of tonsil, unspecified: Secondary | ICD-10-CM | POA: Diagnosis not present

## 2018-07-08 DIAGNOSIS — Z5111 Encounter for antineoplastic chemotherapy: Secondary | ICD-10-CM | POA: Diagnosis not present

## 2018-07-08 DIAGNOSIS — Z7984 Long term (current) use of oral hypoglycemic drugs: Secondary | ICD-10-CM | POA: Diagnosis not present

## 2018-07-08 DIAGNOSIS — K219 Gastro-esophageal reflux disease without esophagitis: Secondary | ICD-10-CM | POA: Diagnosis not present

## 2018-07-08 DIAGNOSIS — R590 Localized enlarged lymph nodes: Secondary | ICD-10-CM | POA: Diagnosis not present

## 2018-07-08 DIAGNOSIS — E785 Hyperlipidemia, unspecified: Secondary | ICD-10-CM | POA: Diagnosis not present

## 2018-07-08 NOTE — Progress Notes (Signed)
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  INTERVAL HISTORY: Patient returns to clinic today for further evaluation and consideration of cycle 3 of weekly cisplatin.  He is tolerating his treatments well without significant side effects.  He is tolerating his daily XRT.  He has a mild sore throat and dysphasia, but otherwise feels well.  He denies any weakness or fatigue. He has no neurologic complaints.  He denies any recent fevers or illnesses.  He has a fair appetite and denies weight loss.  He has no chest pain, shortness of breath, cough, or hemoptysis.  He has no nausea, vomiting, constipation, or diarrhea.  He has no urinary complaints.  Patient offers no further specific complaints today.  REVIEW OF SYSTEMS:   Review of Systems  Constitutional: Negative.  Negative for fever, malaise/fatigue and weight loss.  HENT: Positive for sore throat.   Respiratory: Negative.  Negative for cough and shortness of breath.   Cardiovascular: Negative.  Negative for chest pain and leg swelling.  Gastrointestinal: Negative.  Negative for abdominal pain.  Genitourinary: Negative.  Negative for dysuria.  Musculoskeletal: Negative.  Negative for back pain.  Skin: Negative.  Negative for rash.  Neurological: Negative.  Negative for speech change, weakness and headaches.  Psychiatric/Behavioral: Negative.  The patient is not nervous/anxious.     As per HPI. Otherwise, a complete review of systems is negative.  PAST MEDICAL HISTORY: Past Medical History:  Diagnosis Date   ED (erectile dysfunction)    GERD (gastroesophageal reflux disease)    History of kidney stones 2013   s/p lithotripsy   HLD (hyperlipidemia)    HTN (hypertension)    Obesity     T2DM (type 2 diabetes mellitus) ([place])    DSME at [facility] 01/2012   Tonsillar mass 05/23/2018    PAST SURGICAL HISTORY: Past Surgical History:  Procedure Laterality Date   CHOLECYSTECTOMY  2007   COLONOSCOPY  11/2008   2 hyperplastic polyps [name])   EXTRACORPOREAL SHOCK WAVE LITHOTRIPSY Left 04/28/2018   Procedure: EXTRACORPOREAL SHOCK WAVE LITHOTRIPSY (ESWL);  Surgeon: Billey Co, MD;  Location: [facility] ORS;  Service: Urology;  Laterality: Left;   LITHOTRIPSY  2013   x 2    FAMILY HISTORY: Family History  Problem Relation Age of Onset   Cancer Father 68       lung, smoker   Aneurysm Mother 47       brain   Coronary artery disease Neg Hx    Stroke Neg Hx    Diabetes Neg Hx     ADVANCED DIRECTIVES (Y/N):  N  HEALTH MAINTENANCE: Social History   Tobacco Use   Smoking status: Former Smoker    Last attempt to quit: 02/10/2007    Years since quitting: 11.4   Smokeless tobacco: Never Used  Substance Use Topics   Alcohol use: Yes    Alcohol/week: 0.0 standard drinks    Comment: 5 drinks/week    Drug use: Yes    Comment: rare MJ     Colonoscopy:  PAP:  Bone density:  Lipid panel:  Allergies  Allergen Reactions   Oxycodone Nausea And Vomiting    Current Outpatient Medications  Medication Sig Dispense Refill   aspirin 81 MG tablet Take 81 mg by mouth daily.  atorvastatin (LIPITOR) 40 MG tablet TAKE 1 TABLET BY MOUTH EVERY DAY 90 tablet 3   Blood Glucose Monitoring Suppl (BAYER CONTOUR MONITOR) w/Device KIT by Does not apply route. Use to check sugar daily and as needed Dx: E11.65     docusate sodium (COLACE) 100 MG capsule Take 100 mg by mouth daily.     fenofibrate 160 MG tablet TAKE 1 TABLET (160 MG TOTAL) BY MOUTH DAILY. 90 tablet 3   glucose blood test strip 1 each by Other route as needed for other. Use as instructed to check sugar once daily and as needed. E11.65 **Contour**     Lancets MISC by Does not apply route. Use as directed  to check sugar once daily and as needed. Dx. E11.65 **Contour**     lisinopril (PRINIVIL,ZESTRIL) 10 MG tablet TAKE 1 TABLET BY MOUTH EVERY DAY 90 tablet 3   metFORMIN (GLUCOPHAGE) 500 MG tablet TAKE 1 TABLET BY MOUTH EVERY DAY 90 tablet 2   Multiple Vitamin (MULTIVITAMIN) tablet Take 1 tablet by mouth daily.     OMEGA-3 FATTY ACIDS PO Take 1 capsule by mouth daily.     omeprazole (PRILOSEC OTC) 20 MG tablet Take 20 mg by mouth daily.     ondansetron (ZOFRAN) 8 MG tablet Take 1 tablet (8 mg total) by mouth 2 (two) times daily as needed. Start on the third day after chemotherapy. 30 tablet 1   prochlorperazine (COMPAZINE) 10 MG tablet Take 1 tablet (10 mg total) by mouth every 6 (six) hours as needed (Nausea or vomiting). 60 tablet 1   sildenafil (VIAGRA) 100 MG tablet Take 100 mg by mouth daily as needed for erectile dysfunction.     sucralfate (CARAFATE) 1 g tablet Take 1 tablet (1 g total) by mouth 3 (three) times daily. Dissolve in 3-4 tbsp warm water, swish and swallow. 90 tablet 3   tamsulosin (FLOMAX) 0.4 MG CAPS capsule Take 1 capsule (0.4 mg total) by mouth daily after supper. 30 capsule 0   No current facility-administered medications for this visit.     OBJECTIVE: Vitals:   07/12/18 0851  BP: (!) 145/88  Pulse: 84  Temp: 97.6 F (36.4 C)     Body mass index is 31.41 kg/m.    ECOG FS:0 - Asymptomatic  General: Well-developed, well-nourished, no acute distress. Eyes: Pink conjunctiva, anicteric sclera. HEENT: Normocephalic, moist mucous membranes, enlarged right tonsil, no palpable lymphadenopathy. Lungs: Clear to auscultation bilaterally. Heart: Regular rate and rhythm. No rubs, murmurs, or gallops. Abdomen: Soft, nontender, nondistended. No organomegaly noted, normoactive bowel sounds. Musculoskeletal: No edema, cyanosis, or clubbing. Neuro: Alert, answering all questions appropriately. Cranial nerves grossly intact. Skin: No rashes or petechiae noted. Psych:  Normal affect.  LAB RESULTS:  Lab Results  Component Value Date   NA 136 07/12/2018   K 4.4 07/12/2018   CL 103 07/12/2018   CO2 26 07/12/2018   GLUCOSE 210 (H) 07/12/2018   BUN 25 (H) 07/12/2018   CREATININE 1.21 07/12/2018   CALCIUM 9.0 07/12/2018   PROT 6.5 05/27/2017   ALBUMIN 4.3 05/27/2017   AST 19 05/27/2017   ALT 18 05/27/2017   ALKPHOS 47 05/27/2017   BILITOT 0.8 05/27/2017   GFRNONAA >60 07/12/2018   GFRAA >60 07/12/2018    Lab Results  Component Value Date   WBC 7.9 07/12/2018   NEUTROABS 6.3 07/12/2018   HGB 13.8 07/12/2018   HCT 40.6 07/12/2018   MCV 89.8 07/12/2018   PLT 155 07/12/2018  STUDIES: No results found.  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  Patient was also discussed recently at tumor board.  He will benefit from concurrent chemotherapy using weekly cisplatin along with daily XRT. He does not require port placement at this time, but will consider one later if his IV access becomes difficult.  Continue daily XRT.  Proceed with cycle 3 of cisplatin today.  Return to clinic in 1 week for further evaluation and consideration of cycle 4. 2.  Dysphasia: Continue Carafate and Magic mouthwash as prescribed.   3.  Hyperglycemia: Patient's blood glucose remains persistently over 200, monitor.  Patient expressed understanding and was in agreement with this plan. He also understands that He can call clinic at any time with any questions, concerns, or complaints.   Cancer Staging Squamous cell carcinoma of right tonsil ([place]) Staging form: Pharynx - HPV-Mediated Oropharynx, AJCC 8th Edition - Clinical stage from 06/12/2018: Stage I (cT2, cN1, cM0, p16+) - Signed by Lloyd Huger, MD on 06/12/2018   Lloyd Huger, MD   07/13/2018 6:49 AM

## 2018-07-11 ENCOUNTER — Other Ambulatory Visit: Payer: Self-pay

## 2018-07-11 ENCOUNTER — Ambulatory Visit
Admission: RE | Admit: 2018-07-11 | Discharge: 2018-07-11 | Disposition: A | Discharge status: Home / Self Care | Payer: BC Managed Care – PPO | Source: Ambulatory Visit | Attending: Radiation Oncology | Admitting: Radiation Oncology

## 2018-07-11 DIAGNOSIS — R590 Localized enlarged lymph nodes: Secondary | ICD-10-CM | POA: Diagnosis not present

## 2018-07-11 DIAGNOSIS — N529 Male erectile dysfunction, unspecified: Secondary | ICD-10-CM | POA: Diagnosis not present

## 2018-07-11 DIAGNOSIS — Z87891 Personal history of nicotine dependence: Secondary | ICD-10-CM | POA: Diagnosis not present

## 2018-07-11 DIAGNOSIS — Z7982 Long term (current) use of aspirin: Secondary | ICD-10-CM | POA: Diagnosis not present

## 2018-07-11 DIAGNOSIS — Z79899 Other long term (current) drug therapy: Secondary | ICD-10-CM | POA: Diagnosis not present

## 2018-07-11 DIAGNOSIS — J439 Emphysema, unspecified: Secondary | ICD-10-CM | POA: Diagnosis not present

## 2018-07-11 DIAGNOSIS — Z809 Family history of malignant neoplasm, unspecified: Secondary | ICD-10-CM | POA: Diagnosis not present

## 2018-07-11 DIAGNOSIS — I10 Essential (primary) hypertension: Secondary | ICD-10-CM | POA: Insufficient documentation

## 2018-07-11 DIAGNOSIS — K219 Gastro-esophageal reflux disease without esophagitis: Secondary | ICD-10-CM | POA: Insufficient documentation

## 2018-07-11 DIAGNOSIS — Z7984 Long term (current) use of oral hypoglycemic drugs: Secondary | ICD-10-CM | POA: Insufficient documentation

## 2018-07-11 DIAGNOSIS — C099 Malignant neoplasm of tonsil, unspecified: Secondary | ICD-10-CM | POA: Insufficient documentation

## 2018-07-11 DIAGNOSIS — E669 Obesity, unspecified: Secondary | ICD-10-CM | POA: Diagnosis not present

## 2018-07-11 DIAGNOSIS — Z87442 Personal history of urinary calculi: Secondary | ICD-10-CM | POA: Diagnosis not present

## 2018-07-11 DIAGNOSIS — E785 Hyperlipidemia, unspecified: Secondary | ICD-10-CM | POA: Insufficient documentation

## 2018-07-11 DIAGNOSIS — Z5111 Encounter for antineoplastic chemotherapy: Secondary | ICD-10-CM | POA: Diagnosis not present

## 2018-07-11 DIAGNOSIS — E119 Type 2 diabetes mellitus without complications: Secondary | ICD-10-CM | POA: Diagnosis not present

## 2018-07-12 ENCOUNTER — Other Ambulatory Visit: Payer: Self-pay

## 2018-07-12 ENCOUNTER — Inpatient Hospital Stay (HOSPITAL_BASED_OUTPATIENT_CLINIC_OR_DEPARTMENT_OTHER): Payer: BC Managed Care – PPO | Admitting: Oncology

## 2018-07-12 ENCOUNTER — Inpatient Hospital Stay: Payer: BC Managed Care – PPO

## 2018-07-12 ENCOUNTER — Encounter: Payer: Self-pay | Admitting: Oncology

## 2018-07-12 ENCOUNTER — Ambulatory Visit
Admission: RE | Admit: 2018-07-12 | Discharge: 2018-07-12 | Disposition: A | Discharge status: Home / Self Care | Payer: BC Managed Care – PPO | Source: Ambulatory Visit | Attending: Radiation Oncology | Admitting: Radiation Oncology

## 2018-07-12 ENCOUNTER — Inpatient Hospital Stay: Payer: BC Managed Care – PPO | Attending: Oncology

## 2018-07-12 VITALS — BP 145/88 | HR 84 | Temp 97.6°F | Ht 71.0 in | Wt 225.2 lb

## 2018-07-12 DIAGNOSIS — N289 Disorder of kidney and ureter, unspecified: Secondary | ICD-10-CM | POA: Diagnosis not present

## 2018-07-12 DIAGNOSIS — D6959 Other secondary thrombocytopenia: Secondary | ICD-10-CM | POA: Insufficient documentation

## 2018-07-12 DIAGNOSIS — E1165 Type 2 diabetes mellitus with hyperglycemia: Secondary | ICD-10-CM | POA: Insufficient documentation

## 2018-07-12 DIAGNOSIS — E119 Type 2 diabetes mellitus without complications: Secondary | ICD-10-CM | POA: Diagnosis not present

## 2018-07-12 DIAGNOSIS — D709 Neutropenia, unspecified: Secondary | ICD-10-CM | POA: Insufficient documentation

## 2018-07-12 DIAGNOSIS — E669 Obesity, unspecified: Secondary | ICD-10-CM | POA: Diagnosis not present

## 2018-07-12 DIAGNOSIS — N529 Male erectile dysfunction, unspecified: Secondary | ICD-10-CM | POA: Diagnosis not present

## 2018-07-12 DIAGNOSIS — K219 Gastro-esophageal reflux disease without esophagitis: Secondary | ICD-10-CM | POA: Diagnosis not present

## 2018-07-12 DIAGNOSIS — C099 Malignant neoplasm of tonsil, unspecified: Secondary | ICD-10-CM

## 2018-07-12 DIAGNOSIS — Z79899 Other long term (current) drug therapy: Secondary | ICD-10-CM | POA: Diagnosis not present

## 2018-07-12 DIAGNOSIS — R4702 Dysphasia: Secondary | ICD-10-CM | POA: Insufficient documentation

## 2018-07-12 DIAGNOSIS — E785 Hyperlipidemia, unspecified: Secondary | ICD-10-CM | POA: Diagnosis not present

## 2018-07-12 DIAGNOSIS — Z5111 Encounter for antineoplastic chemotherapy: Secondary | ICD-10-CM | POA: Insufficient documentation

## 2018-07-12 DIAGNOSIS — I10 Essential (primary) hypertension: Secondary | ICD-10-CM | POA: Insufficient documentation

## 2018-07-12 DIAGNOSIS — Z7982 Long term (current) use of aspirin: Secondary | ICD-10-CM | POA: Diagnosis not present

## 2018-07-12 DIAGNOSIS — Z87442 Personal history of urinary calculi: Secondary | ICD-10-CM | POA: Diagnosis not present

## 2018-07-12 DIAGNOSIS — Z87891 Personal history of nicotine dependence: Secondary | ICD-10-CM | POA: Diagnosis not present

## 2018-07-12 DIAGNOSIS — Z809 Family history of malignant neoplasm, unspecified: Secondary | ICD-10-CM | POA: Diagnosis not present

## 2018-07-12 DIAGNOSIS — R590 Localized enlarged lymph nodes: Secondary | ICD-10-CM | POA: Diagnosis not present

## 2018-07-12 DIAGNOSIS — J439 Emphysema, unspecified: Secondary | ICD-10-CM | POA: Diagnosis not present

## 2018-07-12 DIAGNOSIS — Z7984 Long term (current) use of oral hypoglycemic drugs: Secondary | ICD-10-CM | POA: Diagnosis not present

## 2018-07-12 LAB — CBC WITH DIFFERENTIAL/PLATELET
Abs Immature Granulocytes: 0.02 10*3/uL (ref 0.00–0.07)
Basophils Absolute: 0 10*3/uL (ref 0.0–0.1)
Basophils Relative: 1 %
Eosinophils Absolute: 0.2 10*3/uL (ref 0.0–0.5)
Eosinophils Relative: 2 %
HCT: 40.6 % (ref 39.0–52.0)
Hemoglobin: 13.8 g/dL (ref 13.0–17.0)
Immature Granulocytes: 0 %
Lymphocytes Relative: 12 %
Lymphs Abs: 1 10*3/uL (ref 0.7–4.0)
MCH: 30.5 pg (ref 26.0–34.0)
MCHC: 34 g/dL (ref 30.0–36.0)
MCV: 89.8 fL (ref 80.0–100.0)
Monocytes Absolute: 0.4 10*3/uL (ref 0.1–1.0)
Monocytes Relative: 5 %
Neutro Abs: 6.3 10*3/uL (ref 1.7–7.7)
Neutrophils Relative %: 80 %
Platelets: 155 10*3/uL (ref 150–400)
RBC: 4.52 MIL/uL (ref 4.22–5.81)
RDW: 11.9 % (ref 11.5–15.5)
WBC: 7.9 10*3/uL (ref 4.0–10.5)
nRBC: 0 % (ref 0.0–0.2)

## 2018-07-12 LAB — BASIC METABOLIC PANEL
Anion gap: 7 (ref 5–15)
BUN: 25 mg/dL — ABNORMAL HIGH (ref 8–23)
CO2: 26 mmol/L (ref 22–32)
Calcium: 9 mg/dL (ref 8.9–10.3)
Chloride: 103 mmol/L (ref 98–111)
Creatinine, Ser: 1.21 mg/dL (ref 0.61–1.24)
GFR calc Af Amer: >60 mL/min (ref >60)
GFR calc non Af Amer: >60 mL/min (ref >60)
Glucose, Bld: 210 mg/dL — ABNORMAL HIGH (ref 70–99)
Potassium: 4.4 mmol/L (ref 3.5–5.1)
Sodium: 136 mmol/L (ref 135–145)

## 2018-07-12 MED ORDER — POTASSIUM CHLORIDE 2 MEQ/ML IV SOLN
Freq: Once | INTRAVENOUS | Status: AC
  Administered 2018-07-12: 10:00:00 via INTRAVENOUS
  Filled 2018-07-12: qty 1000

## 2018-07-12 MED ORDER — SODIUM CHLORIDE 0.9 % IV SOLN
Freq: Once | INTRAVENOUS | Status: AC
  Administered 2018-07-12: 12:00:00 via INTRAVENOUS
  Filled 2018-07-12: qty 5

## 2018-07-12 MED ORDER — SODIUM CHLORIDE 0.9 % IV SOLN
Freq: Once | INTRAVENOUS | Status: AC
  Administered 2018-07-12: 10:00:00 via INTRAVENOUS
  Filled 2018-07-12: qty 250

## 2018-07-12 MED ORDER — PALONOSETRON HCL INJECTION 0.25 MG/5ML
0.2500 mg | Freq: Once | INTRAVENOUS | Status: AC
  Administered 2018-07-12: 12:00:00 0.25 mg via INTRAVENOUS
  Filled 2018-07-12: qty 5

## 2018-07-12 MED ORDER — SODIUM CHLORIDE 0.9 % IV SOLN
40.0000 mg/m2 | Freq: Once | INTRAVENOUS | Status: AC
  Administered 2018-07-12: 92 mg via INTRAVENOUS
  Filled 2018-07-12: qty 92

## 2018-07-12 NOTE — Progress Notes (Signed)
Patient stated that his throat is always hurting. Patient had lost three lbs since last week since food is not tasting good at all. Patient stated that he has been having some constipation lately. He currently takes one stool softener a day and it is not helping much. Patient gets nauseated

## 2018-07-13 ENCOUNTER — Ambulatory Visit
Admission: RE | Admit: 2018-07-13 | Discharge: 2018-07-13 | Disposition: A | Discharge status: Home / Self Care | Payer: BC Managed Care – PPO | Source: Ambulatory Visit | Attending: Radiation Oncology | Admitting: Radiation Oncology

## 2018-07-13 ENCOUNTER — Other Ambulatory Visit: Payer: Self-pay

## 2018-07-13 DIAGNOSIS — Z87442 Personal history of urinary calculi: Secondary | ICD-10-CM | POA: Diagnosis not present

## 2018-07-13 DIAGNOSIS — Z79899 Other long term (current) drug therapy: Secondary | ICD-10-CM | POA: Diagnosis not present

## 2018-07-13 DIAGNOSIS — Z87891 Personal history of nicotine dependence: Secondary | ICD-10-CM | POA: Diagnosis not present

## 2018-07-13 DIAGNOSIS — Z7984 Long term (current) use of oral hypoglycemic drugs: Secondary | ICD-10-CM | POA: Diagnosis not present

## 2018-07-13 DIAGNOSIS — E785 Hyperlipidemia, unspecified: Secondary | ICD-10-CM | POA: Diagnosis not present

## 2018-07-13 DIAGNOSIS — N529 Male erectile dysfunction, unspecified: Secondary | ICD-10-CM | POA: Diagnosis not present

## 2018-07-13 DIAGNOSIS — K219 Gastro-esophageal reflux disease without esophagitis: Secondary | ICD-10-CM | POA: Diagnosis not present

## 2018-07-13 DIAGNOSIS — R590 Localized enlarged lymph nodes: Secondary | ICD-10-CM | POA: Diagnosis not present

## 2018-07-13 DIAGNOSIS — Z5111 Encounter for antineoplastic chemotherapy: Secondary | ICD-10-CM | POA: Diagnosis not present

## 2018-07-13 DIAGNOSIS — Z7982 Long term (current) use of aspirin: Secondary | ICD-10-CM | POA: Diagnosis not present

## 2018-07-13 DIAGNOSIS — C099 Malignant neoplasm of tonsil, unspecified: Secondary | ICD-10-CM | POA: Diagnosis not present

## 2018-07-13 DIAGNOSIS — J439 Emphysema, unspecified: Secondary | ICD-10-CM | POA: Diagnosis not present

## 2018-07-13 DIAGNOSIS — E669 Obesity, unspecified: Secondary | ICD-10-CM | POA: Diagnosis not present

## 2018-07-13 DIAGNOSIS — E119 Type 2 diabetes mellitus without complications: Secondary | ICD-10-CM | POA: Diagnosis not present

## 2018-07-13 DIAGNOSIS — I10 Essential (primary) hypertension: Secondary | ICD-10-CM | POA: Diagnosis not present

## 2018-07-13 DIAGNOSIS — Z809 Family history of malignant neoplasm, unspecified: Secondary | ICD-10-CM | POA: Diagnosis not present

## 2018-07-14 ENCOUNTER — Ambulatory Visit: Payer: BC Managed Care – PPO

## 2018-07-15 ENCOUNTER — Ambulatory Visit
Admission: RE | Admit: 2018-07-15 | Discharge: 2018-07-15 | Disposition: A | Discharge status: Home / Self Care | Payer: BC Managed Care – PPO | Source: Ambulatory Visit | Attending: Radiation Oncology | Admitting: Radiation Oncology

## 2018-07-15 DIAGNOSIS — E669 Obesity, unspecified: Secondary | ICD-10-CM | POA: Diagnosis not present

## 2018-07-15 DIAGNOSIS — Z809 Family history of malignant neoplasm, unspecified: Secondary | ICD-10-CM | POA: Diagnosis not present

## 2018-07-15 DIAGNOSIS — E785 Hyperlipidemia, unspecified: Secondary | ICD-10-CM | POA: Diagnosis not present

## 2018-07-15 DIAGNOSIS — Z7982 Long term (current) use of aspirin: Secondary | ICD-10-CM | POA: Diagnosis not present

## 2018-07-15 DIAGNOSIS — Z87442 Personal history of urinary calculi: Secondary | ICD-10-CM | POA: Diagnosis not present

## 2018-07-15 DIAGNOSIS — C099 Malignant neoplasm of tonsil, unspecified: Secondary | ICD-10-CM | POA: Diagnosis not present

## 2018-07-15 DIAGNOSIS — Z5111 Encounter for antineoplastic chemotherapy: Secondary | ICD-10-CM | POA: Diagnosis not present

## 2018-07-15 DIAGNOSIS — N529 Male erectile dysfunction, unspecified: Secondary | ICD-10-CM | POA: Diagnosis not present

## 2018-07-15 DIAGNOSIS — J439 Emphysema, unspecified: Secondary | ICD-10-CM | POA: Diagnosis not present

## 2018-07-15 DIAGNOSIS — I10 Essential (primary) hypertension: Secondary | ICD-10-CM | POA: Diagnosis not present

## 2018-07-15 DIAGNOSIS — Z79899 Other long term (current) drug therapy: Secondary | ICD-10-CM | POA: Diagnosis not present

## 2018-07-15 DIAGNOSIS — Z87891 Personal history of nicotine dependence: Secondary | ICD-10-CM | POA: Diagnosis not present

## 2018-07-15 DIAGNOSIS — R590 Localized enlarged lymph nodes: Secondary | ICD-10-CM | POA: Diagnosis not present

## 2018-07-15 DIAGNOSIS — Z7984 Long term (current) use of oral hypoglycemic drugs: Secondary | ICD-10-CM | POA: Diagnosis not present

## 2018-07-15 DIAGNOSIS — K219 Gastro-esophageal reflux disease without esophagitis: Secondary | ICD-10-CM | POA: Diagnosis not present

## 2018-07-15 DIAGNOSIS — E119 Type 2 diabetes mellitus without complications: Secondary | ICD-10-CM | POA: Diagnosis not present

## 2018-07-17 ENCOUNTER — Other Ambulatory Visit: Payer: Self-pay | Admitting: Family Medicine

## 2018-07-17 NOTE — Progress Notes (Signed)
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  INTERVAL HISTORY: Patient returns to clinic today for further evaluation and consideration of cycle 4 of weekly cisplatin.  He continues to tolerate his treatments well with only mild dysphasia.  He currently feels well and is asymptomatic.  He denies any weakness or fatigue. He has no neurologic complaints.  He denies any recent fevers or illnesses.  He has a fair appetite and denies weight loss.  He has no chest pain, shortness of breath, cough, or hemoptysis.  He has no nausea, vomiting, constipation, or diarrhea.  He has no urinary complaints.  Patient offers no further specific complaints today.  REVIEW OF SYSTEMS:   Review of Systems  Constitutional: Negative.  Negative for fever, malaise/fatigue and weight loss.  HENT: Positive for sore throat.   Respiratory: Negative.  Negative for cough and shortness of breath.   Cardiovascular: Negative.  Negative for chest pain and leg swelling.  Gastrointestinal: Negative.  Negative for abdominal pain.  Genitourinary: Negative.  Negative for dysuria.  Musculoskeletal: Negative.  Negative for back pain.  Skin: Negative.  Negative for rash.  Neurological: Negative.  Negative for speech change, weakness and headaches.  Psychiatric/Behavioral: Negative.  The patient is not nervous/anxious.     As per HPI. Otherwise, a complete review of systems is negative.  PAST MEDICAL HISTORY: Past Medical History:  Diagnosis Date  . ED (erectile dysfunction)   . GERD (gastroesophageal reflux disease)   . History of kidney stones 2013   s/p lithotripsy  . HLD (hyperlipidemia)   . HTN (hypertension)   . Obesity   . T2DM (type 2 diabetes mellitus) ([place])    DSME at  [facility] 01/2012  . Tonsillar mass 05/23/2018    PAST SURGICAL HISTORY: Past Surgical History:  Procedure Laterality Date  . CHOLECYSTECTOMY  2007  . COLONOSCOPY  11/2008   2 hyperplastic polyps [name])  . EXTRACORPOREAL SHOCK WAVE LITHOTRIPSY Left 04/28/2018   Procedure: EXTRACORPOREAL SHOCK WAVE LITHOTRIPSY (ESWL);  Surgeon: Billey Co, MD;  Location: [facility] ORS;  Service: Urology;  Laterality: Left;  . LITHOTRIPSY  2013   x 2    FAMILY HISTORY: Family History  Problem Relation Age of Onset  . Cancer Father 32       lung, smoker  . Aneurysm Mother 66       brain  . Coronary artery disease Neg Hx   . Stroke Neg Hx   . Diabetes Neg Hx     ADVANCED DIRECTIVES (Y/N):  N  HEALTH MAINTENANCE: Social History   Tobacco Use  . Smoking status: Former Smoker    Last attempt to quit: 02/10/2007    Years since quitting: 11.4  . Smokeless tobacco: Never Used  Substance Use Topics  . Alcohol use: Yes    Alcohol/week: 0.0 standard drinks    Comment: 5 drinks/week   . Drug use: Yes    Comment: rare MJ     Colonoscopy:  PAP:  Bone density:  Lipid panel:  Allergies  Allergen Reactions  . Oxycodone Nausea And Vomiting    Current Outpatient Medications  Medication Sig Dispense Refill  . aspirin 81 MG tablet Take 81 mg by mouth daily.    Marland Kitchen atorvastatin (LIPITOR) 40 MG tablet TAKE 1 TABLET BY  MOUTH EVERY DAY 90 tablet 0  . Blood Glucose Monitoring Suppl (BAYER CONTOUR MONITOR) w/Device KIT by Does not apply route. Use to check sugar daily and as needed Dx: E11.65    . docusate sodium (COLACE) 100 MG capsule Take 100 mg by mouth daily.    . fenofibrate 160 MG tablet TAKE 1 TABLET (160 MG TOTAL) BY MOUTH DAILY. 90 tablet 3  . glucose blood test strip 1 each by Other route as needed for other. Use as instructed to check sugar once daily and as needed. E11.65 **Contour**    . Lancets MISC by Does not apply route. Use as directed to check sugar once daily and as needed. Dx. E11.65  **Contour**    . lisinopril (ZESTRIL) 10 MG tablet TAKE 1 TABLET BY MOUTH EVERY DAY 90 tablet 0  . metFORMIN (GLUCOPHAGE) 500 MG tablet TAKE 1 TABLET BY MOUTH EVERY DAY 90 tablet 0  . Multiple Vitamin (MULTIVITAMIN) tablet Take 1 tablet by mouth daily.    . OMEGA-3 FATTY ACIDS PO Take 1 capsule by mouth daily.    Marland Kitchen omeprazole (PRILOSEC OTC) 20 MG tablet Take 20 mg by mouth daily.    . ondansetron (ZOFRAN) 8 MG tablet Take 1 tablet (8 mg total) by mouth 2 (two) times daily as needed. Start on the third day after chemotherapy. 30 tablet 1  . prochlorperazine (COMPAZINE) 10 MG tablet Take 1 tablet (10 mg total) by mouth every 6 (six) hours as needed (Nausea or vomiting). 60 tablet 1  . sildenafil (VIAGRA) 100 MG tablet Take 100 mg by mouth daily as needed for erectile dysfunction.    . sucralfate (CARAFATE) 1 g tablet Take 1 tablet (1 g total) by mouth 3 (three) times daily. Dissolve in 3-4 tbsp warm water, swish and swallow. 90 tablet 3  . tamsulosin (FLOMAX) 0.4 MG CAPS capsule Take 1 capsule (0.4 mg total) by mouth daily after supper. 30 capsule 0   No current facility-administered medications for this visit.     OBJECTIVE: Vitals:   07/19/18 0833  BP: 124/84  Pulse: 70  Temp: 97.8 F (36.6 C)     Body mass index is 31.26 kg/m.    ECOG FS:0 - Asymptomatic  General: Well-developed, well-nourished, no acute distress. Eyes: Pink conjunctiva, anicteric sclera. HEENT: Normocephalic, moist mucous membranes, enlarged right tonsil, decrease in size.  No palpable lymphadenopathy. Lungs: Clear to auscultation bilaterally. Heart: Regular rate and rhythm. No rubs, murmurs, or gallops. Abdomen: Soft, nontender, nondistended. No organomegaly noted, normoactive bowel sounds. Musculoskeletal: No edema, cyanosis, or clubbing. Neuro: Alert, answering all questions appropriately. Cranial nerves grossly intact. Skin: No rashes or petechiae noted. Psych: Normal affect.  LAB RESULTS:  Lab Results   Component Value Date   NA 139 07/19/2018   K 4.2 07/19/2018   CL 105 07/19/2018   CO2 24 07/19/2018   GLUCOSE 158 (H) 07/19/2018   BUN 26 (H) 07/19/2018   CREATININE 1.28 (H) 07/19/2018   CALCIUM 9.3 07/19/2018   PROT 6.5 05/27/2017   ALBUMIN 4.3 05/27/2017   AST 19 05/27/2017   ALT 18 05/27/2017   ALKPHOS 47 05/27/2017   BILITOT 0.8 05/27/2017   GFRNONAA 60 (L) 07/19/2018   GFRAA >60 07/19/2018    Lab Results  Component Value Date   WBC 4.8 07/19/2018   NEUTROABS 3.7 07/19/2018   HGB 12.6 (L) 07/19/2018   HCT 36.4 (L) 07/19/2018   MCV 88.3 07/19/2018   PLT 151 07/19/2018     STUDIES: No  results found.  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  Patient was also discussed recently at tumor board.  He will benefit from concurrent chemotherapy using weekly cisplatin along with daily XRT. He does not require port placement at this time, but will consider one later if his IV access becomes difficult.  Continue daily XRT completing on August 18, 2018.  Proceed with cycle 4 of cisplatin today.  Return to clinic in 1 week for further evaluation and consideration of cycle 5.   2.  Dysphasia: Continue Carafate and Magic mouthwash as prescribed.   3.  Hyperglycemia: Improved control, monitor. 4.  Renal insufficiency: Patient's creatinine has trended up slightly to 1.28.  Monitor while on cisplatin.  Patient expressed understanding and was in agreement with this plan. He also understands that He can call clinic at any time with any questions, concerns, or complaints.   Cancer Staging Squamous cell carcinoma of right tonsil ([place]) Staging form: Pharynx - HPV-Mediated Oropharynx, AJCC 8th Edition - Clinical stage from 06/12/2018: Stage I (cT2, cN1, cM0, p16+) - Signed by Lloyd Huger, MD on  06/12/2018   Lloyd Huger, MD   07/19/2018 3:34 PM

## 2018-07-18 ENCOUNTER — Ambulatory Visit
Admission: RE | Admit: 2018-07-18 | Discharge: 2018-07-18 | Disposition: A | Discharge status: Home / Self Care | Payer: BC Managed Care – PPO | Source: Ambulatory Visit | Attending: Radiation Oncology | Admitting: Radiation Oncology

## 2018-07-18 ENCOUNTER — Other Ambulatory Visit: Payer: Self-pay

## 2018-07-18 DIAGNOSIS — Z5111 Encounter for antineoplastic chemotherapy: Secondary | ICD-10-CM | POA: Diagnosis not present

## 2018-07-18 DIAGNOSIS — Z7984 Long term (current) use of oral hypoglycemic drugs: Secondary | ICD-10-CM | POA: Diagnosis not present

## 2018-07-18 DIAGNOSIS — Z87891 Personal history of nicotine dependence: Secondary | ICD-10-CM | POA: Diagnosis not present

## 2018-07-18 DIAGNOSIS — Z79899 Other long term (current) drug therapy: Secondary | ICD-10-CM | POA: Diagnosis not present

## 2018-07-18 DIAGNOSIS — R590 Localized enlarged lymph nodes: Secondary | ICD-10-CM | POA: Diagnosis not present

## 2018-07-18 DIAGNOSIS — Z87442 Personal history of urinary calculi: Secondary | ICD-10-CM | POA: Diagnosis not present

## 2018-07-18 DIAGNOSIS — C099 Malignant neoplasm of tonsil, unspecified: Secondary | ICD-10-CM | POA: Diagnosis not present

## 2018-07-18 DIAGNOSIS — K219 Gastro-esophageal reflux disease without esophagitis: Secondary | ICD-10-CM | POA: Diagnosis not present

## 2018-07-18 DIAGNOSIS — E669 Obesity, unspecified: Secondary | ICD-10-CM | POA: Diagnosis not present

## 2018-07-18 DIAGNOSIS — N529 Male erectile dysfunction, unspecified: Secondary | ICD-10-CM | POA: Diagnosis not present

## 2018-07-18 DIAGNOSIS — Z809 Family history of malignant neoplasm, unspecified: Secondary | ICD-10-CM | POA: Diagnosis not present

## 2018-07-18 DIAGNOSIS — E119 Type 2 diabetes mellitus without complications: Secondary | ICD-10-CM | POA: Diagnosis not present

## 2018-07-18 DIAGNOSIS — Z7982 Long term (current) use of aspirin: Secondary | ICD-10-CM | POA: Diagnosis not present

## 2018-07-18 DIAGNOSIS — I10 Essential (primary) hypertension: Secondary | ICD-10-CM | POA: Diagnosis not present

## 2018-07-18 DIAGNOSIS — E785 Hyperlipidemia, unspecified: Secondary | ICD-10-CM | POA: Diagnosis not present

## 2018-07-18 DIAGNOSIS — J439 Emphysema, unspecified: Secondary | ICD-10-CM | POA: Diagnosis not present

## 2018-07-19 ENCOUNTER — Inpatient Hospital Stay (HOSPITAL_BASED_OUTPATIENT_CLINIC_OR_DEPARTMENT_OTHER): Payer: BC Managed Care – PPO | Admitting: Oncology

## 2018-07-19 ENCOUNTER — Ambulatory Visit
Admission: RE | Admit: 2018-07-19 | Discharge: 2018-07-19 | Disposition: A | Discharge status: Home / Self Care | Payer: BC Managed Care – PPO | Source: Ambulatory Visit | Attending: Radiation Oncology | Admitting: Radiation Oncology

## 2018-07-19 ENCOUNTER — Other Ambulatory Visit: Payer: Self-pay

## 2018-07-19 ENCOUNTER — Inpatient Hospital Stay: Payer: BC Managed Care – PPO

## 2018-07-19 ENCOUNTER — Encounter: Payer: Self-pay | Admitting: Oncology

## 2018-07-19 VITALS — BP 124/84 | HR 70 | Temp 97.8°F | Ht 71.0 in | Wt 224.1 lb

## 2018-07-19 DIAGNOSIS — Z7984 Long term (current) use of oral hypoglycemic drugs: Secondary | ICD-10-CM | POA: Diagnosis not present

## 2018-07-19 DIAGNOSIS — E1165 Type 2 diabetes mellitus with hyperglycemia: Secondary | ICD-10-CM

## 2018-07-19 DIAGNOSIS — Z5111 Encounter for antineoplastic chemotherapy: Secondary | ICD-10-CM | POA: Diagnosis not present

## 2018-07-19 DIAGNOSIS — E669 Obesity, unspecified: Secondary | ICD-10-CM | POA: Diagnosis not present

## 2018-07-19 DIAGNOSIS — N289 Disorder of kidney and ureter, unspecified: Secondary | ICD-10-CM | POA: Diagnosis not present

## 2018-07-19 DIAGNOSIS — C099 Malignant neoplasm of tonsil, unspecified: Secondary | ICD-10-CM

## 2018-07-19 DIAGNOSIS — D709 Neutropenia, unspecified: Secondary | ICD-10-CM | POA: Diagnosis not present

## 2018-07-19 DIAGNOSIS — E785 Hyperlipidemia, unspecified: Secondary | ICD-10-CM | POA: Diagnosis not present

## 2018-07-19 DIAGNOSIS — N529 Male erectile dysfunction, unspecified: Secondary | ICD-10-CM | POA: Diagnosis not present

## 2018-07-19 DIAGNOSIS — K219 Gastro-esophageal reflux disease without esophagitis: Secondary | ICD-10-CM | POA: Diagnosis not present

## 2018-07-19 DIAGNOSIS — D6959 Other secondary thrombocytopenia: Secondary | ICD-10-CM | POA: Diagnosis not present

## 2018-07-19 DIAGNOSIS — J439 Emphysema, unspecified: Secondary | ICD-10-CM | POA: Diagnosis not present

## 2018-07-19 DIAGNOSIS — E119 Type 2 diabetes mellitus without complications: Secondary | ICD-10-CM | POA: Diagnosis not present

## 2018-07-19 DIAGNOSIS — Z79899 Other long term (current) drug therapy: Secondary | ICD-10-CM | POA: Diagnosis not present

## 2018-07-19 DIAGNOSIS — R590 Localized enlarged lymph nodes: Secondary | ICD-10-CM | POA: Diagnosis not present

## 2018-07-19 DIAGNOSIS — R4702 Dysphasia: Secondary | ICD-10-CM | POA: Diagnosis not present

## 2018-07-19 DIAGNOSIS — I10 Essential (primary) hypertension: Secondary | ICD-10-CM | POA: Diagnosis not present

## 2018-07-19 DIAGNOSIS — Z87891 Personal history of nicotine dependence: Secondary | ICD-10-CM | POA: Diagnosis not present

## 2018-07-19 DIAGNOSIS — Z809 Family history of malignant neoplasm, unspecified: Secondary | ICD-10-CM | POA: Diagnosis not present

## 2018-07-19 DIAGNOSIS — Z87442 Personal history of urinary calculi: Secondary | ICD-10-CM | POA: Diagnosis not present

## 2018-07-19 DIAGNOSIS — Z7982 Long term (current) use of aspirin: Secondary | ICD-10-CM | POA: Diagnosis not present

## 2018-07-19 LAB — CBC WITH DIFFERENTIAL/PLATELET
Abs Immature Granulocytes: 0.03 10*3/uL (ref 0.00–0.07)
Basophils Absolute: 0 10*3/uL (ref 0.0–0.1)
Basophils Relative: 1 %
Eosinophils Absolute: 0.1 10*3/uL (ref 0.0–0.5)
Eosinophils Relative: 2 %
HCT: 36.4 % — ABNORMAL LOW (ref 39.0–52.0)
Hemoglobin: 12.6 g/dL — ABNORMAL LOW (ref 13.0–17.0)
Immature Granulocytes: 1 %
Lymphocytes Relative: 14 %
Lymphs Abs: 0.7 10*3/uL (ref 0.7–4.0)
MCH: 30.6 pg (ref 26.0–34.0)
MCHC: 34.6 g/dL (ref 30.0–36.0)
MCV: 88.3 fL (ref 80.0–100.0)
Monocytes Absolute: 0.3 10*3/uL (ref 0.1–1.0)
Monocytes Relative: 6 %
Neutro Abs: 3.7 10*3/uL (ref 1.7–7.7)
Neutrophils Relative %: 76 %
Platelets: 151 10*3/uL (ref 150–400)
RBC: 4.12 MIL/uL — ABNORMAL LOW (ref 4.22–5.81)
RDW: 11.7 % (ref 11.5–15.5)
WBC: 4.8 10*3/uL (ref 4.0–10.5)
nRBC: 0 % (ref 0.0–0.2)

## 2018-07-19 LAB — BASIC METABOLIC PANEL
Anion gap: 10 (ref 5–15)
BUN: 26 mg/dL — ABNORMAL HIGH (ref 8–23)
CO2: 24 mmol/L (ref 22–32)
Calcium: 9.3 mg/dL (ref 8.9–10.3)
Chloride: 105 mmol/L (ref 98–111)
Creatinine, Ser: 1.28 mg/dL — ABNORMAL HIGH (ref 0.61–1.24)
GFR calc Af Amer: >60 mL/min (ref >60)
GFR calc non Af Amer: 60 mL/min — ABNORMAL LOW (ref >60)
Glucose, Bld: 158 mg/dL — ABNORMAL HIGH (ref 70–99)
Potassium: 4.2 mmol/L (ref 3.5–5.1)
Sodium: 139 mmol/L (ref 135–145)

## 2018-07-19 MED ORDER — SODIUM CHLORIDE 0.9 % IV SOLN
Freq: Once | INTRAVENOUS | Status: AC
  Administered 2018-07-19: 10:00:00 via INTRAVENOUS
  Filled 2018-07-19: qty 250

## 2018-07-19 MED ORDER — POTASSIUM CHLORIDE 2 MEQ/ML IV SOLN
Freq: Once | INTRAVENOUS | Status: AC
  Administered 2018-07-19: 10:00:00 via INTRAVENOUS
  Filled 2018-07-19: qty 1000

## 2018-07-19 MED ORDER — PALONOSETRON HCL INJECTION 0.25 MG/5ML
0.2500 mg | Freq: Once | INTRAVENOUS | Status: AC
  Administered 2018-07-19: 0.25 mg via INTRAVENOUS
  Filled 2018-07-19: qty 5

## 2018-07-19 MED ORDER — SODIUM CHLORIDE 0.9 % IV SOLN
40.0000 mg/m2 | Freq: Once | INTRAVENOUS | Status: AC
  Administered 2018-07-19: 92 mg via INTRAVENOUS
  Filled 2018-07-19: qty 92

## 2018-07-19 MED ORDER — SODIUM CHLORIDE 0.9 % IV SOLN
Freq: Once | INTRAVENOUS | Status: AC
  Administered 2018-07-19: 12:00:00 via INTRAVENOUS
  Filled 2018-07-19: qty 5

## 2018-07-19 NOTE — Progress Notes (Signed)
Patient stated that food is not tasting good at all. Patient stated that he's had a blister on his left side of his lip and would like to know if there is anything that he could apply on it.

## 2018-07-20 ENCOUNTER — Other Ambulatory Visit: Payer: Self-pay

## 2018-07-20 ENCOUNTER — Ambulatory Visit
Admission: RE | Admit: 2018-07-20 | Discharge: 2018-07-20 | Disposition: A | Discharge status: Home / Self Care | Payer: BC Managed Care – PPO | Source: Ambulatory Visit | Attending: Radiation Oncology | Admitting: Radiation Oncology

## 2018-07-20 DIAGNOSIS — Z809 Family history of malignant neoplasm, unspecified: Secondary | ICD-10-CM | POA: Diagnosis not present

## 2018-07-20 DIAGNOSIS — Z7984 Long term (current) use of oral hypoglycemic drugs: Secondary | ICD-10-CM | POA: Diagnosis not present

## 2018-07-20 DIAGNOSIS — Z7982 Long term (current) use of aspirin: Secondary | ICD-10-CM | POA: Diagnosis not present

## 2018-07-20 DIAGNOSIS — K219 Gastro-esophageal reflux disease without esophagitis: Secondary | ICD-10-CM | POA: Diagnosis not present

## 2018-07-20 DIAGNOSIS — J439 Emphysema, unspecified: Secondary | ICD-10-CM | POA: Diagnosis not present

## 2018-07-20 DIAGNOSIS — N529 Male erectile dysfunction, unspecified: Secondary | ICD-10-CM | POA: Diagnosis not present

## 2018-07-20 DIAGNOSIS — C109 Malignant neoplasm of oropharynx, unspecified: Secondary | ICD-10-CM | POA: Diagnosis not present

## 2018-07-20 DIAGNOSIS — E785 Hyperlipidemia, unspecified: Secondary | ICD-10-CM | POA: Diagnosis not present

## 2018-07-20 DIAGNOSIS — Z79899 Other long term (current) drug therapy: Secondary | ICD-10-CM | POA: Diagnosis not present

## 2018-07-20 DIAGNOSIS — Z5111 Encounter for antineoplastic chemotherapy: Secondary | ICD-10-CM | POA: Diagnosis not present

## 2018-07-20 DIAGNOSIS — I10 Essential (primary) hypertension: Secondary | ICD-10-CM | POA: Diagnosis not present

## 2018-07-20 DIAGNOSIS — E669 Obesity, unspecified: Secondary | ICD-10-CM | POA: Diagnosis not present

## 2018-07-20 DIAGNOSIS — E119 Type 2 diabetes mellitus without complications: Secondary | ICD-10-CM | POA: Diagnosis not present

## 2018-07-20 DIAGNOSIS — Z87891 Personal history of nicotine dependence: Secondary | ICD-10-CM | POA: Diagnosis not present

## 2018-07-20 DIAGNOSIS — R590 Localized enlarged lymph nodes: Secondary | ICD-10-CM | POA: Diagnosis not present

## 2018-07-20 DIAGNOSIS — C099 Malignant neoplasm of tonsil, unspecified: Secondary | ICD-10-CM | POA: Diagnosis not present

## 2018-07-20 DIAGNOSIS — Z87442 Personal history of urinary calculi: Secondary | ICD-10-CM | POA: Diagnosis not present

## 2018-07-21 ENCOUNTER — Other Ambulatory Visit: Payer: Self-pay

## 2018-07-21 ENCOUNTER — Ambulatory Visit: Payer: BC Managed Care – PPO

## 2018-07-21 DIAGNOSIS — J439 Emphysema, unspecified: Secondary | ICD-10-CM | POA: Diagnosis not present

## 2018-07-21 DIAGNOSIS — Z809 Family history of malignant neoplasm, unspecified: Secondary | ICD-10-CM | POA: Diagnosis not present

## 2018-07-21 DIAGNOSIS — C099 Malignant neoplasm of tonsil, unspecified: Secondary | ICD-10-CM | POA: Diagnosis not present

## 2018-07-21 DIAGNOSIS — Z87891 Personal history of nicotine dependence: Secondary | ICD-10-CM | POA: Diagnosis not present

## 2018-07-21 DIAGNOSIS — Z5111 Encounter for antineoplastic chemotherapy: Secondary | ICD-10-CM | POA: Diagnosis not present

## 2018-07-21 DIAGNOSIS — Z79899 Other long term (current) drug therapy: Secondary | ICD-10-CM | POA: Diagnosis not present

## 2018-07-21 DIAGNOSIS — E669 Obesity, unspecified: Secondary | ICD-10-CM | POA: Diagnosis not present

## 2018-07-21 DIAGNOSIS — Z7984 Long term (current) use of oral hypoglycemic drugs: Secondary | ICD-10-CM | POA: Diagnosis not present

## 2018-07-21 DIAGNOSIS — E119 Type 2 diabetes mellitus without complications: Secondary | ICD-10-CM | POA: Diagnosis not present

## 2018-07-21 DIAGNOSIS — Z7982 Long term (current) use of aspirin: Secondary | ICD-10-CM | POA: Diagnosis not present

## 2018-07-21 DIAGNOSIS — I10 Essential (primary) hypertension: Secondary | ICD-10-CM | POA: Diagnosis not present

## 2018-07-21 DIAGNOSIS — R590 Localized enlarged lymph nodes: Secondary | ICD-10-CM | POA: Diagnosis not present

## 2018-07-21 DIAGNOSIS — N529 Male erectile dysfunction, unspecified: Secondary | ICD-10-CM | POA: Diagnosis not present

## 2018-07-21 DIAGNOSIS — E785 Hyperlipidemia, unspecified: Secondary | ICD-10-CM | POA: Diagnosis not present

## 2018-07-21 DIAGNOSIS — Z87442 Personal history of urinary calculi: Secondary | ICD-10-CM | POA: Diagnosis not present

## 2018-07-21 DIAGNOSIS — K219 Gastro-esophageal reflux disease without esophagitis: Secondary | ICD-10-CM | POA: Diagnosis not present

## 2018-07-22 ENCOUNTER — Other Ambulatory Visit: Payer: Self-pay

## 2018-07-22 ENCOUNTER — Ambulatory Visit
Admission: RE | Admit: 2018-07-22 | Discharge: 2018-07-22 | Disposition: A | Discharge status: Home / Self Care | Payer: BC Managed Care – PPO | Source: Ambulatory Visit | Attending: Radiation Oncology | Admitting: Radiation Oncology

## 2018-07-22 ENCOUNTER — Encounter: Payer: Self-pay | Admitting: Family Medicine

## 2018-07-22 DIAGNOSIS — Z7984 Long term (current) use of oral hypoglycemic drugs: Secondary | ICD-10-CM | POA: Diagnosis not present

## 2018-07-22 DIAGNOSIS — Z7982 Long term (current) use of aspirin: Secondary | ICD-10-CM | POA: Diagnosis not present

## 2018-07-22 DIAGNOSIS — Z87891 Personal history of nicotine dependence: Secondary | ICD-10-CM | POA: Diagnosis not present

## 2018-07-22 DIAGNOSIS — E119 Type 2 diabetes mellitus without complications: Secondary | ICD-10-CM | POA: Diagnosis not present

## 2018-07-22 DIAGNOSIS — E785 Hyperlipidemia, unspecified: Secondary | ICD-10-CM | POA: Diagnosis not present

## 2018-07-22 DIAGNOSIS — C099 Malignant neoplasm of tonsil, unspecified: Secondary | ICD-10-CM | POA: Diagnosis not present

## 2018-07-22 DIAGNOSIS — N529 Male erectile dysfunction, unspecified: Secondary | ICD-10-CM | POA: Diagnosis not present

## 2018-07-22 DIAGNOSIS — E669 Obesity, unspecified: Secondary | ICD-10-CM | POA: Diagnosis not present

## 2018-07-22 DIAGNOSIS — Z87442 Personal history of urinary calculi: Secondary | ICD-10-CM | POA: Diagnosis not present

## 2018-07-22 DIAGNOSIS — Z79899 Other long term (current) drug therapy: Secondary | ICD-10-CM | POA: Diagnosis not present

## 2018-07-22 DIAGNOSIS — J439 Emphysema, unspecified: Secondary | ICD-10-CM | POA: Diagnosis not present

## 2018-07-22 DIAGNOSIS — R590 Localized enlarged lymph nodes: Secondary | ICD-10-CM | POA: Diagnosis not present

## 2018-07-22 DIAGNOSIS — Z5111 Encounter for antineoplastic chemotherapy: Secondary | ICD-10-CM | POA: Diagnosis not present

## 2018-07-22 DIAGNOSIS — Z809 Family history of malignant neoplasm, unspecified: Secondary | ICD-10-CM | POA: Diagnosis not present

## 2018-07-22 DIAGNOSIS — K219 Gastro-esophageal reflux disease without esophagitis: Secondary | ICD-10-CM | POA: Diagnosis not present

## 2018-07-22 DIAGNOSIS — I10 Essential (primary) hypertension: Secondary | ICD-10-CM | POA: Diagnosis not present

## 2018-07-22 NOTE — Telephone Encounter (Signed)
Spoke with patient. Reasonable to decrease chol meds - will alternate statin with fibrate daily.  Will update labs 09/2018 at CPE.

## 2018-07-24 NOTE — Progress Notes (Signed)
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  INTERVAL HISTORY: Patient returns to clinic today for further evaluation and consideration of 5 of weekly cisplatin.  He has a decreased appetite, but otherwise is tolerating his treatments well.  He continues to have a mild dysphasia. He denies any weakness or fatigue. He has no neurologic complaints.  He denies any recent fevers or illnesses. He has no chest pain, shortness of breath, cough, or hemoptysis.  He has no nausea, vomiting, constipation, or diarrhea.  He has no urinary complaints.  Patient offers no further specific complaints today.  REVIEW OF SYSTEMS:   Review of Systems  Constitutional: Negative.  Negative for fever, malaise/fatigue and weight loss.  HENT: Positive for sore throat.   Respiratory: Negative.  Negative for cough and shortness of breath.   Cardiovascular: Negative.  Negative for chest pain and leg swelling.  Gastrointestinal: Negative.  Negative for abdominal pain.  Genitourinary: Negative.  Negative for dysuria.  Musculoskeletal: Negative.  Negative for back pain.  Skin: Negative.  Negative for rash.  Neurological: Negative.  Negative for speech change, weakness and headaches.  Psychiatric/Behavioral: Negative.  The patient is not nervous/anxious.     As per HPI. Otherwise, a complete review of systems is negative.  PAST MEDICAL HISTORY: Past Medical History:  Diagnosis Date  . ED (erectile dysfunction)   . GERD (gastroesophageal reflux disease)   . History of kidney stones 2013   s/p lithotripsy  . HLD (hyperlipidemia)   . HTN (hypertension)   . Obesity   . T2DM (type 2 diabetes mellitus) ([place])    DSME at [facility] 01/2012  . Tonsillar mass 05/23/2018    PAST  SURGICAL HISTORY: Past Surgical History:  Procedure Laterality Date  . CHOLECYSTECTOMY  2007  . COLONOSCOPY  11/2008   2 hyperplastic polyps [name])  . EXTRACORPOREAL SHOCK WAVE LITHOTRIPSY Left 04/28/2018   Procedure: EXTRACORPOREAL SHOCK WAVE LITHOTRIPSY (ESWL);  Surgeon: Billey Co, MD;  Location: [facility] ORS;  Service: Urology;  Laterality: Left;  . LITHOTRIPSY  2013   x 2    FAMILY HISTORY: Family History  Problem Relation Age of Onset  . Cancer Father 78       lung, smoker  . Aneurysm Mother 13       brain  . Coronary artery disease Neg Hx   . Stroke Neg Hx   . Diabetes Neg Hx     ADVANCED DIRECTIVES (Y/N):  N  HEALTH MAINTENANCE: Social History   Tobacco Use  . Smoking status: Former Smoker    Quit date: 02/10/2007    Years since quitting: 11.4  . Smokeless tobacco: Never Used  Substance Use Topics  . Alcohol use: Yes    Alcohol/week: 0.0 standard drinks    Comment: 5 drinks/week   . Drug use: Yes    Comment: rare MJ     Colonoscopy:  PAP:  Bone density:  Lipid panel:  Allergies  Allergen Reactions  . Oxycodone Nausea And Vomiting    Current Outpatient Medications  Medication Sig Dispense Refill  . aspirin 81 MG tablet Take 81 mg by mouth daily.    Marland Kitchen atorvastatin (LIPITOR) 40 MG tablet TAKE 1 TABLET BY MOUTH EVERY DAY 90 tablet 0  . Blood Glucose Monitoring Suppl (BAYER CONTOUR  MONITOR) w/Device KIT by Does not apply route. Use to check sugar daily and as needed Dx: E11.65    . docusate sodium (COLACE) 100 MG capsule Take 100 mg by mouth daily.    . fenofibrate 160 MG tablet TAKE 1 TABLET BY MOUTH EVERY DAY 90 tablet 0  . glucose blood test strip 1 each by Other route as needed for other. Use as instructed to check sugar once daily and as needed. E11.65 **Contour**    . Lancets MISC by Does not apply route. Use as directed to check sugar once daily and as needed. Dx. E11.65 **Contour**    . lisinopril (ZESTRIL) 10 MG tablet TAKE 1 TABLET BY MOUTH  EVERY DAY 90 tablet 0  . metFORMIN (GLUCOPHAGE) 500 MG tablet TAKE 1 TABLET BY MOUTH EVERY DAY 90 tablet 0  . Multiple Vitamin (MULTIVITAMIN) tablet Take 1 tablet by mouth daily.    . OMEGA-3 FATTY ACIDS PO Take 1 capsule by mouth daily.    Marland Kitchen omeprazole (PRILOSEC OTC) 20 MG tablet Take 20 mg by mouth daily.    . ondansetron (ZOFRAN) 8 MG tablet Take 1 tablet (8 mg total) by mouth 2 (two) times daily as needed. Start on the third day after chemotherapy. 30 tablet 1  . prochlorperazine (COMPAZINE) 10 MG tablet Take 1 tablet (10 mg total) by mouth every 6 (six) hours as needed (Nausea or vomiting). 60 tablet 1  . sildenafil (VIAGRA) 100 MG tablet Take 100 mg by mouth daily as needed for erectile dysfunction.    . sucralfate (CARAFATE) 1 g tablet Take 1 tablet (1 g total) by mouth 3 (three) times daily. Dissolve in 3-4 tbsp warm water, swish and swallow. 90 tablet 3  . tamsulosin (FLOMAX) 0.4 MG CAPS capsule Take 1 capsule (0.4 mg total) by mouth daily after supper. 30 capsule 0   No current facility-administered medications for this visit.     OBJECTIVE: Vitals:   07/26/18 0850  BP: 116/75  Pulse: 76  Resp: 18     Body mass index is 30.82 kg/m.    ECOG FS:0 - Asymptomatic  General: Well-developed, well-nourished, no acute distress. Eyes: Pink conjunctiva, anicteric sclera. HEENT: Normocephalic, moist mucous membranes, enlarged right tonsil.  No palpable lymphadenopathy. Lungs: Clear to auscultation bilaterally. Heart: Regular rate and rhythm. No rubs, murmurs, or gallops. Abdomen: Soft, nontender, nondistended. No organomegaly noted, normoactive bowel sounds. Musculoskeletal: No edema, cyanosis, or clubbing. Neuro: Alert, answering all questions appropriately. Cranial nerves grossly intact. Skin: No rashes or petechiae noted. Psych: Normal affect.  LAB RESULTS:  Lab Results  Component Value Date   NA 137 07/26/2018   K 4.0 07/26/2018   CL 105 07/26/2018   CO2 25 07/26/2018    GLUCOSE 143 (H) 07/26/2018   BUN 28 (H) 07/26/2018   CREATININE 0.99 07/26/2018   CALCIUM 9.1 07/26/2018   PROT 6.5 05/27/2017   ALBUMIN 4.3 05/27/2017   AST 19 05/27/2017   ALT 18 05/27/2017   ALKPHOS 47 05/27/2017   BILITOT 0.8 05/27/2017   GFRNONAA >60 07/26/2018   GFRAA >60 07/26/2018    Lab Results  Component Value Date   WBC 3.2 (L) 07/26/2018   NEUTROABS 2.5 07/26/2018   HGB 12.1 (L) 07/26/2018   HCT 34.3 (L) 07/26/2018   MCV 88.9 07/26/2018   PLT 90 (L) 07/26/2018     STUDIES: No results found.  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 Stage I squamous  cell cancer of the right tonsil, p16+: PET scan and pathology results reviewed independently confirming diagnosis and stage of disease.  Patient was also discussed recently at tumor board.  He will benefit from concurrent chemotherapy using weekly cisplatin along with daily XRT. He does not require port placement at this time, but will consider one later if his IV access becomes difficult.  Continue daily XRT completing on August 18, 2018.  Delay cycle 5 of weekly cisplatin secondary to thrombocytopenia.  Return to clinic in 1 week for further evaluation and reconsideration of cycle 5.     2.  Dysphasia: Chronic and unchanged.  Continue Carafate and Magic mouthwash as prescribed.   3.  Hyperglycemia: Improved control, monitor. 4.  Renal insufficiency: Patient's creatinine is now within normal limits. 5.  Thrombocytopenia: Secondary to chemotherapy.  Delay treatment 1 week as above.  Patient expressed understanding and was in agreement with this plan. He also understands that He can call clinic at any time with any questions, concerns, or complaints.   Cancer Staging Squamous cell carcinoma of right tonsil ([place]) Staging form: Pharynx - HPV-Mediated Oropharynx, AJCC 8th Edition - Clinical stage from 06/12/2018: Stage I (cT2, cN1, cM0, p16+) - Signed by Lloyd Huger, MD on 06/12/2018    Lloyd Huger, MD   07/26/2018 9:55 AM

## 2018-07-25 ENCOUNTER — Other Ambulatory Visit: Payer: Self-pay | Admitting: Family Medicine

## 2018-07-25 ENCOUNTER — Ambulatory Visit
Admission: RE | Admit: 2018-07-25 | Discharge: 2018-07-25 | Disposition: A | Discharge status: Home / Self Care | Payer: BC Managed Care – PPO | Source: Ambulatory Visit | Attending: Radiation Oncology | Admitting: Radiation Oncology

## 2018-07-25 ENCOUNTER — Other Ambulatory Visit: Payer: Self-pay

## 2018-07-25 DIAGNOSIS — C099 Malignant neoplasm of tonsil, unspecified: Secondary | ICD-10-CM | POA: Diagnosis not present

## 2018-07-25 DIAGNOSIS — Z7982 Long term (current) use of aspirin: Secondary | ICD-10-CM | POA: Diagnosis not present

## 2018-07-25 DIAGNOSIS — N529 Male erectile dysfunction, unspecified: Secondary | ICD-10-CM | POA: Diagnosis not present

## 2018-07-25 DIAGNOSIS — Z809 Family history of malignant neoplasm, unspecified: Secondary | ICD-10-CM | POA: Diagnosis not present

## 2018-07-25 DIAGNOSIS — K219 Gastro-esophageal reflux disease without esophagitis: Secondary | ICD-10-CM | POA: Diagnosis not present

## 2018-07-25 DIAGNOSIS — Z87442 Personal history of urinary calculi: Secondary | ICD-10-CM | POA: Diagnosis not present

## 2018-07-25 DIAGNOSIS — Z7984 Long term (current) use of oral hypoglycemic drugs: Secondary | ICD-10-CM | POA: Diagnosis not present

## 2018-07-25 DIAGNOSIS — Z79899 Other long term (current) drug therapy: Secondary | ICD-10-CM | POA: Diagnosis not present

## 2018-07-25 DIAGNOSIS — E669 Obesity, unspecified: Secondary | ICD-10-CM | POA: Diagnosis not present

## 2018-07-25 DIAGNOSIS — E119 Type 2 diabetes mellitus without complications: Secondary | ICD-10-CM | POA: Diagnosis not present

## 2018-07-25 DIAGNOSIS — E785 Hyperlipidemia, unspecified: Secondary | ICD-10-CM | POA: Diagnosis not present

## 2018-07-25 DIAGNOSIS — J439 Emphysema, unspecified: Secondary | ICD-10-CM | POA: Diagnosis not present

## 2018-07-25 DIAGNOSIS — I10 Essential (primary) hypertension: Secondary | ICD-10-CM | POA: Diagnosis not present

## 2018-07-25 DIAGNOSIS — Z5111 Encounter for antineoplastic chemotherapy: Secondary | ICD-10-CM | POA: Diagnosis not present

## 2018-07-25 DIAGNOSIS — Z87891 Personal history of nicotine dependence: Secondary | ICD-10-CM | POA: Diagnosis not present

## 2018-07-25 DIAGNOSIS — R590 Localized enlarged lymph nodes: Secondary | ICD-10-CM | POA: Diagnosis not present

## 2018-07-26 ENCOUNTER — Inpatient Hospital Stay (HOSPITAL_BASED_OUTPATIENT_CLINIC_OR_DEPARTMENT_OTHER): Payer: BC Managed Care – PPO | Admitting: Oncology

## 2018-07-26 ENCOUNTER — Other Ambulatory Visit: Payer: Self-pay

## 2018-07-26 ENCOUNTER — Inpatient Hospital Stay: Payer: BC Managed Care – PPO

## 2018-07-26 ENCOUNTER — Encounter: Payer: Self-pay | Admitting: Oncology

## 2018-07-26 ENCOUNTER — Ambulatory Visit
Admission: RE | Admit: 2018-07-26 | Discharge: 2018-07-26 | Disposition: A | Discharge status: Home / Self Care | Payer: BC Managed Care – PPO | Source: Ambulatory Visit | Attending: Radiation Oncology | Admitting: Radiation Oncology

## 2018-07-26 VITALS — BP 116/75 | HR 76 | Resp 18 | Wt 221.0 lb

## 2018-07-26 DIAGNOSIS — E119 Type 2 diabetes mellitus without complications: Secondary | ICD-10-CM | POA: Diagnosis not present

## 2018-07-26 DIAGNOSIS — C099 Malignant neoplasm of tonsil, unspecified: Secondary | ICD-10-CM | POA: Diagnosis not present

## 2018-07-26 DIAGNOSIS — E1165 Type 2 diabetes mellitus with hyperglycemia: Secondary | ICD-10-CM | POA: Diagnosis not present

## 2018-07-26 DIAGNOSIS — R4702 Dysphasia: Secondary | ICD-10-CM

## 2018-07-26 DIAGNOSIS — E785 Hyperlipidemia, unspecified: Secondary | ICD-10-CM | POA: Diagnosis not present

## 2018-07-26 DIAGNOSIS — Z87442 Personal history of urinary calculi: Secondary | ICD-10-CM | POA: Diagnosis not present

## 2018-07-26 DIAGNOSIS — K219 Gastro-esophageal reflux disease without esophagitis: Secondary | ICD-10-CM | POA: Diagnosis not present

## 2018-07-26 DIAGNOSIS — I10 Essential (primary) hypertension: Secondary | ICD-10-CM

## 2018-07-26 DIAGNOSIS — N289 Disorder of kidney and ureter, unspecified: Secondary | ICD-10-CM

## 2018-07-26 DIAGNOSIS — R590 Localized enlarged lymph nodes: Secondary | ICD-10-CM | POA: Diagnosis not present

## 2018-07-26 DIAGNOSIS — J439 Emphysema, unspecified: Secondary | ICD-10-CM | POA: Diagnosis not present

## 2018-07-26 DIAGNOSIS — N529 Male erectile dysfunction, unspecified: Secondary | ICD-10-CM | POA: Diagnosis not present

## 2018-07-26 DIAGNOSIS — Z7984 Long term (current) use of oral hypoglycemic drugs: Secondary | ICD-10-CM | POA: Diagnosis not present

## 2018-07-26 DIAGNOSIS — Z809 Family history of malignant neoplasm, unspecified: Secondary | ICD-10-CM | POA: Diagnosis not present

## 2018-07-26 DIAGNOSIS — Z79899 Other long term (current) drug therapy: Secondary | ICD-10-CM | POA: Diagnosis not present

## 2018-07-26 DIAGNOSIS — Z7982 Long term (current) use of aspirin: Secondary | ICD-10-CM | POA: Diagnosis not present

## 2018-07-26 DIAGNOSIS — D709 Neutropenia, unspecified: Secondary | ICD-10-CM | POA: Diagnosis not present

## 2018-07-26 DIAGNOSIS — D6959 Other secondary thrombocytopenia: Secondary | ICD-10-CM | POA: Diagnosis not present

## 2018-07-26 DIAGNOSIS — Z87891 Personal history of nicotine dependence: Secondary | ICD-10-CM | POA: Diagnosis not present

## 2018-07-26 DIAGNOSIS — Z5111 Encounter for antineoplastic chemotherapy: Secondary | ICD-10-CM | POA: Diagnosis not present

## 2018-07-26 DIAGNOSIS — E669 Obesity, unspecified: Secondary | ICD-10-CM | POA: Diagnosis not present

## 2018-07-26 LAB — CBC WITH DIFFERENTIAL/PLATELET
Abs Immature Granulocytes: 0.01 10*3/uL (ref 0.00–0.07)
Basophils Absolute: 0 10*3/uL (ref 0.0–0.1)
Basophils Relative: 1 %
Eosinophils Absolute: 0.1 10*3/uL (ref 0.0–0.5)
Eosinophils Relative: 2 %
HCT: 34.3 % — ABNORMAL LOW (ref 39.0–52.0)
Hemoglobin: 12.1 g/dL — ABNORMAL LOW (ref 13.0–17.0)
Immature Granulocytes: 0 %
Lymphocytes Relative: 15 %
Lymphs Abs: 0.5 10*3/uL — ABNORMAL LOW (ref 0.7–4.0)
MCH: 31.3 pg (ref 26.0–34.0)
MCHC: 35.3 g/dL (ref 30.0–36.0)
MCV: 88.9 fL (ref 80.0–100.0)
Monocytes Absolute: 0.2 10*3/uL (ref 0.1–1.0)
Monocytes Relative: 6 %
Neutro Abs: 2.5 10*3/uL (ref 1.7–7.7)
Neutrophils Relative %: 76 %
Platelets: 90 10*3/uL — ABNORMAL LOW (ref 150–400)
RBC: 3.86 MIL/uL — ABNORMAL LOW (ref 4.22–5.81)
RDW: 12.1 % (ref 11.5–15.5)
WBC: 3.2 10*3/uL — ABNORMAL LOW (ref 4.0–10.5)
nRBC: 0 % (ref 0.0–0.2)

## 2018-07-26 LAB — BASIC METABOLIC PANEL
Anion gap: 7 (ref 5–15)
BUN: 28 mg/dL — ABNORMAL HIGH (ref 8–23)
CO2: 25 mmol/L (ref 22–32)
Calcium: 9.1 mg/dL (ref 8.9–10.3)
Chloride: 105 mmol/L (ref 98–111)
Creatinine, Ser: 0.99 mg/dL (ref 0.61–1.24)
GFR calc Af Amer: >60 mL/min (ref >60)
GFR calc non Af Amer: >60 mL/min (ref >60)
Glucose, Bld: 143 mg/dL — ABNORMAL HIGH (ref 70–99)
Potassium: 4 mmol/L (ref 3.5–5.1)
Sodium: 137 mmol/L (ref 135–145)

## 2018-07-26 NOTE — Progress Notes (Signed)
Patient here today for follow up regarding head and neck cancer. Patient reports decreased appetite, some nausea.

## 2018-07-27 ENCOUNTER — Ambulatory Visit
Admission: RE | Admit: 2018-07-27 | Discharge: 2018-07-27 | Disposition: A | Discharge status: Home / Self Care | Payer: BC Managed Care – PPO | Source: Ambulatory Visit | Attending: Radiation Oncology | Admitting: Radiation Oncology

## 2018-07-27 DIAGNOSIS — Z87891 Personal history of nicotine dependence: Secondary | ICD-10-CM | POA: Diagnosis not present

## 2018-07-27 DIAGNOSIS — Z7984 Long term (current) use of oral hypoglycemic drugs: Secondary | ICD-10-CM | POA: Diagnosis not present

## 2018-07-27 DIAGNOSIS — E119 Type 2 diabetes mellitus without complications: Secondary | ICD-10-CM | POA: Diagnosis not present

## 2018-07-27 DIAGNOSIS — R590 Localized enlarged lymph nodes: Secondary | ICD-10-CM | POA: Diagnosis not present

## 2018-07-27 DIAGNOSIS — J439 Emphysema, unspecified: Secondary | ICD-10-CM | POA: Diagnosis not present

## 2018-07-27 DIAGNOSIS — E785 Hyperlipidemia, unspecified: Secondary | ICD-10-CM | POA: Diagnosis not present

## 2018-07-27 DIAGNOSIS — Z87442 Personal history of urinary calculi: Secondary | ICD-10-CM | POA: Diagnosis not present

## 2018-07-27 DIAGNOSIS — N529 Male erectile dysfunction, unspecified: Secondary | ICD-10-CM | POA: Diagnosis not present

## 2018-07-27 DIAGNOSIS — Z809 Family history of malignant neoplasm, unspecified: Secondary | ICD-10-CM | POA: Diagnosis not present

## 2018-07-27 DIAGNOSIS — Z7982 Long term (current) use of aspirin: Secondary | ICD-10-CM | POA: Diagnosis not present

## 2018-07-27 DIAGNOSIS — C099 Malignant neoplasm of tonsil, unspecified: Secondary | ICD-10-CM | POA: Diagnosis not present

## 2018-07-27 DIAGNOSIS — Z5111 Encounter for antineoplastic chemotherapy: Secondary | ICD-10-CM | POA: Diagnosis not present

## 2018-07-27 DIAGNOSIS — E669 Obesity, unspecified: Secondary | ICD-10-CM | POA: Diagnosis not present

## 2018-07-27 DIAGNOSIS — K219 Gastro-esophageal reflux disease without esophagitis: Secondary | ICD-10-CM | POA: Diagnosis not present

## 2018-07-27 DIAGNOSIS — Z79899 Other long term (current) drug therapy: Secondary | ICD-10-CM | POA: Diagnosis not present

## 2018-07-27 DIAGNOSIS — I10 Essential (primary) hypertension: Secondary | ICD-10-CM | POA: Diagnosis not present

## 2018-07-28 ENCOUNTER — Other Ambulatory Visit: Payer: Self-pay

## 2018-07-28 ENCOUNTER — Ambulatory Visit
Admission: RE | Admit: 2018-07-28 | Discharge: 2018-07-28 | Disposition: A | Discharge status: Home / Self Care | Payer: BC Managed Care – PPO | Source: Ambulatory Visit | Attending: Radiation Oncology | Admitting: Radiation Oncology

## 2018-07-28 DIAGNOSIS — K219 Gastro-esophageal reflux disease without esophagitis: Secondary | ICD-10-CM | POA: Diagnosis not present

## 2018-07-28 DIAGNOSIS — E119 Type 2 diabetes mellitus without complications: Secondary | ICD-10-CM | POA: Diagnosis not present

## 2018-07-28 DIAGNOSIS — Z7984 Long term (current) use of oral hypoglycemic drugs: Secondary | ICD-10-CM | POA: Diagnosis not present

## 2018-07-28 DIAGNOSIS — Z87891 Personal history of nicotine dependence: Secondary | ICD-10-CM | POA: Diagnosis not present

## 2018-07-28 DIAGNOSIS — J439 Emphysema, unspecified: Secondary | ICD-10-CM | POA: Diagnosis not present

## 2018-07-28 DIAGNOSIS — E669 Obesity, unspecified: Secondary | ICD-10-CM | POA: Diagnosis not present

## 2018-07-28 DIAGNOSIS — Z809 Family history of malignant neoplasm, unspecified: Secondary | ICD-10-CM | POA: Diagnosis not present

## 2018-07-28 DIAGNOSIS — Z5111 Encounter for antineoplastic chemotherapy: Secondary | ICD-10-CM | POA: Diagnosis not present

## 2018-07-28 DIAGNOSIS — E785 Hyperlipidemia, unspecified: Secondary | ICD-10-CM | POA: Diagnosis not present

## 2018-07-28 DIAGNOSIS — N529 Male erectile dysfunction, unspecified: Secondary | ICD-10-CM | POA: Diagnosis not present

## 2018-07-28 DIAGNOSIS — Z7982 Long term (current) use of aspirin: Secondary | ICD-10-CM | POA: Diagnosis not present

## 2018-07-28 DIAGNOSIS — I10 Essential (primary) hypertension: Secondary | ICD-10-CM | POA: Diagnosis not present

## 2018-07-28 DIAGNOSIS — Z79899 Other long term (current) drug therapy: Secondary | ICD-10-CM | POA: Diagnosis not present

## 2018-07-28 DIAGNOSIS — R590 Localized enlarged lymph nodes: Secondary | ICD-10-CM | POA: Diagnosis not present

## 2018-07-28 DIAGNOSIS — Z87442 Personal history of urinary calculi: Secondary | ICD-10-CM | POA: Diagnosis not present

## 2018-07-28 DIAGNOSIS — C099 Malignant neoplasm of tonsil, unspecified: Secondary | ICD-10-CM | POA: Diagnosis not present

## 2018-07-29 ENCOUNTER — Ambulatory Visit
Admission: RE | Admit: 2018-07-29 | Discharge: 2018-07-29 | Disposition: A | Discharge status: Home / Self Care | Payer: BC Managed Care – PPO | Source: Ambulatory Visit | Attending: Radiation Oncology | Admitting: Radiation Oncology

## 2018-07-29 ENCOUNTER — Other Ambulatory Visit: Payer: Self-pay

## 2018-07-29 ENCOUNTER — Telehealth: Payer: Self-pay | Admitting: Urology

## 2018-07-29 DIAGNOSIS — J439 Emphysema, unspecified: Secondary | ICD-10-CM | POA: Diagnosis not present

## 2018-07-29 DIAGNOSIS — E119 Type 2 diabetes mellitus without complications: Secondary | ICD-10-CM | POA: Diagnosis not present

## 2018-07-29 DIAGNOSIS — K219 Gastro-esophageal reflux disease without esophagitis: Secondary | ICD-10-CM | POA: Diagnosis not present

## 2018-07-29 DIAGNOSIS — Z809 Family history of malignant neoplasm, unspecified: Secondary | ICD-10-CM | POA: Diagnosis not present

## 2018-07-29 DIAGNOSIS — R590 Localized enlarged lymph nodes: Secondary | ICD-10-CM | POA: Diagnosis not present

## 2018-07-29 DIAGNOSIS — N529 Male erectile dysfunction, unspecified: Secondary | ICD-10-CM | POA: Diagnosis not present

## 2018-07-29 DIAGNOSIS — E785 Hyperlipidemia, unspecified: Secondary | ICD-10-CM | POA: Diagnosis not present

## 2018-07-29 DIAGNOSIS — Z5111 Encounter for antineoplastic chemotherapy: Secondary | ICD-10-CM | POA: Diagnosis not present

## 2018-07-29 DIAGNOSIS — Z87891 Personal history of nicotine dependence: Secondary | ICD-10-CM | POA: Diagnosis not present

## 2018-07-29 DIAGNOSIS — Z7984 Long term (current) use of oral hypoglycemic drugs: Secondary | ICD-10-CM | POA: Diagnosis not present

## 2018-07-29 DIAGNOSIS — C099 Malignant neoplasm of tonsil, unspecified: Secondary | ICD-10-CM | POA: Diagnosis not present

## 2018-07-29 DIAGNOSIS — Z7982 Long term (current) use of aspirin: Secondary | ICD-10-CM | POA: Diagnosis not present

## 2018-07-29 DIAGNOSIS — Z79899 Other long term (current) drug therapy: Secondary | ICD-10-CM | POA: Diagnosis not present

## 2018-07-29 DIAGNOSIS — E669 Obesity, unspecified: Secondary | ICD-10-CM | POA: Diagnosis not present

## 2018-07-29 DIAGNOSIS — Z87442 Personal history of urinary calculi: Secondary | ICD-10-CM | POA: Diagnosis not present

## 2018-07-29 DIAGNOSIS — I10 Essential (primary) hypertension: Secondary | ICD-10-CM | POA: Diagnosis not present

## 2018-07-29 NOTE — Telephone Encounter (Signed)
That's fine. Just change to 9-12 months with renal ultrasound  William Madrid, MD 07/29/2018

## 2018-07-29 NOTE — Telephone Encounter (Signed)
Patient has a follow up app with you on 08-18-18 with RUS prior he does not want to schedule that because he stated he wants to concentrate on his cancer right now.  Do you still want him to keep the app for follow up? Please advise   Sharyn Lull

## 2018-07-30 NOTE — Progress Notes (Signed)
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  INTERVAL HISTORY: Patient returns to clinic today for further evaluation and reconsideration of cycle 5 of weekly cisplatin.  He has some mild dysphasia, but otherwise is tolerating his treatments well. He denies any weakness or fatigue. He has no neurologic complaints.  He denies any recent fevers or illnesses. He has no chest pain, shortness of breath, cough, or hemoptysis.  He has no nausea, vomiting, constipation, or diarrhea.  He has no urinary complaints.  Patient offers no further specific complaints today.  REVIEW OF SYSTEMS:   Review of Systems  Constitutional: Negative.  Negative for fever, malaise/fatigue and weight loss.  HENT: Positive for sore throat.   Respiratory: Negative.  Negative for cough and shortness of breath.   Cardiovascular: Negative.  Negative for chest pain and leg swelling.  Gastrointestinal: Negative.  Negative for abdominal pain.  Genitourinary: Negative.  Negative for dysuria.  Musculoskeletal: Negative.  Negative for back pain.  Skin: Negative.  Negative for rash.  Neurological: Negative.  Negative for speech change, weakness and headaches.  Psychiatric/Behavioral: Negative.  The patient is not nervous/anxious.     As per HPI. Otherwise, a complete review of systems is negative.  PAST MEDICAL HISTORY: Past Medical History:  Diagnosis Date  . ED (erectile dysfunction)   . GERD (gastroesophageal reflux disease)   . History of kidney stones 2013   s/p lithotripsy  . HLD (hyperlipidemia)   . HTN (hypertension)   . Obesity   . T2DM (type 2 diabetes mellitus) ([place])    DSME at [facility] 01/2012  . Tonsillar mass 05/23/2018    PAST SURGICAL HISTORY: Past Surgical  History:  Procedure Laterality Date  . CHOLECYSTECTOMY  2007  . COLONOSCOPY  11/2008   2 hyperplastic polyps [name])  . EXTRACORPOREAL SHOCK WAVE LITHOTRIPSY Left 04/28/2018   Procedure: EXTRACORPOREAL SHOCK WAVE LITHOTRIPSY (ESWL);  Surgeon: Billey Co, MD;  Location: [facility] ORS;  Service: Urology;  Laterality: Left;  . LITHOTRIPSY  2013   x 2    FAMILY HISTORY: Family History  Problem Relation Age of Onset  . Cancer Father 53       lung, smoker  . Aneurysm Mother 69       brain  . Coronary artery disease Neg Hx   . Stroke Neg Hx   . Diabetes Neg Hx     ADVANCED DIRECTIVES (Y/N):  N  HEALTH MAINTENANCE: Social History   Tobacco Use  . Smoking status: Former Smoker    Quit date: 02/10/2007    Years since quitting: 11.4  . Smokeless tobacco: Never Used  Substance Use Topics  . Alcohol use: Yes    Alcohol/week: 0.0 standard drinks    Comment: 5 drinks/week   . Drug use: Yes    Comment: rare MJ     Colonoscopy:  PAP:  Bone density:  Lipid panel:  Allergies  Allergen Reactions  . Oxycodone Nausea And Vomiting    Current Outpatient Medications  Medication Sig Dispense Refill  . aspirin 81 MG tablet Take 81 mg by mouth daily.    Marland Kitchen atorvastatin (LIPITOR) 40 MG tablet TAKE 1 TABLET BY MOUTH EVERY DAY 90 tablet 0  . Blood Glucose Monitoring Suppl (BAYER CONTOUR MONITOR) w/Device KIT by Does not apply  route. Use to check sugar daily and as needed Dx: E11.65    . docusate sodium (COLACE) 100 MG capsule Take 100 mg by mouth daily.    . fenofibrate 160 MG tablet TAKE 1 TABLET BY MOUTH EVERY DAY 90 tablet 0  . glucose blood test strip 1 each by Other route as needed for other. Use as instructed to check sugar once daily and as needed. E11.65 **Contour**    . Lancets MISC by Does not apply route. Use as directed to check sugar once daily and as needed. Dx. E11.65 **Contour**    . lisinopril (ZESTRIL) 10 MG tablet TAKE 1 TABLET BY MOUTH EVERY DAY 90 tablet 0  .  metFORMIN (GLUCOPHAGE) 500 MG tablet TAKE 1 TABLET BY MOUTH EVERY DAY 90 tablet 0  . Multiple Vitamin (MULTIVITAMIN) tablet Take 1 tablet by mouth daily.    . OMEGA-3 FATTY ACIDS PO Take 1 capsule by mouth daily.    Marland Kitchen omeprazole (PRILOSEC OTC) 20 MG tablet Take 20 mg by mouth daily.    . ondansetron (ZOFRAN) 8 MG tablet Take 1 tablet (8 mg total) by mouth 2 (two) times daily as needed. Start on the third day after chemotherapy. 30 tablet 1  . prochlorperazine (COMPAZINE) 10 MG tablet Take 1 tablet (10 mg total) by mouth every 6 (six) hours as needed (Nausea or vomiting). 60 tablet 1  . sildenafil (VIAGRA) 100 MG tablet Take 100 mg by mouth daily as needed for erectile dysfunction.    . sucralfate (CARAFATE) 1 g tablet Take 1 tablet (1 g total) by mouth 3 (three) times daily. Dissolve in 3-4 tbsp warm water, swish and swallow. 90 tablet 3  . tamsulosin (FLOMAX) 0.4 MG CAPS capsule Take 1 capsule (0.4 mg total) by mouth daily after supper. 30 capsule 0   No current facility-administered medications for this visit.     OBJECTIVE: Vitals:   08/02/18 0929  BP: 115/68  Pulse: 80  Temp: (!) 97.3 F (36.3 C)     Body mass index is 29.93 kg/m.    ECOG FS:0 - Asymptomatic  General: Well-developed, well-nourished, no acute distress. Eyes: Pink conjunctiva, anicteric sclera. HEENT: Normocephalic, moist mucous membranes, clear oropharnyx.  No palpable lymphadenopathy. Lungs: Clear to auscultation bilaterally. Heart: Regular rate and rhythm. No rubs, murmurs, or gallops. Abdomen: Soft, nontender, nondistended. No organomegaly noted, normoactive bowel sounds. Musculoskeletal: No edema, cyanosis, or clubbing. Neuro: Alert, answering all questions appropriately. Cranial nerves grossly intact. Skin: No rashes or petechiae noted. Psych: Normal affect.  LAB RESULTS:  Lab Results  Component Value Date   NA 138 08/02/2018   K 3.9 08/02/2018   CL 105 08/02/2018   CO2 23 08/02/2018   GLUCOSE 109  (H) 08/02/2018   BUN 24 (H) 08/02/2018   CREATININE 1.20 08/02/2018   CALCIUM 9.1 08/02/2018   PROT 6.5 05/27/2017   ALBUMIN 4.3 05/27/2017   AST 19 05/27/2017   ALT 18 05/27/2017   ALKPHOS 47 05/27/2017   BILITOT 0.8 05/27/2017   GFRNONAA >60 08/02/2018   GFRAA >60 08/02/2018    Lab Results  Component Value Date   WBC 2.2 (L) 08/02/2018   NEUTROABS 1.6 (L) 08/02/2018   HGB 11.0 (L) 08/02/2018   HCT 32.0 (L) 08/02/2018   MCV 90.9 08/02/2018   PLT 106 (L) 08/02/2018     STUDIES: No results found.  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 Stage I squamous cell cancer of  the right tonsil, p16+: PET scan and pathology results reviewed independently confirming diagnosis and stage of disease.  Patient was also discussed recently at tumor board.  He will benefit from concurrent chemotherapy using weekly cisplatin along with daily XRT. He does not require port placement at this time, but will consider one later if his IV access becomes difficult.  Continue daily XRT completing on August 18, 2018.  Proceed with cycle 5 of weekly cisplatin today.  Return to clinic in 1 week for further evaluation and consideration of cycle 6.   2.  Dysphasia: Chronic and unchanged.  Continue Carafate and Magic mouthwash as prescribed.   3.  Hyperglycemia: Improved control, monitor. 4.  Renal insufficiency: Patient's creatinine is now within normal limits. 5.  Thrombocytopenia: Improved. Proceed with treatment as above. 6.  Neutropenia: Monitor.  Proceed with treatment as above.  Patient expressed understanding and was in agreement with this plan. He also understands that He can call clinic at any time with any questions, concerns, or complaints.   Cancer Staging Squamous cell carcinoma of right tonsil ([place]) Staging form: Pharynx - HPV-Mediated Oropharynx, AJCC 8th Edition - Clinical stage from 06/12/2018: Stage I (cT2, cN1, cM0, p16+) - Signed by Lloyd Huger,  MD on 06/12/2018   Lloyd Huger, MD   08/03/2018 6:46 AM

## 2018-08-01 ENCOUNTER — Other Ambulatory Visit: Payer: Self-pay

## 2018-08-01 ENCOUNTER — Ambulatory Visit
Admission: RE | Admit: 2018-08-01 | Discharge: 2018-08-01 | Disposition: A | Discharge status: Home / Self Care | Payer: BC Managed Care – PPO | Source: Ambulatory Visit | Attending: Radiation Oncology | Admitting: Radiation Oncology

## 2018-08-01 DIAGNOSIS — E119 Type 2 diabetes mellitus without complications: Secondary | ICD-10-CM | POA: Diagnosis not present

## 2018-08-01 DIAGNOSIS — C099 Malignant neoplasm of tonsil, unspecified: Secondary | ICD-10-CM | POA: Diagnosis not present

## 2018-08-01 DIAGNOSIS — Z5111 Encounter for antineoplastic chemotherapy: Secondary | ICD-10-CM | POA: Diagnosis not present

## 2018-08-01 DIAGNOSIS — J439 Emphysema, unspecified: Secondary | ICD-10-CM | POA: Diagnosis not present

## 2018-08-01 DIAGNOSIS — Z7984 Long term (current) use of oral hypoglycemic drugs: Secondary | ICD-10-CM | POA: Diagnosis not present

## 2018-08-01 DIAGNOSIS — Z7982 Long term (current) use of aspirin: Secondary | ICD-10-CM | POA: Diagnosis not present

## 2018-08-01 DIAGNOSIS — R590 Localized enlarged lymph nodes: Secondary | ICD-10-CM | POA: Diagnosis not present

## 2018-08-01 DIAGNOSIS — Z809 Family history of malignant neoplasm, unspecified: Secondary | ICD-10-CM | POA: Diagnosis not present

## 2018-08-01 DIAGNOSIS — E669 Obesity, unspecified: Secondary | ICD-10-CM | POA: Diagnosis not present

## 2018-08-01 DIAGNOSIS — Z87442 Personal history of urinary calculi: Secondary | ICD-10-CM | POA: Diagnosis not present

## 2018-08-01 DIAGNOSIS — Z79899 Other long term (current) drug therapy: Secondary | ICD-10-CM | POA: Diagnosis not present

## 2018-08-01 DIAGNOSIS — Z87891 Personal history of nicotine dependence: Secondary | ICD-10-CM | POA: Diagnosis not present

## 2018-08-01 DIAGNOSIS — K219 Gastro-esophageal reflux disease without esophagitis: Secondary | ICD-10-CM | POA: Diagnosis not present

## 2018-08-01 DIAGNOSIS — E785 Hyperlipidemia, unspecified: Secondary | ICD-10-CM | POA: Diagnosis not present

## 2018-08-01 DIAGNOSIS — N529 Male erectile dysfunction, unspecified: Secondary | ICD-10-CM | POA: Diagnosis not present

## 2018-08-01 DIAGNOSIS — I10 Essential (primary) hypertension: Secondary | ICD-10-CM | POA: Diagnosis not present

## 2018-08-01 NOTE — Telephone Encounter (Signed)
Patient stated he will call us back when he is ready to reschd Declined to schedule at this time  [facility]

## 2018-08-02 ENCOUNTER — Ambulatory Visit
Admission: RE | Admit: 2018-08-02 | Discharge: 2018-08-02 | Disposition: A | Discharge status: Home / Self Care | Payer: BC Managed Care – PPO | Source: Ambulatory Visit | Attending: Radiation Oncology | Admitting: Radiation Oncology

## 2018-08-02 ENCOUNTER — Inpatient Hospital Stay: Payer: BC Managed Care – PPO

## 2018-08-02 ENCOUNTER — Other Ambulatory Visit: Payer: Self-pay

## 2018-08-02 ENCOUNTER — Encounter: Payer: Self-pay | Admitting: Oncology

## 2018-08-02 ENCOUNTER — Inpatient Hospital Stay (HOSPITAL_BASED_OUTPATIENT_CLINIC_OR_DEPARTMENT_OTHER): Payer: BC Managed Care – PPO | Admitting: Oncology

## 2018-08-02 VITALS — BP 115/68 | HR 80 | Temp 97.3°F | Ht 71.0 in | Wt 214.6 lb

## 2018-08-02 DIAGNOSIS — C099 Malignant neoplasm of tonsil, unspecified: Secondary | ICD-10-CM

## 2018-08-02 DIAGNOSIS — K219 Gastro-esophageal reflux disease without esophagitis: Secondary | ICD-10-CM | POA: Diagnosis not present

## 2018-08-02 DIAGNOSIS — D709 Neutropenia, unspecified: Secondary | ICD-10-CM | POA: Diagnosis not present

## 2018-08-02 DIAGNOSIS — Z7984 Long term (current) use of oral hypoglycemic drugs: Secondary | ICD-10-CM | POA: Diagnosis not present

## 2018-08-02 DIAGNOSIS — Z7982 Long term (current) use of aspirin: Secondary | ICD-10-CM | POA: Diagnosis not present

## 2018-08-02 DIAGNOSIS — I10 Essential (primary) hypertension: Secondary | ICD-10-CM

## 2018-08-02 DIAGNOSIS — Z87442 Personal history of urinary calculi: Secondary | ICD-10-CM | POA: Diagnosis not present

## 2018-08-02 DIAGNOSIS — R4702 Dysphasia: Secondary | ICD-10-CM | POA: Diagnosis not present

## 2018-08-02 DIAGNOSIS — D6959 Other secondary thrombocytopenia: Secondary | ICD-10-CM | POA: Diagnosis not present

## 2018-08-02 DIAGNOSIS — E669 Obesity, unspecified: Secondary | ICD-10-CM | POA: Diagnosis not present

## 2018-08-02 DIAGNOSIS — Z87891 Personal history of nicotine dependence: Secondary | ICD-10-CM | POA: Diagnosis not present

## 2018-08-02 DIAGNOSIS — E1165 Type 2 diabetes mellitus with hyperglycemia: Secondary | ICD-10-CM

## 2018-08-02 DIAGNOSIS — Z5111 Encounter for antineoplastic chemotherapy: Secondary | ICD-10-CM | POA: Diagnosis not present

## 2018-08-02 DIAGNOSIS — N289 Disorder of kidney and ureter, unspecified: Secondary | ICD-10-CM

## 2018-08-02 DIAGNOSIS — R590 Localized enlarged lymph nodes: Secondary | ICD-10-CM | POA: Diagnosis not present

## 2018-08-02 DIAGNOSIS — J439 Emphysema, unspecified: Secondary | ICD-10-CM | POA: Diagnosis not present

## 2018-08-02 DIAGNOSIS — N529 Male erectile dysfunction, unspecified: Secondary | ICD-10-CM | POA: Diagnosis not present

## 2018-08-02 DIAGNOSIS — Z79899 Other long term (current) drug therapy: Secondary | ICD-10-CM | POA: Diagnosis not present

## 2018-08-02 DIAGNOSIS — E785 Hyperlipidemia, unspecified: Secondary | ICD-10-CM | POA: Diagnosis not present

## 2018-08-02 DIAGNOSIS — E119 Type 2 diabetes mellitus without complications: Secondary | ICD-10-CM | POA: Diagnosis not present

## 2018-08-02 DIAGNOSIS — Z809 Family history of malignant neoplasm, unspecified: Secondary | ICD-10-CM | POA: Diagnosis not present

## 2018-08-02 LAB — BASIC METABOLIC PANEL
Anion gap: 10 (ref 5–15)
BUN: 24 mg/dL — ABNORMAL HIGH (ref 8–23)
CO2: 23 mmol/L (ref 22–32)
Calcium: 9.1 mg/dL (ref 8.9–10.3)
Chloride: 105 mmol/L (ref 98–111)
Creatinine, Ser: 1.2 mg/dL (ref 0.61–1.24)
GFR calc Af Amer: >60 mL/min (ref >60)
GFR calc non Af Amer: >60 mL/min (ref >60)
Glucose, Bld: 109 mg/dL — ABNORMAL HIGH (ref 70–99)
Potassium: 3.9 mmol/L (ref 3.5–5.1)
Sodium: 138 mmol/L (ref 135–145)

## 2018-08-02 LAB — CBC WITH DIFFERENTIAL/PLATELET
Abs Immature Granulocytes: 0 10*3/uL (ref 0.00–0.07)
Basophils Absolute: 0 10*3/uL (ref 0.0–0.1)
Basophils Relative: 1 %
Eosinophils Absolute: 0 10*3/uL (ref 0.0–0.5)
Eosinophils Relative: 2 %
HCT: 32 % — ABNORMAL LOW (ref 39.0–52.0)
Hemoglobin: 11 g/dL — ABNORMAL LOW (ref 13.0–17.0)
Immature Granulocytes: 0 %
Lymphocytes Relative: 15 %
Lymphs Abs: 0.3 10*3/uL — ABNORMAL LOW (ref 0.7–4.0)
MCH: 31.3 pg (ref 26.0–34.0)
MCHC: 34.4 g/dL (ref 30.0–36.0)
MCV: 90.9 fL (ref 80.0–100.0)
Monocytes Absolute: 0.2 10*3/uL (ref 0.1–1.0)
Monocytes Relative: 9 %
Neutro Abs: 1.6 10*3/uL — ABNORMAL LOW (ref 1.7–7.7)
Neutrophils Relative %: 73 %
Platelets: 106 10*3/uL — ABNORMAL LOW (ref 150–400)
RBC: 3.52 MIL/uL — ABNORMAL LOW (ref 4.22–5.81)
RDW: 14.1 % (ref 11.5–15.5)
WBC: 2.2 10*3/uL — ABNORMAL LOW (ref 4.0–10.5)
nRBC: 0 % (ref 0.0–0.2)

## 2018-08-02 MED ORDER — PALONOSETRON HCL INJECTION 0.25 MG/5ML
0.2500 mg | Freq: Once | INTRAVENOUS | Status: AC
  Administered 2018-08-02: 13:00:00 0.25 mg via INTRAVENOUS
  Filled 2018-08-02: qty 5

## 2018-08-02 MED ORDER — SODIUM CHLORIDE 0.9 % IV SOLN
Freq: Once | INTRAVENOUS | Status: AC
  Administered 2018-08-02: 11:00:00 via INTRAVENOUS
  Filled 2018-08-02: qty 250

## 2018-08-02 MED ORDER — SODIUM CHLORIDE 0.9 % IV SOLN
Freq: Once | INTRAVENOUS | Status: AC
  Administered 2018-08-02: 13:00:00 via INTRAVENOUS
  Filled 2018-08-02: qty 5

## 2018-08-02 MED ORDER — SODIUM CHLORIDE 0.9 % IV SOLN
40.0000 mg/m2 | Freq: Once | INTRAVENOUS | Status: AC
  Administered 2018-08-02: 14:00:00 92 mg via INTRAVENOUS
  Filled 2018-08-02: qty 92

## 2018-08-02 MED ORDER — POTASSIUM CHLORIDE 2 MEQ/ML IV SOLN
Freq: Once | INTRAVENOUS | Status: AC
  Administered 2018-08-02: 11:00:00 via INTRAVENOUS
  Filled 2018-08-02: qty 1000

## 2018-08-02 NOTE — Progress Notes (Signed)
Patient stated that he does not have an appetite. Patient had lost 7 lbs since last week.

## 2018-08-03 ENCOUNTER — Ambulatory Visit
Admission: RE | Admit: 2018-08-03 | Discharge: 2018-08-03 | Disposition: A | Discharge status: Home / Self Care | Payer: BC Managed Care – PPO | Source: Ambulatory Visit | Attending: Radiation Oncology | Admitting: Radiation Oncology

## 2018-08-03 ENCOUNTER — Other Ambulatory Visit: Payer: Self-pay

## 2018-08-03 DIAGNOSIS — E669 Obesity, unspecified: Secondary | ICD-10-CM | POA: Diagnosis not present

## 2018-08-03 DIAGNOSIS — K219 Gastro-esophageal reflux disease without esophagitis: Secondary | ICD-10-CM | POA: Diagnosis not present

## 2018-08-03 DIAGNOSIS — N529 Male erectile dysfunction, unspecified: Secondary | ICD-10-CM | POA: Diagnosis not present

## 2018-08-03 DIAGNOSIS — Z7984 Long term (current) use of oral hypoglycemic drugs: Secondary | ICD-10-CM | POA: Diagnosis not present

## 2018-08-03 DIAGNOSIS — R590 Localized enlarged lymph nodes: Secondary | ICD-10-CM | POA: Diagnosis not present

## 2018-08-03 DIAGNOSIS — Z809 Family history of malignant neoplasm, unspecified: Secondary | ICD-10-CM | POA: Diagnosis not present

## 2018-08-03 DIAGNOSIS — Z79899 Other long term (current) drug therapy: Secondary | ICD-10-CM | POA: Diagnosis not present

## 2018-08-03 DIAGNOSIS — J439 Emphysema, unspecified: Secondary | ICD-10-CM | POA: Diagnosis not present

## 2018-08-03 DIAGNOSIS — Z87891 Personal history of nicotine dependence: Secondary | ICD-10-CM | POA: Diagnosis not present

## 2018-08-03 DIAGNOSIS — E785 Hyperlipidemia, unspecified: Secondary | ICD-10-CM | POA: Diagnosis not present

## 2018-08-03 DIAGNOSIS — Z87442 Personal history of urinary calculi: Secondary | ICD-10-CM | POA: Diagnosis not present

## 2018-08-03 DIAGNOSIS — E119 Type 2 diabetes mellitus without complications: Secondary | ICD-10-CM | POA: Diagnosis not present

## 2018-08-03 DIAGNOSIS — I10 Essential (primary) hypertension: Secondary | ICD-10-CM | POA: Diagnosis not present

## 2018-08-03 DIAGNOSIS — C099 Malignant neoplasm of tonsil, unspecified: Secondary | ICD-10-CM | POA: Diagnosis not present

## 2018-08-03 DIAGNOSIS — Z5111 Encounter for antineoplastic chemotherapy: Secondary | ICD-10-CM | POA: Diagnosis not present

## 2018-08-03 DIAGNOSIS — Z7982 Long term (current) use of aspirin: Secondary | ICD-10-CM | POA: Diagnosis not present

## 2018-08-04 ENCOUNTER — Other Ambulatory Visit: Payer: Self-pay | Admitting: *Deleted

## 2018-08-04 ENCOUNTER — Other Ambulatory Visit: Payer: Self-pay

## 2018-08-04 ENCOUNTER — Ambulatory Visit
Admission: RE | Admit: 2018-08-04 | Discharge: 2018-08-04 | Disposition: A | Discharge status: Home / Self Care | Payer: BC Managed Care – PPO | Source: Ambulatory Visit | Attending: Radiation Oncology | Admitting: Radiation Oncology

## 2018-08-04 DIAGNOSIS — E119 Type 2 diabetes mellitus without complications: Secondary | ICD-10-CM | POA: Diagnosis not present

## 2018-08-04 DIAGNOSIS — C099 Malignant neoplasm of tonsil, unspecified: Secondary | ICD-10-CM | POA: Diagnosis not present

## 2018-08-04 DIAGNOSIS — R590 Localized enlarged lymph nodes: Secondary | ICD-10-CM | POA: Diagnosis not present

## 2018-08-04 DIAGNOSIS — Z87891 Personal history of nicotine dependence: Secondary | ICD-10-CM | POA: Diagnosis not present

## 2018-08-04 DIAGNOSIS — E785 Hyperlipidemia, unspecified: Secondary | ICD-10-CM | POA: Diagnosis not present

## 2018-08-04 DIAGNOSIS — E669 Obesity, unspecified: Secondary | ICD-10-CM | POA: Diagnosis not present

## 2018-08-04 DIAGNOSIS — Z87442 Personal history of urinary calculi: Secondary | ICD-10-CM | POA: Diagnosis not present

## 2018-08-04 DIAGNOSIS — Z7982 Long term (current) use of aspirin: Secondary | ICD-10-CM | POA: Diagnosis not present

## 2018-08-04 DIAGNOSIS — Z5111 Encounter for antineoplastic chemotherapy: Secondary | ICD-10-CM | POA: Diagnosis not present

## 2018-08-04 DIAGNOSIS — Z79899 Other long term (current) drug therapy: Secondary | ICD-10-CM | POA: Diagnosis not present

## 2018-08-04 DIAGNOSIS — Z7984 Long term (current) use of oral hypoglycemic drugs: Secondary | ICD-10-CM | POA: Diagnosis not present

## 2018-08-04 DIAGNOSIS — N529 Male erectile dysfunction, unspecified: Secondary | ICD-10-CM | POA: Diagnosis not present

## 2018-08-04 DIAGNOSIS — I10 Essential (primary) hypertension: Secondary | ICD-10-CM | POA: Diagnosis not present

## 2018-08-04 DIAGNOSIS — Z809 Family history of malignant neoplasm, unspecified: Secondary | ICD-10-CM | POA: Diagnosis not present

## 2018-08-04 DIAGNOSIS — J439 Emphysema, unspecified: Secondary | ICD-10-CM | POA: Diagnosis not present

## 2018-08-04 DIAGNOSIS — K219 Gastro-esophageal reflux disease without esophagitis: Secondary | ICD-10-CM | POA: Diagnosis not present

## 2018-08-04 MED ORDER — DEXAMETHASONE 4 MG PO TABS: 4.0000 mg | ORAL_TABLET | Freq: Every day | ORAL | 0 refills | Status: DC

## 2018-08-05 ENCOUNTER — Other Ambulatory Visit: Payer: Self-pay

## 2018-08-05 ENCOUNTER — Ambulatory Visit
Admission: RE | Admit: 2018-08-05 | Discharge: 2018-08-05 | Disposition: A | Discharge status: Home / Self Care | Payer: BC Managed Care – PPO | Source: Ambulatory Visit | Attending: Radiation Oncology | Admitting: Radiation Oncology

## 2018-08-05 DIAGNOSIS — E785 Hyperlipidemia, unspecified: Secondary | ICD-10-CM | POA: Diagnosis not present

## 2018-08-05 DIAGNOSIS — Z7982 Long term (current) use of aspirin: Secondary | ICD-10-CM | POA: Diagnosis not present

## 2018-08-05 DIAGNOSIS — R590 Localized enlarged lymph nodes: Secondary | ICD-10-CM | POA: Diagnosis not present

## 2018-08-05 DIAGNOSIS — Z5111 Encounter for antineoplastic chemotherapy: Secondary | ICD-10-CM | POA: Diagnosis not present

## 2018-08-05 DIAGNOSIS — Z79899 Other long term (current) drug therapy: Secondary | ICD-10-CM | POA: Diagnosis not present

## 2018-08-05 DIAGNOSIS — E119 Type 2 diabetes mellitus without complications: Secondary | ICD-10-CM | POA: Diagnosis not present

## 2018-08-05 DIAGNOSIS — E669 Obesity, unspecified: Secondary | ICD-10-CM | POA: Diagnosis not present

## 2018-08-05 DIAGNOSIS — N529 Male erectile dysfunction, unspecified: Secondary | ICD-10-CM | POA: Diagnosis not present

## 2018-08-05 DIAGNOSIS — C099 Malignant neoplasm of tonsil, unspecified: Secondary | ICD-10-CM | POA: Diagnosis not present

## 2018-08-05 DIAGNOSIS — J439 Emphysema, unspecified: Secondary | ICD-10-CM | POA: Diagnosis not present

## 2018-08-05 DIAGNOSIS — Z87442 Personal history of urinary calculi: Secondary | ICD-10-CM | POA: Diagnosis not present

## 2018-08-05 DIAGNOSIS — K219 Gastro-esophageal reflux disease without esophagitis: Secondary | ICD-10-CM | POA: Diagnosis not present

## 2018-08-05 DIAGNOSIS — Z7984 Long term (current) use of oral hypoglycemic drugs: Secondary | ICD-10-CM | POA: Diagnosis not present

## 2018-08-05 DIAGNOSIS — I10 Essential (primary) hypertension: Secondary | ICD-10-CM | POA: Diagnosis not present

## 2018-08-05 DIAGNOSIS — Z87891 Personal history of nicotine dependence: Secondary | ICD-10-CM | POA: Diagnosis not present

## 2018-08-05 DIAGNOSIS — Z809 Family history of malignant neoplasm, unspecified: Secondary | ICD-10-CM | POA: Diagnosis not present

## 2018-08-07 NOTE — Progress Notes (Signed)
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  INTERVAL HISTORY: Patient returns to clinic today for further evaluation and consideration of cycle 6 of weekly cisplatin.  He continues to have dysphasia, but otherwise feels well.  He denies any weakness or fatigue. He has no neurologic complaints.  He denies any recent fevers or illnesses. He has no chest pain, shortness of breath, cough, or hemoptysis.  He has no nausea, vomiting, constipation, or diarrhea.  He has no urinary complaints.  Patient offers no further specific complaints today.  REVIEW OF SYSTEMS:   Review of Systems  Constitutional: Positive for weight loss. Negative for fever and malaise/fatigue.  HENT: Positive for sore throat.   Respiratory: Negative.  Negative for cough and shortness of breath.   Cardiovascular: Negative.  Negative for chest pain and leg swelling.  Gastrointestinal: Negative.  Negative for abdominal pain.  Genitourinary: Negative.  Negative for dysuria.  Musculoskeletal: Negative.  Negative for back pain.  Skin: Negative.  Negative for rash.  Neurological: Negative.  Negative for speech change, weakness and headaches.  Psychiatric/Behavioral: Negative.  The patient is not nervous/anxious.     As per HPI. Otherwise, a complete review of systems is negative.  PAST MEDICAL HISTORY: Past Medical History:  Diagnosis Date  . ED (erectile dysfunction)   . GERD (gastroesophageal reflux disease)   . History of kidney stones 2013   s/p lithotripsy  . HLD (hyperlipidemia)   . HTN (hypertension)   . Obesity   . T2DM (type 2 diabetes mellitus) ([place])    DSME at [facility] 01/2012  . Tonsillar mass 05/23/2018    PAST SURGICAL HISTORY: Past Surgical History:  Procedure  Laterality Date  . CHOLECYSTECTOMY  2007  . COLONOSCOPY  11/2008   2 hyperplastic polyps [name])  . EXTRACORPOREAL SHOCK WAVE LITHOTRIPSY Left 04/28/2018   Procedure: EXTRACORPOREAL SHOCK WAVE LITHOTRIPSY (ESWL);  Surgeon: Billey Co, MD;  Location: [facility] ORS;  Service: Urology;  Laterality: Left;  . LITHOTRIPSY  2013   x 2    FAMILY HISTORY: Family History  Problem Relation Age of Onset  . Cancer Father 71       lung, smoker  . Aneurysm Mother 4       brain  . Coronary artery disease Neg Hx   . Stroke Neg Hx   . Diabetes Neg Hx     ADVANCED DIRECTIVES (Y/N):  N  HEALTH MAINTENANCE: Social History   Tobacco Use  . Smoking status: Former Smoker    Quit date: 02/10/2007    Years since quitting: 11.5  . Smokeless tobacco: Never Used  Substance Use Topics  . Alcohol use: Yes    Alcohol/week: 0.0 standard drinks    Comment: 5 drinks/week   . Drug use: Yes    Comment: rare MJ     Colonoscopy:  PAP:  Bone density:  Lipid panel:  Allergies  Allergen Reactions  . Oxycodone Nausea And Vomiting    Current Outpatient Medications  Medication Sig Dispense Refill  . aspirin 81 MG tablet Take 81 mg by mouth daily.    Marland Kitchen atorvastatin (LIPITOR) 40 MG tablet TAKE 1 TABLET BY MOUTH EVERY DAY 90 tablet 0  . Blood Glucose Monitoring Suppl (BAYER CONTOUR MONITOR) w/Device KIT by Does not apply route. Use  to check sugar daily and as needed Dx: E11.65    . dexamethasone (DECADRON) 4 MG tablet Take 1 tablet (4 mg total) by mouth daily. 20 tablet 0  . docusate sodium (COLACE) 100 MG capsule Take 100 mg by mouth daily.    . fenofibrate 160 MG tablet TAKE 1 TABLET BY MOUTH EVERY DAY 90 tablet 0  . glucose blood test strip 1 each by Other route as needed for other. Use as instructed to check sugar once daily and as needed. E11.65 **Contour**    . Lancets MISC by Does not apply route. Use as directed to check sugar once daily and as needed. Dx. E11.65 **Contour**    . lisinopril  (ZESTRIL) 10 MG tablet TAKE 1 TABLET BY MOUTH EVERY DAY 90 tablet 0  . metFORMIN (GLUCOPHAGE) 500 MG tablet TAKE 1 TABLET BY MOUTH EVERY DAY 90 tablet 0  . Multiple Vitamin (MULTIVITAMIN) tablet Take 1 tablet by mouth daily.    . OMEGA-3 FATTY ACIDS PO Take 1 capsule by mouth daily.    Marland Kitchen omeprazole (PRILOSEC OTC) 20 MG tablet Take 20 mg by mouth daily.    . ondansetron (ZOFRAN) 8 MG tablet Take 1 tablet (8 mg total) by mouth 2 (two) times daily as needed. Start on the third day after chemotherapy. 30 tablet 1  . prochlorperazine (COMPAZINE) 10 MG tablet Take 1 tablet (10 mg total) by mouth every 6 (six) hours as needed (Nausea or vomiting). 60 tablet 1  . sildenafil (VIAGRA) 100 MG tablet Take 100 mg by mouth daily as needed for erectile dysfunction.    . sucralfate (CARAFATE) 1 g tablet Take 1 tablet (1 g total) by mouth 3 (three) times daily. Dissolve in 3-4 tbsp warm water, swish and swallow. 90 tablet 3  . tamsulosin (FLOMAX) 0.4 MG CAPS capsule Take 1 capsule (0.4 mg total) by mouth daily after supper. 30 capsule 0   No current facility-administered medications for this visit.     OBJECTIVE: Vitals:   08/09/18 0929  BP: 112/70  Pulse: 83  Temp: (!) 96.9 F (36.1 C)     Body mass index is 29.15 kg/m.    ECOG FS:0 - Asymptomatic  General: Well-developed, well-nourished, no acute distress. Eyes: Pink conjunctiva, anicteric sclera. HEENT: Normocephalic, moist mucous membranes, clear oropharnyx.  No palpable lymphadenopathy. Lungs: Clear to auscultation bilaterally. Heart: Regular rate and rhythm. No rubs, murmurs, or gallops. Abdomen: Soft, nontender, nondistended. No organomegaly noted, normoactive bowel sounds. Musculoskeletal: No edema, cyanosis, or clubbing. Neuro: Alert, answering all questions appropriately. Cranial nerves grossly intact. Skin: No rashes or petechiae noted. Psych: Normal affect.  LAB RESULTS:  Lab Results  Component Value Date   NA 136 08/09/2018   K  3.7 08/09/2018   CL 101 08/09/2018   CO2 26 08/09/2018   GLUCOSE 178 (H) 08/09/2018   BUN 38 (H) 08/09/2018   CREATININE 1.24 08/09/2018   CALCIUM 9.5 08/09/2018   PROT 6.5 05/27/2017   ALBUMIN 4.3 05/27/2017   AST 19 05/27/2017   ALT 18 05/27/2017   ALKPHOS 47 05/27/2017   BILITOT 0.8 05/27/2017   GFRNONAA >60 08/09/2018   GFRAA >60 08/09/2018    Lab Results  Component Value Date   WBC 2.8 (L) 08/09/2018   NEUTROABS 1.9 08/09/2018   HGB 11.1 (L) 08/09/2018   HCT 31.5 (L) 08/09/2018   MCV 91.0 08/09/2018   PLT 159 08/09/2018     STUDIES: No results found.  ASSESSMENT: Stage I squamous cell cancer of  the right tonsil, p16+  PLAN:   1. 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  Patient was also discussed at tumor board.  He will benefit from concurrent chemotherapy using weekly cisplatin along with daily XRT. He does not require port placement at this time, but will consider one later if his IV access becomes difficult.  Continue daily XRT completing on August 18, 2018.  Proceed with cycle 6 of weekly cisplatin today.  Return to clinic in 1 week for further evaluation and consideration of cycle 7.  Will reimage with PET scan approximately 2 months after the completion of his XRT. 2.  Dysphasia: Chronic and unchanged.  Continue Carafate and Magic mouthwash as prescribed.   3.  Hyperglycemia: Blood glucose more elevated today at 178. 4.  Renal insufficiency: Patient's creatinine is now within normal limits. 5.  Thrombocytopenia: Resolved. 6.  Neutropenia: Improved.  Proceed with treatment as above.  Patient expressed understanding and was in agreement with this plan. He also understands that He can call clinic at any time with any questions, concerns, or complaints.   Cancer Staging Squamous cell carcinoma of right tonsil ([place]) Staging form: Pharynx - HPV-Mediated  Oropharynx, AJCC 8th Edition - Clinical stage from 06/12/2018: Stage I (cT2, cN1, cM0, p16+) - Signed by Lloyd Huger, MD on 06/12/2018   Lloyd Huger, MD   08/10/2018 6:29 AM

## 2018-08-08 ENCOUNTER — Ambulatory Visit
Admission: RE | Admit: 2018-08-08 | Discharge: 2018-08-08 | Disposition: A | Discharge status: Home / Self Care | Payer: BC Managed Care – PPO | Source: Ambulatory Visit | Attending: Radiation Oncology | Admitting: Radiation Oncology

## 2018-08-08 DIAGNOSIS — E785 Hyperlipidemia, unspecified: Secondary | ICD-10-CM | POA: Diagnosis not present

## 2018-08-08 DIAGNOSIS — E669 Obesity, unspecified: Secondary | ICD-10-CM | POA: Diagnosis not present

## 2018-08-08 DIAGNOSIS — E119 Type 2 diabetes mellitus without complications: Secondary | ICD-10-CM | POA: Diagnosis not present

## 2018-08-08 DIAGNOSIS — Z5111 Encounter for antineoplastic chemotherapy: Secondary | ICD-10-CM | POA: Diagnosis not present

## 2018-08-08 DIAGNOSIS — Z87442 Personal history of urinary calculi: Secondary | ICD-10-CM | POA: Diagnosis not present

## 2018-08-08 DIAGNOSIS — Z7982 Long term (current) use of aspirin: Secondary | ICD-10-CM | POA: Diagnosis not present

## 2018-08-08 DIAGNOSIS — C099 Malignant neoplasm of tonsil, unspecified: Secondary | ICD-10-CM | POA: Diagnosis not present

## 2018-08-08 DIAGNOSIS — J439 Emphysema, unspecified: Secondary | ICD-10-CM | POA: Diagnosis not present

## 2018-08-08 DIAGNOSIS — Z809 Family history of malignant neoplasm, unspecified: Secondary | ICD-10-CM | POA: Diagnosis not present

## 2018-08-08 DIAGNOSIS — Z7984 Long term (current) use of oral hypoglycemic drugs: Secondary | ICD-10-CM | POA: Diagnosis not present

## 2018-08-08 DIAGNOSIS — K219 Gastro-esophageal reflux disease without esophagitis: Secondary | ICD-10-CM | POA: Diagnosis not present

## 2018-08-08 DIAGNOSIS — R590 Localized enlarged lymph nodes: Secondary | ICD-10-CM | POA: Diagnosis not present

## 2018-08-08 DIAGNOSIS — N529 Male erectile dysfunction, unspecified: Secondary | ICD-10-CM | POA: Diagnosis not present

## 2018-08-08 DIAGNOSIS — I10 Essential (primary) hypertension: Secondary | ICD-10-CM | POA: Diagnosis not present

## 2018-08-08 DIAGNOSIS — Z87891 Personal history of nicotine dependence: Secondary | ICD-10-CM | POA: Diagnosis not present

## 2018-08-08 DIAGNOSIS — Z79899 Other long term (current) drug therapy: Secondary | ICD-10-CM | POA: Diagnosis not present

## 2018-08-09 ENCOUNTER — Inpatient Hospital Stay (HOSPITAL_BASED_OUTPATIENT_CLINIC_OR_DEPARTMENT_OTHER): Payer: BC Managed Care – PPO | Admitting: Oncology

## 2018-08-09 ENCOUNTER — Other Ambulatory Visit: Payer: Self-pay

## 2018-08-09 ENCOUNTER — Encounter: Payer: Self-pay | Admitting: Oncology

## 2018-08-09 ENCOUNTER — Ambulatory Visit
Admission: RE | Admit: 2018-08-09 | Discharge: 2018-08-09 | Disposition: A | Discharge status: Home / Self Care | Payer: BC Managed Care – PPO | Source: Ambulatory Visit | Attending: Radiation Oncology | Admitting: Radiation Oncology

## 2018-08-09 ENCOUNTER — Inpatient Hospital Stay: Payer: BC Managed Care – PPO

## 2018-08-09 VITALS — BP 112/70 | HR 83 | Temp 96.9°F | Ht 71.0 in | Wt 209.0 lb

## 2018-08-09 DIAGNOSIS — D709 Neutropenia, unspecified: Secondary | ICD-10-CM | POA: Diagnosis not present

## 2018-08-09 DIAGNOSIS — E669 Obesity, unspecified: Secondary | ICD-10-CM | POA: Diagnosis not present

## 2018-08-09 DIAGNOSIS — I10 Essential (primary) hypertension: Secondary | ICD-10-CM

## 2018-08-09 DIAGNOSIS — E119 Type 2 diabetes mellitus without complications: Secondary | ICD-10-CM | POA: Diagnosis not present

## 2018-08-09 DIAGNOSIS — R590 Localized enlarged lymph nodes: Secondary | ICD-10-CM | POA: Diagnosis not present

## 2018-08-09 DIAGNOSIS — D6959 Other secondary thrombocytopenia: Secondary | ICD-10-CM | POA: Diagnosis not present

## 2018-08-09 DIAGNOSIS — C099 Malignant neoplasm of tonsil, unspecified: Secondary | ICD-10-CM | POA: Diagnosis not present

## 2018-08-09 DIAGNOSIS — E1165 Type 2 diabetes mellitus with hyperglycemia: Secondary | ICD-10-CM | POA: Diagnosis not present

## 2018-08-09 DIAGNOSIS — Z7982 Long term (current) use of aspirin: Secondary | ICD-10-CM | POA: Diagnosis not present

## 2018-08-09 DIAGNOSIS — Z87442 Personal history of urinary calculi: Secondary | ICD-10-CM | POA: Diagnosis not present

## 2018-08-09 DIAGNOSIS — R4702 Dysphasia: Secondary | ICD-10-CM | POA: Diagnosis not present

## 2018-08-09 DIAGNOSIS — Z87891 Personal history of nicotine dependence: Secondary | ICD-10-CM | POA: Diagnosis not present

## 2018-08-09 DIAGNOSIS — Z7984 Long term (current) use of oral hypoglycemic drugs: Secondary | ICD-10-CM | POA: Diagnosis not present

## 2018-08-09 DIAGNOSIS — N289 Disorder of kidney and ureter, unspecified: Secondary | ICD-10-CM | POA: Diagnosis not present

## 2018-08-09 DIAGNOSIS — K219 Gastro-esophageal reflux disease without esophagitis: Secondary | ICD-10-CM | POA: Diagnosis not present

## 2018-08-09 DIAGNOSIS — Z79899 Other long term (current) drug therapy: Secondary | ICD-10-CM | POA: Diagnosis not present

## 2018-08-09 DIAGNOSIS — Z5111 Encounter for antineoplastic chemotherapy: Secondary | ICD-10-CM | POA: Diagnosis not present

## 2018-08-09 DIAGNOSIS — N529 Male erectile dysfunction, unspecified: Secondary | ICD-10-CM | POA: Diagnosis not present

## 2018-08-09 DIAGNOSIS — Z809 Family history of malignant neoplasm, unspecified: Secondary | ICD-10-CM | POA: Diagnosis not present

## 2018-08-09 DIAGNOSIS — E785 Hyperlipidemia, unspecified: Secondary | ICD-10-CM | POA: Diagnosis not present

## 2018-08-09 DIAGNOSIS — J439 Emphysema, unspecified: Secondary | ICD-10-CM | POA: Diagnosis not present

## 2018-08-09 LAB — CBC WITH DIFFERENTIAL/PLATELET
Abs Immature Granulocytes: 0.08 10*3/uL — ABNORMAL HIGH (ref 0.00–0.07)
Basophils Absolute: 0 10*3/uL (ref 0.0–0.1)
Basophils Relative: 0 %
Eosinophils Absolute: 0 10*3/uL (ref 0.0–0.5)
Eosinophils Relative: 1 %
HCT: 31.5 % — ABNORMAL LOW (ref 39.0–52.0)
Hemoglobin: 11.1 g/dL — ABNORMAL LOW (ref 13.0–17.0)
Immature Granulocytes: 3 %
Lymphocytes Relative: 17 %
Lymphs Abs: 0.5 10*3/uL — ABNORMAL LOW (ref 0.7–4.0)
MCH: 32.1 pg (ref 26.0–34.0)
MCHC: 35.2 g/dL (ref 30.0–36.0)
MCV: 91 fL (ref 80.0–100.0)
Monocytes Absolute: 0.3 10*3/uL (ref 0.1–1.0)
Monocytes Relative: 11 %
Neutro Abs: 1.9 10*3/uL (ref 1.7–7.7)
Neutrophils Relative %: 68 %
Platelets: 159 10*3/uL (ref 150–400)
RBC: 3.46 MIL/uL — ABNORMAL LOW (ref 4.22–5.81)
RDW: 14.6 % (ref 11.5–15.5)
WBC: 2.8 10*3/uL — ABNORMAL LOW (ref 4.0–10.5)
nRBC: 0 % (ref 0.0–0.2)

## 2018-08-09 LAB — BASIC METABOLIC PANEL
Anion gap: 9 (ref 5–15)
BUN: 38 mg/dL — ABNORMAL HIGH (ref 8–23)
CO2: 26 mmol/L (ref 22–32)
Calcium: 9.5 mg/dL (ref 8.9–10.3)
Chloride: 101 mmol/L (ref 98–111)
Creatinine, Ser: 1.24 mg/dL (ref 0.61–1.24)
GFR calc Af Amer: >60 mL/min (ref >60)
GFR calc non Af Amer: >60 mL/min (ref >60)
Glucose, Bld: 178 mg/dL — ABNORMAL HIGH (ref 70–99)
Potassium: 3.7 mmol/L (ref 3.5–5.1)
Sodium: 136 mmol/L (ref 135–145)

## 2018-08-09 MED ORDER — SODIUM CHLORIDE 0.9 % IV SOLN
40.0000 mg/m2 | Freq: Once | INTRAVENOUS | Status: AC
  Administered 2018-08-09: 92 mg via INTRAVENOUS
  Filled 2018-08-09: qty 92

## 2018-08-09 MED ORDER — POTASSIUM CHLORIDE 2 MEQ/ML IV SOLN
Freq: Once | INTRAVENOUS | Status: AC
  Administered 2018-08-09: 11:00:00 via INTRAVENOUS
  Filled 2018-08-09: qty 1000

## 2018-08-09 MED ORDER — PALONOSETRON HCL INJECTION 0.25 MG/5ML
0.2500 mg | Freq: Once | INTRAVENOUS | Status: AC
  Administered 2018-08-09: 0.25 mg via INTRAVENOUS
  Filled 2018-08-09: qty 5

## 2018-08-09 MED ORDER — SODIUM CHLORIDE 0.9 % IV SOLN
Freq: Once | INTRAVENOUS | Status: AC
  Administered 2018-08-09: 10:00:00 via INTRAVENOUS
  Filled 2018-08-09: qty 250

## 2018-08-09 MED ORDER — SODIUM CHLORIDE 0.9 % IV SOLN
Freq: Once | INTRAVENOUS | Status: AC
  Administered 2018-08-09: 13:00:00 via INTRAVENOUS
  Filled 2018-08-09: qty 5

## 2018-08-09 NOTE — Progress Notes (Signed)
Patient stated that he had been doing okay but he felt that he almost passed out when he was mowing his lawn. Patient denied fever, chills, nausea, vomiting, constipation or diarrhea.

## 2018-08-10 ENCOUNTER — Other Ambulatory Visit: Payer: Self-pay

## 2018-08-10 ENCOUNTER — Ambulatory Visit
Admission: RE | Admit: 2018-08-10 | Discharge: 2018-08-10 | Disposition: A | Discharge status: Home / Self Care | Payer: BC Managed Care – PPO | Source: Ambulatory Visit | Attending: Radiation Oncology | Admitting: Radiation Oncology

## 2018-08-10 DIAGNOSIS — K219 Gastro-esophageal reflux disease without esophagitis: Secondary | ICD-10-CM | POA: Insufficient documentation

## 2018-08-10 DIAGNOSIS — E119 Type 2 diabetes mellitus without complications: Secondary | ICD-10-CM | POA: Insufficient documentation

## 2018-08-10 DIAGNOSIS — Z87442 Personal history of urinary calculi: Secondary | ICD-10-CM | POA: Insufficient documentation

## 2018-08-10 DIAGNOSIS — J439 Emphysema, unspecified: Secondary | ICD-10-CM | POA: Insufficient documentation

## 2018-08-10 DIAGNOSIS — N529 Male erectile dysfunction, unspecified: Secondary | ICD-10-CM | POA: Diagnosis not present

## 2018-08-10 DIAGNOSIS — Z5111 Encounter for antineoplastic chemotherapy: Secondary | ICD-10-CM | POA: Diagnosis not present

## 2018-08-10 DIAGNOSIS — E669 Obesity, unspecified: Secondary | ICD-10-CM | POA: Diagnosis not present

## 2018-08-10 DIAGNOSIS — C099 Malignant neoplasm of tonsil, unspecified: Secondary | ICD-10-CM | POA: Insufficient documentation

## 2018-08-10 DIAGNOSIS — Z87891 Personal history of nicotine dependence: Secondary | ICD-10-CM | POA: Insufficient documentation

## 2018-08-10 DIAGNOSIS — I10 Essential (primary) hypertension: Secondary | ICD-10-CM | POA: Diagnosis not present

## 2018-08-10 DIAGNOSIS — E785 Hyperlipidemia, unspecified: Secondary | ICD-10-CM | POA: Diagnosis not present

## 2018-08-10 DIAGNOSIS — Z7984 Long term (current) use of oral hypoglycemic drugs: Secondary | ICD-10-CM | POA: Diagnosis not present

## 2018-08-10 DIAGNOSIS — Z79899 Other long term (current) drug therapy: Secondary | ICD-10-CM | POA: Insufficient documentation

## 2018-08-10 DIAGNOSIS — R590 Localized enlarged lymph nodes: Secondary | ICD-10-CM | POA: Diagnosis not present

## 2018-08-10 DIAGNOSIS — Z809 Family history of malignant neoplasm, unspecified: Secondary | ICD-10-CM | POA: Diagnosis not present

## 2018-08-10 DIAGNOSIS — Z7982 Long term (current) use of aspirin: Secondary | ICD-10-CM | POA: Diagnosis not present

## 2018-08-11 ENCOUNTER — Ambulatory Visit
Admission: RE | Admit: 2018-08-11 | Discharge: 2018-08-11 | Disposition: A | Discharge status: Home / Self Care | Payer: BC Managed Care – PPO | Source: Ambulatory Visit | Attending: Radiation Oncology | Admitting: Radiation Oncology

## 2018-08-11 ENCOUNTER — Other Ambulatory Visit: Payer: Self-pay

## 2018-08-11 DIAGNOSIS — Z79899 Other long term (current) drug therapy: Secondary | ICD-10-CM | POA: Diagnosis not present

## 2018-08-11 DIAGNOSIS — E785 Hyperlipidemia, unspecified: Secondary | ICD-10-CM | POA: Diagnosis not present

## 2018-08-11 DIAGNOSIS — Z87891 Personal history of nicotine dependence: Secondary | ICD-10-CM | POA: Diagnosis not present

## 2018-08-11 DIAGNOSIS — E669 Obesity, unspecified: Secondary | ICD-10-CM | POA: Diagnosis not present

## 2018-08-11 DIAGNOSIS — C099 Malignant neoplasm of tonsil, unspecified: Secondary | ICD-10-CM | POA: Diagnosis not present

## 2018-08-11 DIAGNOSIS — Z5111 Encounter for antineoplastic chemotherapy: Secondary | ICD-10-CM | POA: Diagnosis not present

## 2018-08-11 DIAGNOSIS — Z7984 Long term (current) use of oral hypoglycemic drugs: Secondary | ICD-10-CM | POA: Diagnosis not present

## 2018-08-11 DIAGNOSIS — R590 Localized enlarged lymph nodes: Secondary | ICD-10-CM | POA: Diagnosis not present

## 2018-08-11 DIAGNOSIS — J439 Emphysema, unspecified: Secondary | ICD-10-CM | POA: Diagnosis not present

## 2018-08-11 DIAGNOSIS — Z7982 Long term (current) use of aspirin: Secondary | ICD-10-CM | POA: Diagnosis not present

## 2018-08-11 DIAGNOSIS — I10 Essential (primary) hypertension: Secondary | ICD-10-CM | POA: Diagnosis not present

## 2018-08-11 DIAGNOSIS — Z87442 Personal history of urinary calculi: Secondary | ICD-10-CM | POA: Diagnosis not present

## 2018-08-11 DIAGNOSIS — Z809 Family history of malignant neoplasm, unspecified: Secondary | ICD-10-CM | POA: Diagnosis not present

## 2018-08-11 DIAGNOSIS — K219 Gastro-esophageal reflux disease without esophagitis: Secondary | ICD-10-CM | POA: Diagnosis not present

## 2018-08-11 DIAGNOSIS — N529 Male erectile dysfunction, unspecified: Secondary | ICD-10-CM | POA: Diagnosis not present

## 2018-08-11 DIAGNOSIS — E119 Type 2 diabetes mellitus without complications: Secondary | ICD-10-CM | POA: Diagnosis not present

## 2018-08-13 NOTE — Progress Notes (Signed)
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  INTERVAL HISTORY: Patient returns to clinic today for further evaluation and consideration of cycle 7 of weekly cisplatin.  He continues to have dysphasia, but is otherwise tolerating his treatments well.  He has no neurologic complaints.  He denies any weakness or fatigue.  He denies any recent fevers or illnesses. He has no chest pain, shortness of breath, cough, or hemoptysis.  He has no nausea, vomiting, constipation, or diarrhea.  He has no urinary complaints.  Patient offers no further specific complaints today.  REVIEW OF SYSTEMS:   Review of Systems  Constitutional: Positive for weight loss. Negative for fever and malaise/fatigue.  HENT: Positive for sore throat.   Respiratory: Negative.  Negative for cough and shortness of breath.   Cardiovascular: Negative.  Negative for chest pain and leg swelling.  Gastrointestinal: Negative.  Negative for abdominal pain.  Genitourinary: Negative.  Negative for dysuria.  Musculoskeletal: Negative.  Negative for back pain.  Skin: Negative.  Negative for rash.  Neurological: Negative.  Negative for speech change, weakness and headaches.  Psychiatric/Behavioral: Negative.  The patient is not nervous/anxious.     As per HPI. Otherwise, a complete review of systems is negative.  PAST MEDICAL HISTORY: Past Medical History:  Diagnosis Date  . ED (erectile dysfunction)   . GERD (gastroesophageal reflux disease)   . History of kidney stones 2013   s/p lithotripsy  . HLD (hyperlipidemia)   . HTN (hypertension)   . Obesity   . T2DM (type 2 diabetes mellitus) ([place])    DSME at [facility] 01/2012  . Tonsillar mass 05/23/2018    PAST SURGICAL HISTORY: Past  Surgical History:  Procedure Laterality Date  . CHOLECYSTECTOMY  2007  . COLONOSCOPY  11/2008   2 hyperplastic polyps [name])  . EXTRACORPOREAL SHOCK WAVE LITHOTRIPSY Left 04/28/2018   Procedure: EXTRACORPOREAL SHOCK WAVE LITHOTRIPSY (ESWL);  Surgeon: Billey Co, MD;  Location: [facility] ORS;  Service: Urology;  Laterality: Left;  . LITHOTRIPSY  2013   x 2    FAMILY HISTORY: Family History  Problem Relation Age of Onset  . Cancer Father 31       lung, smoker  . Aneurysm Mother 89       brain  . Coronary artery disease Neg Hx   . Stroke Neg Hx   . Diabetes Neg Hx     ADVANCED DIRECTIVES (Y/N):  N  HEALTH MAINTENANCE: Social History   Tobacco Use  . Smoking status: Former Smoker    Quit date: 02/10/2007    Years since quitting: 11.5  . Smokeless tobacco: Never Used  Substance Use Topics  . Alcohol use: Yes    Alcohol/week: 0.0 standard drinks    Comment: 5 drinks/week   . Drug use: Yes    Comment: rare MJ     Colonoscopy:  PAP:  Bone density:  Lipid panel:  Allergies  Allergen Reactions  . Oxycodone Nausea And Vomiting    Current Outpatient Medications  Medication Sig Dispense Refill  . aspirin 81 MG tablet Take 81 mg by mouth daily.    Marland Kitchen atorvastatin (LIPITOR) 40 MG tablet TAKE 1 TABLET BY MOUTH EVERY DAY 90 tablet 0  . Blood Glucose Monitoring Suppl (BAYER CONTOUR MONITOR) w/Device KIT by Does  not apply route. Use to check sugar daily and as needed Dx: E11.65    . dexamethasone (DECADRON) 4 MG tablet Take 1 tablet (4 mg total) by mouth daily. 20 tablet 0  . docusate sodium (COLACE) 100 MG capsule Take 100 mg by mouth daily.    . fenofibrate 160 MG tablet TAKE 1 TABLET BY MOUTH EVERY DAY 90 tablet 0  . glucose blood test strip 1 each by Other route as needed for other. Use as instructed to check sugar once daily and as needed. E11.65 **Contour**    . Lancets MISC by Does not apply route. Use as directed to check sugar once daily and as needed. Dx. E11.65  **Contour**    . lisinopril (ZESTRIL) 10 MG tablet TAKE 1 TABLET BY MOUTH EVERY DAY 90 tablet 0  . metFORMIN (GLUCOPHAGE) 500 MG tablet TAKE 1 TABLET BY MOUTH EVERY DAY 90 tablet 0  . Multiple Vitamin (MULTIVITAMIN) tablet Take 1 tablet by mouth daily.    . OMEGA-3 FATTY ACIDS PO Take 1 capsule by mouth daily.    Marland Kitchen omeprazole (PRILOSEC OTC) 20 MG tablet Take 20 mg by mouth daily.    . ondansetron (ZOFRAN) 8 MG tablet Take 1 tablet (8 mg total) by mouth 2 (two) times daily as needed. Start on the third day after chemotherapy. 30 tablet 1  . prochlorperazine (COMPAZINE) 10 MG tablet Take 1 tablet (10 mg total) by mouth every 6 (six) hours as needed (Nausea or vomiting). 60 tablet 1  . sildenafil (VIAGRA) 100 MG tablet Take 100 mg by mouth daily as needed for erectile dysfunction.    . sucralfate (CARAFATE) 1 g tablet Take 1 tablet (1 g total) by mouth 3 (three) times daily. Dissolve in 3-4 tbsp warm water, swish and swallow. 90 tablet 3  . tamsulosin (FLOMAX) 0.4 MG CAPS capsule Take 1 capsule (0.4 mg total) by mouth daily after supper. 30 capsule 0   No current facility-administered medications for this visit.     OBJECTIVE: Vitals:   08/16/18 0843  BP: 104/68  Pulse: 87  Temp: (!) 97.3 F (36.3 C)     Body mass index is 28.59 kg/m.    ECOG FS:0 - Asymptomatic  General: Well-developed, well-nourished, no acute distress. Eyes: Pink conjunctiva, anicteric sclera. HEENT: Normocephalic, moist mucous membranes, clear oropharnyx.  No palpable lymphadenopathy Lungs: Clear to auscultation bilaterally. Heart: Regular rate and rhythm. No rubs, murmurs, or gallops. Abdomen: Soft, nontender, nondistended. No organomegaly noted, normoactive bowel sounds. Musculoskeletal: No edema, cyanosis, or clubbing. Neuro: Alert, answering all questions appropriately. Cranial nerves grossly intact. Skin: No rashes or petechiae noted. Psych: Normal affect.  LAB RESULTS:  Lab Results  Component Value Date    NA 136 08/18/2018   K 3.7 08/18/2018   CL 102 08/18/2018   CO2 25 08/18/2018   GLUCOSE 91 08/18/2018   BUN 33 (H) 08/18/2018   CREATININE 1.16 08/18/2018   CALCIUM 8.9 08/18/2018   PROT 6.5 05/27/2017   ALBUMIN 4.3 05/27/2017   AST 19 05/27/2017   ALT 18 05/27/2017   ALKPHOS 47 05/27/2017   BILITOT 0.8 05/27/2017   GFRNONAA >60 08/18/2018   GFRAA >60 08/18/2018    Lab Results  Component Value Date   WBC 6.4 08/16/2018   NEUTROABS 4.9 08/16/2018   HGB 11.3 (L) 08/16/2018   HCT 32.5 (L) 08/16/2018   MCV 91.5 08/16/2018   PLT 170 08/16/2018     STUDIES: No results found.  ASSESSMENT: Stage I squamous cell  cancer of the right tonsil, p16+  PLAN:   1. 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  Patient was also discussed at tumor board. Continue daily XRT completing on August 18, 2018.  Proceed with cycle 7 of weekly cisplatin today.  Patient has now completed all of his chemotherapy.  Return to clinic in 2 months with restaging PET scan and further evaluation.   2.  Dysphasia: Chronic and unchanged.  Continue Carafate and Magic mouthwash as prescribed.   3.  Hyperglycemia: Patient's blood glucose is persistently high at 153. 4.  Renal insufficiency: Mild, encouraged continued fluid intake. 5.  Thrombocytopenia: Resolved. 6.  Neutropenia: Resolved. 7.  Hypokalemia: Repeat laboratory work 2 days later revealed potassium within normal limits.  Patient expressed understanding and was in agreement with this plan. He also understands that He can call clinic at any time with any questions, concerns, or complaints.   Cancer Staging Squamous cell carcinoma of right tonsil ([place]) Staging form: Pharynx - HPV-Mediated Oropharynx, AJCC 8th Edition - Clinical stage from 06/12/2018: Stage I (cT2, cN1, cM0, p16+) - Signed by Lloyd Huger, MD on 06/12/2018   Lloyd Huger, MD   08/19/2018 6:22 AM

## 2018-08-15 ENCOUNTER — Ambulatory Visit
Admission: RE | Admit: 2018-08-15 | Discharge: 2018-08-15 | Disposition: A | Discharge status: Home / Self Care | Payer: BC Managed Care – PPO | Source: Ambulatory Visit | Attending: Radiation Oncology | Admitting: Radiation Oncology

## 2018-08-15 ENCOUNTER — Other Ambulatory Visit: Payer: Self-pay

## 2018-08-15 DIAGNOSIS — C099 Malignant neoplasm of tonsil, unspecified: Secondary | ICD-10-CM | POA: Diagnosis not present

## 2018-08-15 DIAGNOSIS — Z87442 Personal history of urinary calculi: Secondary | ICD-10-CM | POA: Diagnosis not present

## 2018-08-15 DIAGNOSIS — Z7982 Long term (current) use of aspirin: Secondary | ICD-10-CM | POA: Diagnosis not present

## 2018-08-15 DIAGNOSIS — Z87891 Personal history of nicotine dependence: Secondary | ICD-10-CM | POA: Diagnosis not present

## 2018-08-15 DIAGNOSIS — K219 Gastro-esophageal reflux disease without esophagitis: Secondary | ICD-10-CM | POA: Diagnosis not present

## 2018-08-15 DIAGNOSIS — E785 Hyperlipidemia, unspecified: Secondary | ICD-10-CM | POA: Diagnosis not present

## 2018-08-15 DIAGNOSIS — E669 Obesity, unspecified: Secondary | ICD-10-CM | POA: Diagnosis not present

## 2018-08-15 DIAGNOSIS — Z809 Family history of malignant neoplasm, unspecified: Secondary | ICD-10-CM | POA: Diagnosis not present

## 2018-08-15 DIAGNOSIS — E119 Type 2 diabetes mellitus without complications: Secondary | ICD-10-CM | POA: Diagnosis not present

## 2018-08-15 DIAGNOSIS — Z7984 Long term (current) use of oral hypoglycemic drugs: Secondary | ICD-10-CM | POA: Diagnosis not present

## 2018-08-15 DIAGNOSIS — Z5111 Encounter for antineoplastic chemotherapy: Secondary | ICD-10-CM | POA: Diagnosis not present

## 2018-08-15 DIAGNOSIS — J439 Emphysema, unspecified: Secondary | ICD-10-CM | POA: Diagnosis not present

## 2018-08-15 DIAGNOSIS — Z79899 Other long term (current) drug therapy: Secondary | ICD-10-CM | POA: Diagnosis not present

## 2018-08-15 DIAGNOSIS — I10 Essential (primary) hypertension: Secondary | ICD-10-CM | POA: Diagnosis not present

## 2018-08-15 DIAGNOSIS — N529 Male erectile dysfunction, unspecified: Secondary | ICD-10-CM | POA: Diagnosis not present

## 2018-08-15 DIAGNOSIS — R590 Localized enlarged lymph nodes: Secondary | ICD-10-CM | POA: Diagnosis not present

## 2018-08-16 ENCOUNTER — Other Ambulatory Visit: Payer: Self-pay

## 2018-08-16 ENCOUNTER — Ambulatory Visit
Admission: RE | Admit: 2018-08-16 | Discharge: 2018-08-16 | Disposition: A | Discharge status: Home / Self Care | Payer: BC Managed Care – PPO | Source: Ambulatory Visit | Attending: Radiation Oncology | Admitting: Radiation Oncology

## 2018-08-16 ENCOUNTER — Inpatient Hospital Stay (HOSPITAL_BASED_OUTPATIENT_CLINIC_OR_DEPARTMENT_OTHER): Payer: BC Managed Care – PPO | Admitting: Oncology

## 2018-08-16 ENCOUNTER — Inpatient Hospital Stay: Payer: BC Managed Care – PPO | Attending: Oncology

## 2018-08-16 ENCOUNTER — Encounter: Payer: Self-pay | Admitting: Oncology

## 2018-08-16 ENCOUNTER — Inpatient Hospital Stay: Payer: BC Managed Care – PPO

## 2018-08-16 VITALS — BP 104/68 | HR 87 | Temp 97.3°F | Ht 71.0 in | Wt 205.0 lb

## 2018-08-16 DIAGNOSIS — J439 Emphysema, unspecified: Secondary | ICD-10-CM | POA: Diagnosis not present

## 2018-08-16 DIAGNOSIS — Z7982 Long term (current) use of aspirin: Secondary | ICD-10-CM | POA: Diagnosis not present

## 2018-08-16 DIAGNOSIS — N289 Disorder of kidney and ureter, unspecified: Secondary | ICD-10-CM | POA: Insufficient documentation

## 2018-08-16 DIAGNOSIS — Z87891 Personal history of nicotine dependence: Secondary | ICD-10-CM | POA: Diagnosis not present

## 2018-08-16 DIAGNOSIS — E669 Obesity, unspecified: Secondary | ICD-10-CM | POA: Diagnosis not present

## 2018-08-16 DIAGNOSIS — E876 Hypokalemia: Secondary | ICD-10-CM

## 2018-08-16 DIAGNOSIS — E785 Hyperlipidemia, unspecified: Secondary | ICD-10-CM | POA: Diagnosis not present

## 2018-08-16 DIAGNOSIS — E1165 Type 2 diabetes mellitus with hyperglycemia: Secondary | ICD-10-CM | POA: Insufficient documentation

## 2018-08-16 DIAGNOSIS — E119 Type 2 diabetes mellitus without complications: Secondary | ICD-10-CM | POA: Diagnosis not present

## 2018-08-16 DIAGNOSIS — C099 Malignant neoplasm of tonsil, unspecified: Secondary | ICD-10-CM

## 2018-08-16 DIAGNOSIS — Z5111 Encounter for antineoplastic chemotherapy: Secondary | ICD-10-CM | POA: Diagnosis not present

## 2018-08-16 DIAGNOSIS — Z7984 Long term (current) use of oral hypoglycemic drugs: Secondary | ICD-10-CM | POA: Diagnosis not present

## 2018-08-16 DIAGNOSIS — R590 Localized enlarged lymph nodes: Secondary | ICD-10-CM | POA: Diagnosis not present

## 2018-08-16 DIAGNOSIS — Z79899 Other long term (current) drug therapy: Secondary | ICD-10-CM | POA: Diagnosis not present

## 2018-08-16 DIAGNOSIS — I10 Essential (primary) hypertension: Secondary | ICD-10-CM | POA: Diagnosis not present

## 2018-08-16 DIAGNOSIS — Z87442 Personal history of urinary calculi: Secondary | ICD-10-CM | POA: Diagnosis not present

## 2018-08-16 DIAGNOSIS — Z809 Family history of malignant neoplasm, unspecified: Secondary | ICD-10-CM | POA: Diagnosis not present

## 2018-08-16 DIAGNOSIS — N529 Male erectile dysfunction, unspecified: Secondary | ICD-10-CM | POA: Diagnosis not present

## 2018-08-16 DIAGNOSIS — K219 Gastro-esophageal reflux disease without esophagitis: Secondary | ICD-10-CM | POA: Diagnosis not present

## 2018-08-16 LAB — CBC WITH DIFFERENTIAL/PLATELET
Abs Immature Granulocytes: 0.24 10*3/uL — ABNORMAL HIGH (ref 0.00–0.07)
Basophils Absolute: 0 10*3/uL (ref 0.0–0.1)
Basophils Relative: 1 %
Eosinophils Absolute: 0 10*3/uL (ref 0.0–0.5)
Eosinophils Relative: 0 %
HCT: 32.5 % — ABNORMAL LOW (ref 39.0–52.0)
Hemoglobin: 11.3 g/dL — ABNORMAL LOW (ref 13.0–17.0)
Immature Granulocytes: 4 %
Lymphocytes Relative: 10 %
Lymphs Abs: 0.6 10*3/uL — ABNORMAL LOW (ref 0.7–4.0)
MCH: 31.8 pg (ref 26.0–34.0)
MCHC: 34.8 g/dL (ref 30.0–36.0)
MCV: 91.5 fL (ref 80.0–100.0)
Monocytes Absolute: 0.5 10*3/uL (ref 0.1–1.0)
Monocytes Relative: 8 %
Neutro Abs: 4.9 10*3/uL (ref 1.7–7.7)
Neutrophils Relative %: 77 %
Platelets: 170 10*3/uL (ref 150–400)
RBC: 3.55 MIL/uL — ABNORMAL LOW (ref 4.22–5.81)
RDW: 15 % (ref 11.5–15.5)
WBC: 6.4 10*3/uL (ref 4.0–10.5)
nRBC: 0 % (ref 0.0–0.2)

## 2018-08-16 LAB — BASIC METABOLIC PANEL
Anion gap: 10 (ref 5–15)
BUN: 37 mg/dL — ABNORMAL HIGH (ref 8–23)
CO2: 25 mmol/L (ref 22–32)
Calcium: 9.6 mg/dL (ref 8.9–10.3)
Chloride: 103 mmol/L (ref 98–111)
Creatinine, Ser: 1.29 mg/dL — ABNORMAL HIGH (ref 0.61–1.24)
GFR calc Af Amer: >60 mL/min (ref >60)
GFR calc non Af Amer: 59 mL/min — ABNORMAL LOW (ref >60)
Glucose, Bld: 153 mg/dL — ABNORMAL HIGH (ref 70–99)
Potassium: 5.2 mmol/L — ABNORMAL HIGH (ref 3.5–5.1)
Sodium: 138 mmol/L (ref 135–145)

## 2018-08-16 MED ORDER — SODIUM CHLORIDE 0.9 % IV SOLN
40.0000 mg/m2 | Freq: Once | INTRAVENOUS | Status: AC
  Administered 2018-08-16: 92 mg via INTRAVENOUS
  Filled 2018-08-16: qty 92

## 2018-08-16 MED ORDER — SODIUM CHLORIDE 0.9 % IV SOLN
Freq: Once | INTRAVENOUS | Status: AC
  Administered 2018-08-16: 12:00:00 via INTRAVENOUS
  Filled 2018-08-16: qty 5

## 2018-08-16 MED ORDER — PALONOSETRON HCL INJECTION 0.25 MG/5ML
0.2500 mg | Freq: Once | INTRAVENOUS | Status: AC
  Administered 2018-08-16: 0.25 mg via INTRAVENOUS
  Filled 2018-08-16: qty 5

## 2018-08-16 MED ORDER — SODIUM CHLORIDE 0.9 % IV SOLN
Freq: Once | INTRAVENOUS | Status: AC
  Administered 2018-08-16: 10:00:00 via INTRAVENOUS
  Filled 2018-08-16: qty 250

## 2018-08-16 MED ORDER — MAGNESIUM SULFATE 50 % IJ SOLN
Freq: Once | INTRAVENOUS | Status: AC
  Administered 2018-08-16: 10:00:00 via INTRAVENOUS
  Filled 2018-08-16: qty 1000

## 2018-08-16 NOTE — Progress Notes (Signed)
Potassium 5.2. Per Fr. Finnegan okay to proceed with treatment, Cisplatin fluids will not include Potassium.

## 2018-08-16 NOTE — Progress Notes (Signed)
Patient stated that he had been with throat pain and not able to eat. Patient stated that he drinks 3 Ensures a day to help him get his nutrition. Patient lost 5 more pounds since last week.

## 2018-08-17 ENCOUNTER — Ambulatory Visit: Payer: BC Managed Care – PPO

## 2018-08-17 ENCOUNTER — Ambulatory Visit
Admission: RE | Admit: 2018-08-17 | Discharge: 2018-08-17 | Disposition: A | Discharge status: Home / Self Care | Payer: BC Managed Care – PPO | Source: Ambulatory Visit | Attending: Radiation Oncology | Admitting: Radiation Oncology

## 2018-08-17 ENCOUNTER — Other Ambulatory Visit: Payer: Self-pay

## 2018-08-17 DIAGNOSIS — Z87891 Personal history of nicotine dependence: Secondary | ICD-10-CM | POA: Diagnosis not present

## 2018-08-17 DIAGNOSIS — Z7982 Long term (current) use of aspirin: Secondary | ICD-10-CM | POA: Diagnosis not present

## 2018-08-17 DIAGNOSIS — E785 Hyperlipidemia, unspecified: Secondary | ICD-10-CM | POA: Diagnosis not present

## 2018-08-17 DIAGNOSIS — Z79899 Other long term (current) drug therapy: Secondary | ICD-10-CM | POA: Diagnosis not present

## 2018-08-17 DIAGNOSIS — Z5111 Encounter for antineoplastic chemotherapy: Secondary | ICD-10-CM | POA: Diagnosis not present

## 2018-08-17 DIAGNOSIS — E669 Obesity, unspecified: Secondary | ICD-10-CM | POA: Diagnosis not present

## 2018-08-17 DIAGNOSIS — N529 Male erectile dysfunction, unspecified: Secondary | ICD-10-CM | POA: Diagnosis not present

## 2018-08-17 DIAGNOSIS — Z7984 Long term (current) use of oral hypoglycemic drugs: Secondary | ICD-10-CM | POA: Diagnosis not present

## 2018-08-17 DIAGNOSIS — K219 Gastro-esophageal reflux disease without esophagitis: Secondary | ICD-10-CM | POA: Diagnosis not present

## 2018-08-17 DIAGNOSIS — R590 Localized enlarged lymph nodes: Secondary | ICD-10-CM | POA: Diagnosis not present

## 2018-08-17 DIAGNOSIS — I10 Essential (primary) hypertension: Secondary | ICD-10-CM | POA: Diagnosis not present

## 2018-08-17 DIAGNOSIS — E119 Type 2 diabetes mellitus without complications: Secondary | ICD-10-CM | POA: Diagnosis not present

## 2018-08-17 DIAGNOSIS — J439 Emphysema, unspecified: Secondary | ICD-10-CM | POA: Diagnosis not present

## 2018-08-17 DIAGNOSIS — Z809 Family history of malignant neoplasm, unspecified: Secondary | ICD-10-CM | POA: Diagnosis not present

## 2018-08-17 DIAGNOSIS — Z87442 Personal history of urinary calculi: Secondary | ICD-10-CM | POA: Diagnosis not present

## 2018-08-17 DIAGNOSIS — C099 Malignant neoplasm of tonsil, unspecified: Secondary | ICD-10-CM | POA: Diagnosis not present

## 2018-08-18 ENCOUNTER — Ambulatory Visit: Payer: BLUE CROSS/BLUE SHIELD | Admitting: Urology

## 2018-08-18 ENCOUNTER — Other Ambulatory Visit: Payer: Self-pay

## 2018-08-18 ENCOUNTER — Inpatient Hospital Stay: Payer: BC Managed Care – PPO

## 2018-08-18 ENCOUNTER — Ambulatory Visit
Admission: RE | Admit: 2018-08-18 | Discharge: 2018-08-18 | Disposition: A | Discharge status: Home / Self Care | Payer: BC Managed Care – PPO | Source: Ambulatory Visit | Attending: Radiation Oncology | Admitting: Radiation Oncology

## 2018-08-18 DIAGNOSIS — Z809 Family history of malignant neoplasm, unspecified: Secondary | ICD-10-CM | POA: Diagnosis not present

## 2018-08-18 DIAGNOSIS — Z87442 Personal history of urinary calculi: Secondary | ICD-10-CM | POA: Diagnosis not present

## 2018-08-18 DIAGNOSIS — I10 Essential (primary) hypertension: Secondary | ICD-10-CM | POA: Diagnosis not present

## 2018-08-18 DIAGNOSIS — E785 Hyperlipidemia, unspecified: Secondary | ICD-10-CM | POA: Diagnosis not present

## 2018-08-18 DIAGNOSIS — N529 Male erectile dysfunction, unspecified: Secondary | ICD-10-CM | POA: Diagnosis not present

## 2018-08-18 DIAGNOSIS — N289 Disorder of kidney and ureter, unspecified: Secondary | ICD-10-CM | POA: Diagnosis not present

## 2018-08-18 DIAGNOSIS — E119 Type 2 diabetes mellitus without complications: Secondary | ICD-10-CM | POA: Diagnosis not present

## 2018-08-18 DIAGNOSIS — Z87891 Personal history of nicotine dependence: Secondary | ICD-10-CM | POA: Diagnosis not present

## 2018-08-18 DIAGNOSIS — E669 Obesity, unspecified: Secondary | ICD-10-CM | POA: Diagnosis not present

## 2018-08-18 DIAGNOSIS — K219 Gastro-esophageal reflux disease without esophagitis: Secondary | ICD-10-CM | POA: Diagnosis not present

## 2018-08-18 DIAGNOSIS — E1165 Type 2 diabetes mellitus with hyperglycemia: Secondary | ICD-10-CM | POA: Diagnosis not present

## 2018-08-18 DIAGNOSIS — J439 Emphysema, unspecified: Secondary | ICD-10-CM | POA: Diagnosis not present

## 2018-08-18 DIAGNOSIS — Z5111 Encounter for antineoplastic chemotherapy: Secondary | ICD-10-CM | POA: Diagnosis not present

## 2018-08-18 DIAGNOSIS — R590 Localized enlarged lymph nodes: Secondary | ICD-10-CM | POA: Diagnosis not present

## 2018-08-18 DIAGNOSIS — Z7984 Long term (current) use of oral hypoglycemic drugs: Secondary | ICD-10-CM | POA: Diagnosis not present

## 2018-08-18 DIAGNOSIS — C099 Malignant neoplasm of tonsil, unspecified: Secondary | ICD-10-CM

## 2018-08-18 DIAGNOSIS — E876 Hypokalemia: Secondary | ICD-10-CM | POA: Diagnosis not present

## 2018-08-18 DIAGNOSIS — Z7982 Long term (current) use of aspirin: Secondary | ICD-10-CM | POA: Diagnosis not present

## 2018-08-18 DIAGNOSIS — Z79899 Other long term (current) drug therapy: Secondary | ICD-10-CM | POA: Diagnosis not present

## 2018-08-18 LAB — BASIC METABOLIC PANEL
Anion gap: 9 (ref 5–15)
BUN: 33 mg/dL — ABNORMAL HIGH (ref 8–23)
CO2: 25 mmol/L (ref 22–32)
Calcium: 8.9 mg/dL (ref 8.9–10.3)
Chloride: 102 mmol/L (ref 98–111)
Creatinine, Ser: 1.16 mg/dL (ref 0.61–1.24)
GFR calc Af Amer: >60 mL/min (ref >60)
GFR calc non Af Amer: >60 mL/min (ref >60)
Glucose, Bld: 91 mg/dL (ref 70–99)
Potassium: 3.7 mmol/L (ref 3.5–5.1)
Sodium: 136 mmol/L (ref 135–145)

## 2018-09-06 ENCOUNTER — Other Ambulatory Visit: Payer: Self-pay | Admitting: Radiation Oncology

## 2018-09-20 ENCOUNTER — Other Ambulatory Visit: Payer: Self-pay

## 2018-09-21 ENCOUNTER — Other Ambulatory Visit: Payer: Self-pay

## 2018-09-21 ENCOUNTER — Encounter: Payer: Self-pay | Admitting: Radiation Oncology

## 2018-09-21 ENCOUNTER — Ambulatory Visit
Admission: RE | Admit: 2018-09-21 | Discharge: 2018-09-21 | Disposition: A | Discharge status: Home / Self Care | Payer: BC Managed Care – PPO | Source: Ambulatory Visit | Attending: Radiation Oncology | Admitting: Radiation Oncology

## 2018-09-21 VITALS — BP 95/77 | HR 83 | Temp 97.1°F | Resp 16 | Wt 200.1 lb

## 2018-09-21 DIAGNOSIS — Z923 Personal history of irradiation: Secondary | ICD-10-CM | POA: Insufficient documentation

## 2018-09-21 DIAGNOSIS — Z9221 Personal history of antineoplastic chemotherapy: Secondary | ICD-10-CM | POA: Diagnosis not present

## 2018-09-21 DIAGNOSIS — C099 Malignant neoplasm of tonsil, unspecified: Secondary | ICD-10-CM | POA: Diagnosis not present

## 2018-09-21 NOTE — Progress Notes (Signed)
Radiation Oncology Follow up Note  Name: William Johns   Date:   09/21/2018 MRN:  [id number] DOB: 1955/09/16    This 63 y.o. 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  He specifically denies head and neck pain or dysphasia.  He is being followed by ENT and has an appointment next week for evaluation.  He also has a PET CT scan ordered for September..  COMPLICATIONS OF TREATMENT: none  FOLLOW UP COMPLIANCE: keeps appointments   PHYSICAL EXAM:  BP 95/77 (BP Location: Left Arm, Patient Position: Sitting)   Pulse 83   Temp (!) 97.1 F (36.2 C) (Tympanic)   Resp 16   Wt 200 lb 1.6 oz (90.8 kg)   BMI 27.91 kg/m  Neck is clear without evidence of cervical or supraclavicular adenopathy.  Well-developed well-nourished patient in NAD. HEENT reveals PERLA, EOMI, discs not visualized.  Oral cavity is clear. No oral mucosal lesions are identified. Neck is clear without evidence of cervical or supraclavicular adenopathy. Lungs are clear to A&P. Cardiac examination is essentially unremarkable with regular rate and rhythm without murmur rub or thrill. Abdomen is benign with no organomegaly or masses noted. Motor sensory and DTR levels are equal and symmetric in the upper and lower extremities. Cranial nerves II through XII are grossly intact. Proprioception is intact. No peripheral adenopathy or edema is identified. No motor or sensory levels are noted. Crude visual fields are within normal range.  RADIOLOGY RESULTS: We will review PET/CT scan in September.  PLAN: Present time patient is doing well recovering nicely  from his radiation therapy.  I am pleased with his overall progress.  He continues close follow-up care with ENT.  I have asked to see him back in 3 months for follow-up will have PET/CT at that time to evaluate.  Patient knows to call sooner with any concerns.  I would like to take this opportunity to thank you for allowing me to participate in the care of your patient.Noreene Filbert, MD

## 2018-09-22 DIAGNOSIS — H903 Sensorineural hearing loss, bilateral: Secondary | ICD-10-CM | POA: Diagnosis not present

## 2018-09-22 DIAGNOSIS — C109 Malignant neoplasm of oropharynx, unspecified: Secondary | ICD-10-CM | POA: Diagnosis not present

## 2018-09-22 DIAGNOSIS — H9319 Tinnitus, unspecified ear: Secondary | ICD-10-CM | POA: Diagnosis not present

## 2018-09-26 ENCOUNTER — Other Ambulatory Visit: Payer: Self-pay | Admitting: Family Medicine

## 2018-09-26 ENCOUNTER — Other Ambulatory Visit: Payer: Self-pay

## 2018-09-26 ENCOUNTER — Other Ambulatory Visit (INDEPENDENT_AMBULATORY_CARE_PROVIDER_SITE_OTHER): Payer: BC Managed Care – PPO

## 2018-09-26 DIAGNOSIS — Z125 Encounter for screening for malignant neoplasm of prostate: Secondary | ICD-10-CM

## 2018-09-26 DIAGNOSIS — E785 Hyperlipidemia, unspecified: Secondary | ICD-10-CM

## 2018-09-26 DIAGNOSIS — C099 Malignant neoplasm of tonsil, unspecified: Secondary | ICD-10-CM

## 2018-09-26 DIAGNOSIS — E119 Type 2 diabetes mellitus without complications: Secondary | ICD-10-CM

## 2018-09-26 LAB — COMPREHENSIVE METABOLIC PANEL
ALT: 8 U/L (ref 0–53)
AST: 13 U/L (ref 0–37)
Albumin: 3.8 g/dL (ref 3.5–5.2)
Alkaline Phosphatase: 44 U/L (ref 39–117)
BUN: 15 mg/dL (ref 6–23)
CO2: 25 mEq/L (ref 19–32)
Calcium: 9.1 mg/dL (ref 8.4–10.5)
Chloride: 109 mEq/L (ref 96–112)
Creatinine, Ser: 1.19 mg/dL (ref 0.40–1.50)
GFR: 61.73 mL/min (ref >60.00)
Glucose, Bld: 103 mg/dL — ABNORMAL HIGH (ref 70–99)
Potassium: 4.4 mEq/L (ref 3.5–5.1)
Sodium: 140 mEq/L (ref 135–145)
Total Bilirubin: 0.5 mg/dL (ref 0.2–1.2)
Total Protein: 5.8 g/dL — ABNORMAL LOW (ref 6.0–8.3)

## 2018-09-26 LAB — LIPID PANEL
Cholesterol: 128 mg/dL (ref 0–200)
HDL: 34.4 mg/dL — ABNORMAL LOW (ref >39.00)
LDL Cholesterol: 58 mg/dL (ref 0–99)
NonHDL: 93.97
Total CHOL/HDL Ratio: 4
Triglycerides: 180 mg/dL — ABNORMAL HIGH (ref 0.0–149.0)
VLDL: 36 mg/dL (ref 0.0–40.0)

## 2018-09-26 LAB — HEMOGLOBIN A1C: Hgb A1c MFr Bld: 5.2 % (ref 4.6–6.5)

## 2018-09-26 LAB — PSA: PSA: 1.19 ng/mL (ref 0.10–4.00)

## 2018-09-29 ENCOUNTER — Other Ambulatory Visit: Payer: Self-pay

## 2018-09-29 ENCOUNTER — Ambulatory Visit (INDEPENDENT_AMBULATORY_CARE_PROVIDER_SITE_OTHER): Payer: BC Managed Care – PPO | Admitting: Family Medicine

## 2018-09-29 ENCOUNTER — Encounter: Payer: Self-pay | Admitting: Family Medicine

## 2018-09-29 VITALS — BP 110/64 | HR 65 | Temp 97.7°F | Ht 70.0 in | Wt 201.4 lb

## 2018-09-29 DIAGNOSIS — Z87442 Personal history of urinary calculi: Secondary | ICD-10-CM

## 2018-09-29 DIAGNOSIS — I10 Essential (primary) hypertension: Secondary | ICD-10-CM

## 2018-09-29 DIAGNOSIS — E669 Obesity, unspecified: Secondary | ICD-10-CM

## 2018-09-29 DIAGNOSIS — E785 Hyperlipidemia, unspecified: Secondary | ICD-10-CM

## 2018-09-29 DIAGNOSIS — R3912 Poor urinary stream: Secondary | ICD-10-CM

## 2018-09-29 DIAGNOSIS — Z0001 Encounter for general adult medical examination with abnormal findings: Secondary | ICD-10-CM | POA: Diagnosis not present

## 2018-09-29 DIAGNOSIS — I700 Atherosclerosis of aorta: Secondary | ICD-10-CM

## 2018-09-29 DIAGNOSIS — C099 Malignant neoplasm of tonsil, unspecified: Secondary | ICD-10-CM

## 2018-09-29 DIAGNOSIS — I6523 Occlusion and stenosis of bilateral carotid arteries: Secondary | ICD-10-CM | POA: Insufficient documentation

## 2018-09-29 DIAGNOSIS — K219 Gastro-esophageal reflux disease without esophagitis: Secondary | ICD-10-CM

## 2018-09-29 DIAGNOSIS — R0989 Other specified symptoms and signs involving the circulatory and respiratory systems: Secondary | ICD-10-CM | POA: Diagnosis not present

## 2018-09-29 DIAGNOSIS — Z8639 Personal history of other endocrine, nutritional and metabolic disease: Secondary | ICD-10-CM

## 2018-09-29 MED ORDER — ATORVASTATIN CALCIUM 40 MG PO TABS: 40.0000 mg | ORAL_TABLET | Freq: Every day | ORAL | 3 refills | Status: DC

## 2018-09-29 MED ORDER — TAMSULOSIN HCL 0.4 MG PO CAPS: 0.4000 mg | ORAL_CAPSULE | Freq: Every day | ORAL | 3 refills | Status: DC

## 2018-09-29 NOTE — Assessment & Plan Note (Signed)
Chronic, stable.  Continue omeprazole 20 mg daily. 

## 2018-09-29 NOTE — Progress Notes (Signed)
This visit was conducted in person.  BP 110/64 (BP Location: Left Arm, Patient Position: Sitting, Cuff Size: Normal)   Pulse 65   Temp 97.7 F (36.5 C) (Temporal)   Ht 5' 10"  (1.778 m)   Wt 201 lb 6 oz (91.3 kg)   SpO2 99%   BMI 28.89 kg/m    CC: CPE Subjective:    Patient ID: William Johns, male    DOB: 03-[name]-1956, 63 y.o.   MRN: [id number]  HPI: William Johns is a 63 y.o. male presenting on 09/29/2018 for Annual Exam   Recently found tonsillar cancer s/p chemoradiation therapy completed last month. Planned PET scan 10/2018. Sees ENT/onc. 35lb weight loss with cancer treatment. Planning return to work next month.   Preventative: Colon cancer screening - 11/2008 2 hyperplastic polyps rec rpt 10 yrs William Johns).  Lung cancer screening - ~30 PY hx. Quit 2009. fmhx lung cancer. Interested - requests postponed to next year.  Prostate cancer screening - discussed -declinesscreening currently. No prostate cancer hx. Weakening of stream. Nocturia x1-2. Flu yearly Pnemovax 2014 Tdap 2013 Shingrix - discussed. interested for next year.  Seat belt use discussed Sunscreen use discussed. No changing moles on skin. 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 Interim medical history since our last visit reviewed. Allergies and medications reviewed and updated. Outpatient Medications Prior to Visit  Medication Sig Dispense Refill  . aspirin 81 MG tablet Take 81 mg by mouth daily.    . Blood Glucose Monitoring Suppl (BAYER CONTOUR MONITOR) w/Device KIT by Does not apply route. Use to check sugar daily and as needed Dx: E11.65     . glucose blood test strip 1 each by Other route as needed for other. Use as instructed to check sugar once daily and as needed. E11.65 **Contour**    . Lancets MISC by Does not apply route. Use as directed to check sugar once daily and as needed. Dx. E11.65 **Contour**    . lisinopril (ZESTRIL) 10 MG tablet TAKE 1 TABLET BY MOUTH EVERY DAY 90 tablet 0  . Multiple Vitamin (MULTIVITAMIN) tablet Take 1 tablet by mouth daily.    . OMEGA-3 FATTY ACIDS PO Take 1 capsule by mouth daily.    Marland Kitchen omeprazole (PRILOSEC OTC) 20 MG tablet Take 20 mg by mouth daily.    . sildenafil (VIAGRA) 100 MG tablet Take 100 mg by mouth daily as needed for erectile dysfunction.    Marland Kitchen atorvastatin (LIPITOR) 40 MG tablet TAKE 1 TABLET BY MOUTH EVERY DAY 90 tablet 0  . fenofibrate 160 MG tablet TAKE 1 TABLET BY MOUTH EVERY DAY 90 tablet 0  . metFORMIN (GLUCOPHAGE) 500 MG tablet TAKE 1 TABLET BY MOUTH EVERY DAY 90 tablet 0  . tamsulosin (FLOMAX) 0.4 MG CAPS capsule Take 1 capsule (0.4 mg total) by mouth daily after supper. 30 capsule 0  . docusate sodium (COLACE) 100 MG capsule Take 100 mg by mouth daily.    . ondansetron (ZOFRAN) 8 MG tablet Take 1 tablet (8 mg total) by mouth 2 (two) times daily as needed. Start on the third day after chemotherapy.  30 tablet 1  . prochlorperazine (COMPAZINE) 10 MG tablet Take 1 tablet (10 mg total) by mouth every 6 (six) hours as needed (Nausea or vomiting). 60 tablet 1   No facility-administered medications prior to visit.      Per HPI unless specifically indicated in ROS section below Review of Systems  Constitutional: Positive for fatigue (after chemo therapy). Negative for activity change, appetite change, chills, fever and unexpected weight change.  HENT: Positive for tinnitus (after chemoradiation). Negative for hearing loss.   Eyes: Negative for visual disturbance.  Respiratory: Negative for cough, chest tightness, shortness of breath and wheezing.   Cardiovascular: Negative for  chest pain, palpitations and leg swelling.  Gastrointestinal: Negative for abdominal distention, abdominal pain, blood in stool, constipation, diarrhea, nausea and vomiting.  Genitourinary: Negative for difficulty urinating and hematuria.       Weak stream  Musculoskeletal: Negative for arthralgias, myalgias and neck pain.  Skin: Negative for rash.  Neurological: Negative for dizziness, seizures, syncope and headaches.  Hematological: Negative for adenopathy. Does not bruise/bleed easily.  Psychiatric/Behavioral: Negative for dysphoric mood. The patient is not nervous/anxious.    Objective:    BP 110/64 (BP Location: Left Arm, Patient Position: Sitting, Cuff Size: Normal)   Pulse 65   Temp 97.7 F (36.5 C) (Temporal)   Ht 5' 10"  (1.778 m)   Wt 201 lb 6 oz (91.3 kg)   SpO2 99%   BMI 28.89 kg/m   Wt Readings from Last 3 Encounters:  09/29/18 201 lb 6 oz (91.3 kg)  09/21/18 200 lb 1.6 oz (90.8 kg)  08/16/18 205 lb (93 kg)    Physical Exam Vitals signs and nursing note reviewed.  Constitutional:      General: He is not in acute distress.    Appearance: Normal appearance. He is well-developed. He is not ill-appearing.  HENT:     Head: Normocephalic and atraumatic.     Right Ear: Hearing, tympanic membrane, ear canal and external ear normal.     Left Ear: Hearing, tympanic membrane, ear canal and external ear normal.     Nose: Nose normal.     Mouth/Throat:     Mouth: Mucous membranes are moist.     Pharynx: Uvula midline. No oropharyngeal exudate or posterior oropharyngeal erythema.  Eyes:     General: No scleral icterus.    Extraocular Movements: Extraocular movements intact.     Conjunctiva/sclera: Conjunctivae normal.     Pupils: Pupils are equal, round, and reactive to light.  Neck:     Musculoskeletal: Normal range of motion and neck supple.     Vascular: Carotid bruit (left) present.  Cardiovascular:     Rate and Rhythm: Normal rate and regular rhythm.     Pulses:  Normal pulses.          Radial pulses are 2+ on the right side and 2+ on the left side.     Heart sounds: Normal heart sounds. No murmur.  Pulmonary:     Effort: Pulmonary effort is normal. No respiratory distress.     Breath sounds: Normal breath sounds. No wheezing, rhonchi or rales.  Abdominal:     General: Abdomen is flat. Bowel sounds are normal. There is no distension.     Palpations: Abdomen is soft. There is no mass.     Tenderness: There is no abdominal tenderness. There is no guarding or rebound.     Hernia: No hernia is present.  Musculoskeletal: Normal range of motion.  Lymphadenopathy:  Cervical: No cervical adenopathy.  Skin:    General: Skin is warm and dry.     Findings: No rash.  Neurological:     General: No focal deficit present.     Mental Status: He is alert and oriented to person, place, and time.     Comments: CN grossly intact, station and gait intact  Psychiatric:        Mood and Affect: Mood normal.        Behavior: Behavior normal.        Thought Content: Thought content normal.        Judgment: Judgment normal.       Results for orders placed or performed in visit on 09/26/18  PSA  Result Value Ref Range   PSA 1.19 0.10 - 4.00 ng/mL  Hemoglobin A1c  Result Value Ref Range   Hgb A1c MFr Bld 5.2 4.6 - 6.5 %  Comprehensive metabolic panel  Result Value Ref Range   Sodium 140 135 - 145 mEq/L   Potassium 4.4 3.5 - 5.1 mEq/L   Chloride 109 96 - 112 mEq/L   CO2 25 19 - 32 mEq/L   Glucose, Bld 103 (H) 70 - 99 mg/dL   BUN 15 6 - 23 mg/dL   Creatinine, Ser 1.19 0.40 - 1.50 mg/dL   Total Bilirubin 0.5 0.2 - 1.2 mg/dL   Alkaline Phosphatase 44 39 - 117 U/L   AST 13 0 - 37 U/L   ALT 8 0 - 53 U/L   Total Protein 5.8 (L) 6.0 - 8.3 g/dL   Albumin 3.8 3.5 - 5.2 g/dL   Calcium 9.1 8.4 - 10.5 mg/dL   GFR 61.73 >60.00 mL/min  Lipid panel  Result Value Ref Range   Cholesterol 128 0 - 200 mg/dL   Triglycerides 180.0 (H) 0.0 - 149.0 mg/dL   HDL 34.40  (L) >39.00 mg/dL   VLDL 36.0 0.0 - 40.0 mg/dL   LDL Cholesterol 58 0 - 99 mg/dL   Total CHOL/HDL Ratio 4    NonHDL 93.97    Assessment & Plan:   Problem List Items Addressed This Visit    Weak urinary stream    Restart flomax which has previously used with benefit. PSA reassuring.       Squamous cell carcinoma of right tonsil (HCC)    Appreciate onc/ENT care. Has completed chemoradiation therapy. Planned PET next month.       Obesity, Class I, BMI 30-34.9    Reviewed weight loss noted in setting of recently completing chemoradiation therapy for his tonsillar cancer. Endorses appetite slowly returning.       Left carotid bruit    Newly noted - check carotid US      Relevant Orders   VAS US CAROTID   HTN (hypertension)    Chronic, stable. Continue current regimen.       Relevant Medications   atorvastatin (LIPITOR) 40 MG tablet   History of kidney stones   History of diabetes mellitus, type II    This has improved with weight loss during cancer treatment. Suggested hold metformin at this time, reassess at 6 mo f/u visit.       GERD (gastroesophageal reflux disease)    Chronic, stable. Continue omeprazole 62m daily.       Encounter for general adult medical examination with abnormal findings - Primary    Preventative protocols reviewed and updated unless pt declined. Discussed healthy diet and lifestyle.       Dyslipidemia  Relevant Medications   atorvastatin (LIPITOR) 40 MG tablet   Abdominal aortic atherosclerosis ([place])    Reviewed with patient. Continue statin.      Relevant Medications   atorvastatin (LIPITOR) 40 MG tablet       Meds ordered this encounter  Medications  . tamsulosin (FLOMAX) 0.4 MG CAPS capsule    Sig: Take 1 capsule (0.4 mg total) by mouth daily after supper.    Dispense:  30 capsule    Refill:  3  . DISCONTD: atorvastatin (LIPITOR) 40 MG tablet    Sig: Take 1 tablet (40 mg total) by mouth daily.    Dispense:  90 tablet     Refill:  3  . atorvastatin (LIPITOR) 40 MG tablet    Sig: Take 1 tablet (40 mg total) by mouth daily.    Dispense:  90 tablet    Refill:  3    Stop fenofibrate   No orders of the defined types were placed in this encounter.   Patient instructions: Stop metformin Stop fenofibrate, continue lipitor 40m (atorvastatin) daily. You are doing well today. Return in 6 months for follow up visit.   Follow up plan: Return in about 6 months (around 04/01/2019) for follow up visit.  JRia Bush MD

## 2018-09-29 NOTE — Patient Instructions (Addendum)
Stop metformin Stop fenofibrate, continue lipitor 40mg  (atorvastatin) daily. You are doing well today. Return in 6 months for follow up visit.   Health Maintenance, Male Adopting a healthy lifestyle and getting preventive care are important in promoting health and wellness. Ask your health care provider about:  The right schedule for you to have regular tests and exams.  Things you can do on your own to prevent diseases and keep yourself healthy. What should I know about diet, weight, and exercise? Eat a healthy diet   Eat a diet that includes plenty of vegetables, fruits, low-fat dairy products, and lean protein.  Do not eat a lot of foods that are high in solid fats, added sugars, or sodium. Maintain a healthy weight Body mass index (BMI) is a measurement that can be used to identify possible weight problems. It estimates body fat based on height and weight. Your health care provider can help determine your BMI and help you achieve or maintain a healthy weight. Get regular exercise Get regular exercise. This is one of the most important things you can do for your health. Most adults should:  Exercise for at least 150 minutes each week. The exercise should increase your heart rate and make you sweat (moderate-intensity exercise).  Do strengthening exercises at least twice a week. This is in addition to the moderate-intensity exercise.  Spend less time sitting. Even light physical activity can be beneficial. Watch cholesterol and blood lipids Have your blood tested for lipids and cholesterol at 63 years of age, then have this test every 5 years. You may need to have your cholesterol levels checked more often if:  Your lipid or cholesterol levels are high.  You are older than 63 years of age.  You are at high risk for heart disease. What should I know about cancer screening? Many types of cancers can be detected early and may often be prevented. Depending on your health history  and family history, you may need to have cancer screening at various ages. This may include screening for:  Colorectal cancer.  Prostate cancer.  Skin cancer.  Lung cancer. What should I know about heart disease, diabetes, and high blood pressure? Blood pressure and heart disease  High blood pressure causes heart disease and increases the risk of stroke. This is more likely to develop in people who have high blood pressure readings, are of African descent, or are overweight.  Talk with your health care provider about your target blood pressure readings.  Have your blood pressure checked: ? Every 3-5 years if you are 16-28 years of age. ? Every year if you are 71 years old or older.  If you are between the ages of 3 and 84 and are a current or former smoker, ask your health care provider if you should have a one-time screening for abdominal aortic aneurysm (AAA). Diabetes Have regular diabetes screenings. This checks your fasting blood sugar level. Have the screening done:  Once every three years after age 66 if you are at a normal weight and have a low risk for diabetes.  More often and at a younger age if you are overweight or have a high risk for diabetes. What should I know about preventing infection? Hepatitis B If you have a higher risk for hepatitis B, you should be screened for this virus. Talk with your health care provider to find out if you are at risk for hepatitis B infection. Hepatitis C Blood testing is recommended for:  Everyone born  from [place] through 1965.  Anyone with known risk factors for hepatitis C. Sexually transmitted infections (STIs)  You should be screened each year for STIs, including gonorrhea and chlamydia, if: ? You are sexually active and are younger than 63 years of age. ? You are older than 63 years of age and your health care provider tells you that you are at risk for this type of infection. ? Your sexual activity has changed since you were  last screened, and you are at increased risk for chlamydia or gonorrhea. Ask your health care provider if you are at risk.  Ask your health care provider about whether you are at high risk for HIV. Your health care provider may recommend a prescription medicine to help prevent HIV infection. If you choose to take medicine to prevent HIV, you should first get tested for HIV. You should then be tested every 3 months for as long as you are taking the medicine. Follow these instructions at home: Lifestyle  Do not use any products that contain nicotine or tobacco, such as cigarettes, e-cigarettes, and chewing tobacco. If you need help quitting, ask your health care provider.  Do not use street drugs.  Do not share needles.  Ask your health care provider for help if you need support or information about quitting drugs. Alcohol use  Do not drink alcohol if your health care provider tells you not to drink.  If you drink alcohol: ? Limit how much you have to 0-2 drinks a day. ? Be aware of how much alcohol is in your drink. In the [place] one drink equals one 12 oz bottle of beer (355 mL), one 5 oz glass of wine (148 mL), or one 1 oz glass of hard liquor (44 mL). General instructions  Schedule regular health, dental, and eye exams.  Stay current with your vaccines.  Tell your health care provider if: ? You often feel depressed. ? You have ever been abused or do not feel safe at home. Summary  Adopting a healthy lifestyle and getting preventive care are important in promoting health and wellness.  Follow your health care provider's instructions about healthy diet, exercising, and getting tested or screened for diseases.  Follow your health care provider's instructions on monitoring your cholesterol and blood pressure. This information is not intended to replace advice given to you by your health care provider. Make sure you discuss any questions you have with your health care  provider. Document Released: 07/25/2007 Document Revised: 01/19/2018 Document Reviewed: 01/19/2018 Elsevier Patient Education  2020 [employer].

## 2018-09-29 NOTE — Assessment & Plan Note (Signed)
Preventative protocols reviewed and updated unless pt declined. Discussed healthy diet and lifestyle.  

## 2018-09-29 NOTE — Assessment & Plan Note (Signed)
Newly noted - check carotid US

## 2018-09-29 NOTE — Assessment & Plan Note (Signed)
Appreciate onc/ENT care. Has completed chemoradiation therapy. Planned PET next month.

## 2018-09-29 NOTE — Assessment & Plan Note (Signed)
Reviewed weight loss noted in setting of recently completing chemoradiation therapy for his tonsillar cancer. Endorses appetite slowly returning.

## 2018-09-29 NOTE — Assessment & Plan Note (Signed)
Reviewed with patient. Continue statin.

## 2018-09-29 NOTE — Assessment & Plan Note (Signed)
Restart flomax which has previously used with benefit. PSA reassuring.

## 2018-09-29 NOTE — Assessment & Plan Note (Signed)
Chronic, stable. Continue current regimen. 

## 2018-09-29 NOTE — Assessment & Plan Note (Signed)
This has improved with weight loss during cancer treatment. Suggested hold metformin at this time, reassess at 6 mo f/u visit.

## 2018-10-13 ENCOUNTER — Ambulatory Visit (INDEPENDENT_AMBULATORY_CARE_PROVIDER_SITE_OTHER): Payer: BC Managed Care – PPO

## 2018-10-13 ENCOUNTER — Other Ambulatory Visit: Payer: Self-pay

## 2018-10-13 DIAGNOSIS — R0989 Other specified symptoms and signs involving the circulatory and respiratory systems: Secondary | ICD-10-CM

## 2018-10-14 ENCOUNTER — Other Ambulatory Visit: Payer: Self-pay | Admitting: *Deleted

## 2018-10-14 DIAGNOSIS — C099 Malignant neoplasm of tonsil, unspecified: Secondary | ICD-10-CM

## 2018-10-14 NOTE — Progress Notes (Signed)
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  INTERVAL HISTORY: Patient returns to clinic today for further evaluation and discussion of his imaging results.  He no longer has dysphasia.  He currently feels well and is asymptomatic. He has no neurologic complaints.  He denies any weakness or fatigue.  He denies any recent fevers or illnesses. He has no chest pain, shortness of breath, cough, or hemoptysis.  He has no nausea, vomiting, constipation, or diarrhea.  He has no urinary complaints.  Patient feels at his baseline offers no specific complaints today.  REVIEW OF SYSTEMS:   Review of Systems  Constitutional: Negative.  Negative for fever, malaise/fatigue and weight loss.  HENT: Negative for sore throat.   Respiratory: Negative.  Negative for cough and shortness of breath.   Cardiovascular: Negative.  Negative for chest pain and leg swelling.  Gastrointestinal: Negative.  Negative for abdominal pain.  Genitourinary: Negative.  Negative for dysuria.  Musculoskeletal: Negative.  Negative for back pain.  Skin: Negative.  Negative for rash.  Neurological: Negative.  Negative for speech change, weakness and headaches.  Psychiatric/Behavioral: Negative.  The patient is not nervous/anxious.     As per HPI. Otherwise, a complete review of systems is negative.  PAST MEDICAL HISTORY: Past Medical History:  Diagnosis Date   ED (erectile dysfunction)    GERD (gastroesophageal reflux disease)    History of kidney stones 2013   s/p lithotripsy   HLD (hyperlipidemia)    HTN (hypertension)    Obesity    Squamous cell carcinoma of right tonsil ([place]) 05/2018   Chemo + rad tx's.    T2DM (type 2 diabetes mellitus) ([place])    DSME at [facility]  01/2012   Tonsillar mass 05/23/2018    PAST SURGICAL HISTORY: Past Surgical History:  Procedure Laterality Date   CHOLECYSTECTOMY  2007   COLONOSCOPY  11/2008   2 hyperplastic polyps [name])   EXTRACORPOREAL SHOCK WAVE LITHOTRIPSY Left 04/28/2018   Procedure: EXTRACORPOREAL SHOCK WAVE LITHOTRIPSY (ESWL);  Surgeon: Billey Co, MD;  Location: [facility] ORS;  Service: Urology;  Laterality: Left;   LITHOTRIPSY  2013   x 2    FAMILY HISTORY: Family History  Problem Relation Age of Onset   Cancer Father 52       lung, smoker   Aneurysm Mother 9       brain   Coronary artery disease Neg Hx    Stroke Neg Hx    Diabetes Neg Hx     ADVANCED DIRECTIVES (Y/N):  N  HEALTH MAINTENANCE: Social History   Tobacco Use   Smoking status: Former Smoker    Quit date: 02/10/2007    Years since quitting: 11.7   Smokeless tobacco: Never Used  Substance Use Topics   Alcohol use: Yes    Alcohol/week: 0.0 standard drinks    Comment: 5 drinks/week    Drug use: Yes    Comment: rare MJ     Colonoscopy:  PAP:  Bone density:  Lipid panel:  Allergies  Allergen Reactions   Oxycodone Nausea And Vomiting    Current Outpatient Medications  Medication Sig Dispense Refill   aspirin 81 MG tablet Take 81 mg by mouth daily.     atorvastatin (LIPITOR) 40 MG tablet Take 1 tablet (40 mg  total) by mouth daily. 90 tablet 3   Blood Glucose Monitoring Suppl (BAYER CONTOUR MONITOR) w/Device KIT by Does not apply route. Use to check sugar daily and as needed Dx: E11.65     glucose blood test strip 1 each by Other route as needed for other. Use as instructed to check sugar once daily and as needed. E11.65 **Contour**     Lancets MISC by Does not apply route. Use as directed to check sugar once daily and as needed. Dx. E11.65 **Contour**     lisinopril (ZESTRIL) 10 MG tablet TAKE 1 TABLET BY MOUTH EVERY DAY 90 tablet 0   Multiple Vitamin (MULTIVITAMIN) tablet Take 1 tablet by mouth  daily.     OMEGA-3 FATTY ACIDS PO Take 1 capsule by mouth daily.     omeprazole (PRILOSEC OTC) 20 MG tablet Take 20 mg by mouth daily.     sildenafil (VIAGRA) 100 MG tablet Take 100 mg by mouth daily as needed for erectile dysfunction.     tamsulosin (FLOMAX) 0.4 MG CAPS capsule Take 1 capsule (0.4 mg total) by mouth daily after supper. 30 capsule 3   No current facility-administered medications for this visit.     OBJECTIVE: Vitals:   10/27/18 1003  BP: 112/73  Pulse: 66  Temp: (!) 97.5 F (36.4 C)     Body mass index is 28.98 kg/m.    ECOG FS:0 - Asymptomatic  General: Well-developed, well-nourished, no acute distress. Eyes: Pink conjunctiva, anicteric sclera. HEENT: Normocephalic, moist mucous membranes, clear oropharnyx.  No palpable lymphadenopathy. Lungs: Clear to auscultation bilaterally. Heart: Regular rate and rhythm. No rubs, murmurs, or gallops. Abdomen: Soft, nontender, nondistended. No organomegaly noted, normoactive bowel sounds. Musculoskeletal: No edema, cyanosis, or clubbing. Neuro: Alert, answering all questions appropriately. Cranial nerves grossly intact. Skin: No rashes or petechiae noted. Psych: Normal affect.  LAB RESULTS:  Lab Results  Component Value Date   NA 138 10/26/2018   K 3.8 10/26/2018   CL 107 10/26/2018   CO2 22 10/26/2018   GLUCOSE 92 10/26/2018   BUN 20 10/26/2018   CREATININE 1.15 10/26/2018   CALCIUM 9.3 10/26/2018   PROT 7.2 10/26/2018   ALBUMIN 4.2 10/26/2018   AST 21 10/26/2018   ALT 11 10/26/2018   ALKPHOS 59 10/26/2018   BILITOT 1.0 10/26/2018   GFRNONAA >60 10/26/2018   GFRAA >60 10/26/2018    Lab Results  Component Value Date   WBC 5.6 10/26/2018   NEUTROABS 4.3 10/26/2018   HGB 11.4 (L) 10/26/2018   HCT 34.8 (L) 10/26/2018   MCV 99.1 10/26/2018   PLT 142 (L) 10/26/2018     STUDIES: Ct Soft Tissue Neck W Contrast  Result Date: 10/27/2018 CLINICAL DATA:  Follow-up head neck cancer. EXAM: CT NECK WITH  CONTRAST TECHNIQUE: Multidetector CT imaging of the neck was performed using the standard protocol following the bolus administration of intravenous contrast. CONTRAST:  4m OMNIPAQUE IOHEXOL 300 MG/ML  SOLN COMPARISON:  05/23/2018 FINDINGS: Pharynx and larynx: No residual masslike thickening at the right tonsillar fossa. There is submucosal low-density mucoid appearance correlating with history of radiotherapy Salivary glands: Small nodular densities in the left parotid are stable and likely normal lymph nodes. Thyroid: Normal. Lymph nodes: Normalized size and appearance of right-sided lymph node Vascular: Carotid bifurcation atherosclerotic plaque with mild to moderate left proximal ICA stenosis. Limited intracranial: Negative Visualized orbits: Negative Mastoids and visualized paranasal sinuses: Clear Skeleton: Torus mandibularis and maxillaris. No acute or destructive finding. Cervical spine degeneration. Upper chest:  Emphysema. IMPRESSION: No evidence of residual disease in the neck. Electronically Signed   By: Monte Fantasia M.D.   On: 10/27/2018 08:32   Vas US Carotid  Result Date: 10/13/2018 Carotid Arterial Duplex Study Indications:       Left bruit. Risk Factors:      Hypertension, hyperlipidemia, past history of smoking. Other Factors:     Patient denied all cerebrovascular symptoms. Comparison Study:  None Performing Technologist: Pilar Jarvis RDMS, RVT, RDCS  Examination Guidelines: A complete evaluation includes B-mode imaging, spectral Doppler, color Doppler, and power Doppler as needed of all accessible portions of each vessel. Bilateral testing is considered an integral part of a complete examination. Limited examinations for reoccurring indications may be performed as noted.  Right Carotid Findings: +----------+--------+--------+--------+------------------+--------+             PSV cm/s EDV cm/s Stenosis Plaque Description Comments   +----------+--------+--------+--------+------------------+--------+  CCA Prox   111      33                                             +----------+--------+--------+--------+------------------+--------+  CCA Distal 106      39                                             +----------+--------+--------+--------+------------------+--------+  ICA Prox   88       36       1-39%    focal                        +----------+--------+--------+--------+------------------+--------+  ICA Mid    78       33                                             +----------+--------+--------+--------+------------------+--------+  ICA Distal 81       37                                             +----------+--------+--------+--------+------------------+--------+  ECA        111      26                                             +----------+--------+--------+--------+------------------+--------+ +----------+--------+-------+----------------+-------------------+             PSV cm/s EDV cms Describe         Arm Pressure (mmHG)  +----------+--------+-------+----------------+-------------------+  Subclavian 178              Multiphasic, WNL 116                  +----------+--------+-------+----------------+-------------------+ +---------+--------+--+--------+--+---------+  Vertebral PSV cm/s 53 EDV cm/s 18 Antegrade  +---------+--------+--+--------+--+---------+ Very minimal plaque seen Left Carotid Findings: +----------+--------+--------+--------+-------------------------------+--------+             PSV cm/s EDV cm/s Stenosis Plaque Description  Comments  +----------+--------+--------+--------+-------------------------------+--------+  CCA Prox   103      30                                                          +----------+--------+--------+--------+-------------------------------+--------+  CCA Distal 81       36                                                           +----------+--------+--------+--------+-------------------------------+--------+  ICA Prox   146      63       40-59%   focal, hyperechoic and                                                           irregular                                 +----------+--------+--------+--------+-------------------------------+--------+  ICA Mid    109      43                                                          +----------+--------+--------+--------+-------------------------------+--------+  ICA Distal 107      53                                                          +----------+--------+--------+--------+-------------------------------+--------+  ECA        66       20                                                          +----------+--------+--------+--------+-------------------------------+--------+ +----------+--------+--------+----------------+-------------------+             PSV cm/s EDV cm/s Describe         Arm Pressure (mmHG)  +----------+--------+--------+----------------+-------------------+  Subclavian 94                Multiphasic, WNL 118                  +----------+--------+--------+----------------+-------------------+ +---------+--------+--+--------+--+---------+  Vertebral PSV cm/s 62 EDV cm/s 19 Antegrade  +---------+--------+--+--------+--+---------+  Summary: Right Carotid: Non-hemodynamically significant plaque <50% noted in the CCA. The                ECA appears <50% stenosed. The extracranial vessels were  near-normal with only minimal wall thickening or plaque. Left Carotid: Velocities in the left ICA are consistent with a 40-59% stenosis.               Non-hemodynamically significant plaque <50% noted in the CCA. The               ECA appears <50% stenosed. Vertebrals:  Bilateral vertebral arteries demonstrate antegrade flow. Subclavians: Normal flow hemodynamics were seen in bilateral subclavian              arteries. *See table(s) above for measurements and observations.  Suggest follow up study in 12 months. Electronically signed by Larae Grooms MD on 10/13/2018 at 8:48:19 AM.    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  CT results from October 26, 2018 reviewed independently and reported as above with no obvious evidence of recurrent or progressive disease.  He also reports a normal endoscopy by ENT recently.  No intervention is needed at this time.  Return to clinic in 3 months with repeat laboratory work and further evaluation. 2.  Dysphasia: Resolved. 3.  Hyperglycemia: Resolved. 4.  Renal insufficiency: Resolved. 5.  Thrombocytopenia: Mild, monitor. 6.  Neutropenia: Resolved. 7.  Hypokalemia: Resolved. 8.  Anemia: Mild, monitor. 9.  Tinnitus: Possibly residual from cisplatin, monitor.  Patient expressed understanding and was in agreement with this plan. He also understands that He can call clinic at any time with any questions, concerns, or complaints.   Cancer Staging Squamous cell carcinoma of right tonsil ([place]) Staging form: Pharynx - HPV-Mediated Oropharynx, AJCC 8th Edition - Clinical stage from 06/12/2018: Stage I (cT2, cN1, cM0, p16+) - Signed by Lloyd Huger, MD on 06/12/2018   Lloyd Huger, MD   10/27/2018 12:22 PM

## 2018-10-16 ENCOUNTER — Other Ambulatory Visit: Payer: Self-pay | Admitting: Family Medicine

## 2018-10-17 ENCOUNTER — Encounter: Payer: Self-pay | Admitting: Family Medicine

## 2018-10-18 ENCOUNTER — Inpatient Hospital Stay: Payer: BC Managed Care – PPO

## 2018-10-18 ENCOUNTER — Ambulatory Visit: Payer: BC Managed Care – PPO

## 2018-10-19 ENCOUNTER — Other Ambulatory Visit: Payer: Self-pay | Admitting: Family Medicine

## 2018-10-20 ENCOUNTER — Inpatient Hospital Stay: Payer: BC Managed Care – PPO | Admitting: Oncology

## 2018-10-26 ENCOUNTER — Other Ambulatory Visit: Payer: Self-pay

## 2018-10-26 ENCOUNTER — Ambulatory Visit
Admission: RE | Admit: 2018-10-26 | Discharge: 2018-10-26 | Disposition: A | Discharge status: Home / Self Care | Payer: BC Managed Care – PPO | Source: Ambulatory Visit | Attending: Oncology | Admitting: Oncology

## 2018-10-26 ENCOUNTER — Inpatient Hospital Stay: Payer: BC Managed Care – PPO | Attending: Oncology

## 2018-10-26 DIAGNOSIS — C099 Malignant neoplasm of tonsil, unspecified: Secondary | ICD-10-CM | POA: Insufficient documentation

## 2018-10-26 DIAGNOSIS — D649 Anemia, unspecified: Secondary | ICD-10-CM | POA: Diagnosis not present

## 2018-10-26 DIAGNOSIS — D696 Thrombocytopenia, unspecified: Secondary | ICD-10-CM | POA: Insufficient documentation

## 2018-10-26 DIAGNOSIS — C760 Malignant neoplasm of head, face and neck: Secondary | ICD-10-CM | POA: Diagnosis not present

## 2018-10-26 LAB — CBC WITH DIFFERENTIAL/PLATELET
Abs Immature Granulocytes: 0.02 10*3/uL (ref 0.00–0.07)
Basophils Absolute: 0 10*3/uL (ref 0.0–0.1)
Basophils Relative: 1 %
Eosinophils Absolute: 0.2 10*3/uL (ref 0.0–0.5)
Eosinophils Relative: 3 %
HCT: 34.8 % — ABNORMAL LOW (ref 39.0–52.0)
Hemoglobin: 11.4 g/dL — ABNORMAL LOW (ref 13.0–17.0)
Immature Granulocytes: 0 %
Lymphocytes Relative: 11 %
Lymphs Abs: 0.6 10*3/uL — ABNORMAL LOW (ref 0.7–4.0)
MCH: 32.5 pg (ref 26.0–34.0)
MCHC: 32.8 g/dL (ref 30.0–36.0)
MCV: 99.1 fL (ref 80.0–100.0)
Monocytes Absolute: 0.4 10*3/uL (ref 0.1–1.0)
Monocytes Relative: 8 %
Neutro Abs: 4.3 10*3/uL (ref 1.7–7.7)
Neutrophils Relative %: 77 %
Platelets: 142 10*3/uL — ABNORMAL LOW (ref 150–400)
RBC: 3.51 MIL/uL — ABNORMAL LOW (ref 4.22–5.81)
RDW: 12.8 % (ref 11.5–15.5)
WBC: 5.6 10*3/uL (ref 4.0–10.5)
nRBC: 0 % (ref 0.0–0.2)

## 2018-10-26 LAB — COMPREHENSIVE METABOLIC PANEL
ALT: 11 U/L (ref 0–44)
AST: 21 U/L (ref 15–41)
Albumin: 4.2 g/dL (ref 3.5–5.0)
Alkaline Phosphatase: 59 U/L (ref 38–126)
Anion gap: 9 (ref 5–15)
BUN: 20 mg/dL (ref 8–23)
CO2: 22 mmol/L (ref 22–32)
Calcium: 9.3 mg/dL (ref 8.9–10.3)
Chloride: 107 mmol/L (ref 98–111)
Creatinine, Ser: 1.15 mg/dL (ref 0.61–1.24)
GFR calc Af Amer: >60 mL/min (ref >60)
GFR calc non Af Amer: >60 mL/min (ref >60)
Glucose, Bld: 92 mg/dL (ref 70–99)
Potassium: 3.8 mmol/L (ref 3.5–5.1)
Sodium: 138 mmol/L (ref 135–145)
Total Bilirubin: 1 mg/dL (ref 0.3–1.2)
Total Protein: 7.2 g/dL (ref 6.5–8.1)

## 2018-10-26 MED ORDER — IOHEXOL 300 MG/ML  SOLN
75.0000 mL | Freq: Once | INTRAMUSCULAR | Status: AC | PRN
  Administered 2018-10-26: 75 mL via INTRAVENOUS

## 2018-10-26 NOTE — Progress Notes (Signed)
Pre-visit assessment for onc clinic on 10/27/2018 with Dr. Grayland Ormond.  Pt reports that he is feeling well. He is having a repeat CT scan this afternoon. His only concerns are as follows:  1. He has had constant ringing in his ears since starting chemo. The ringing varies in severity but is always present.  2. He also reports that he has intermittent swelling and hardness in the hand veins that were used to administer his chemo. He states that they do not cause pain, but he wants to make sure that it is normal and nothing to be concerned about.

## 2018-10-27 ENCOUNTER — Other Ambulatory Visit: Payer: Self-pay

## 2018-10-27 ENCOUNTER — Inpatient Hospital Stay (HOSPITAL_BASED_OUTPATIENT_CLINIC_OR_DEPARTMENT_OTHER): Payer: BC Managed Care – PPO | Admitting: Oncology

## 2018-10-27 VITALS — BP 112/73 | HR 66 | Temp 97.5°F | Wt 202.0 lb

## 2018-10-27 DIAGNOSIS — D649 Anemia, unspecified: Secondary | ICD-10-CM | POA: Diagnosis not present

## 2018-10-27 DIAGNOSIS — D696 Thrombocytopenia, unspecified: Secondary | ICD-10-CM | POA: Diagnosis not present

## 2018-10-27 DIAGNOSIS — C099 Malignant neoplasm of tonsil, unspecified: Secondary | ICD-10-CM | POA: Diagnosis not present

## 2018-10-27 LAB — THYROID PANEL WITH TSH
Free Thyroxine Index: 1.6 (ref 1.2–4.9)
T3 Uptake Ratio: 22 % — ABNORMAL LOW (ref 24–39)
T4, Total: 7.3 ug/dL (ref 4.5–12.0)
TSH: 0.515 u[IU]/mL (ref 0.450–4.500)

## 2018-11-14 ENCOUNTER — Encounter: Payer: Self-pay | Admitting: Gastroenterology

## 2018-12-14 ENCOUNTER — Telehealth: Payer: Self-pay

## 2018-12-14 ENCOUNTER — Other Ambulatory Visit: Payer: Self-pay | Admitting: Family Medicine

## 2018-12-14 NOTE — Telephone Encounter (Signed)
Telephone call to patient to discuss SCP visit.  Patient in agreement for me to mail him SCP packet and I will call him 12/26/2018 to review information and discuss post cancer care.   Packet mailed.

## 2018-12-23 ENCOUNTER — Other Ambulatory Visit: Payer: Self-pay

## 2018-12-25 ENCOUNTER — Other Ambulatory Visit: Payer: Self-pay | Admitting: Family Medicine

## 2018-12-26 ENCOUNTER — Encounter: Payer: Self-pay | Admitting: Radiation Oncology

## 2018-12-26 ENCOUNTER — Ambulatory Visit
Admission: RE | Admit: 2018-12-26 | Discharge: 2018-12-26 | Disposition: A | Discharge status: Home / Self Care | Payer: BC Managed Care – PPO | Source: Ambulatory Visit | Attending: Radiation Oncology | Admitting: Radiation Oncology

## 2018-12-26 ENCOUNTER — Other Ambulatory Visit: Payer: Self-pay

## 2018-12-26 ENCOUNTER — Inpatient Hospital Stay: Payer: BC Managed Care – PPO | Attending: Oncology | Admitting: Nurse Practitioner

## 2018-12-26 ENCOUNTER — Inpatient Hospital Stay: Payer: BC Managed Care – PPO

## 2018-12-26 ENCOUNTER — Telehealth: Payer: Self-pay

## 2018-12-26 VITALS — HR 73 | Temp 95.7°F | Resp 18 | Wt 199.2 lb

## 2018-12-26 DIAGNOSIS — J300 Vasomotor rhinitis: Secondary | ICD-10-CM | POA: Diagnosis not present

## 2018-12-26 DIAGNOSIS — Z85819 Personal history of malignant neoplasm of unspecified site of lip, oral cavity, and pharynx: Secondary | ICD-10-CM | POA: Diagnosis not present

## 2018-12-26 DIAGNOSIS — C099 Malignant neoplasm of tonsil, unspecified: Secondary | ICD-10-CM

## 2018-12-26 DIAGNOSIS — Z923 Personal history of irradiation: Secondary | ICD-10-CM | POA: Diagnosis not present

## 2018-12-26 DIAGNOSIS — Z85818 Personal history of malignant neoplasm of other sites of lip, oral cavity, and pharynx: Secondary | ICD-10-CM | POA: Diagnosis not present

## 2018-12-26 NOTE — Progress Notes (Signed)
Radiation Oncology Follow up Note  Name: William Johns   Date:   12/26/2018 MRN:  [id number] DOB: 09-25-1955    This 63 y.o. male presents to the clinic today for 74-month follow-up status post concurrent chemoradiation therapy for stage II (T2 N1 M0) squamous cell carcinoma the right tonsil p16 positive.Marland Kitchen  REFERRING PROVIDER: Ria Bush, MD  HPI: Patient is a 63 year old male now out 4 months having completed concurrent chemoradiation therapy for stage II squamous cell carcinoma of the right tonsil p16 positive seen today in routine follow-up he is doing well.  He specifically denies head and neck pain dysphagia.  He had a CT scan back.  In September showing no evidence of disease.  He had a follow-up appointment with Dr. Tami Johns about 2 months ago again showing no evidence of disease on exam.  COMPLICATIONS OF TREATMENT: none  FOLLOW UP COMPLIANCE: keeps appointments   PHYSICAL EXAM:  Pulse 73   Temp (!) 95.7 F (35.4 C) (Tympanic)   Resp 18   Wt 199 lb 3.2 oz (90.4 kg)   BMI 28.58 kg/m  Tonsillar regions are within normal range oral cavity shows no evidence of pathology.  Neck is clear without evidence of cervical or supraclavicular adenopathy.  Well-developed well-nourished patient in NAD. HEENT reveals PERLA, EOMI, discs not visualized.  Oral cavity is clear. No oral mucosal lesions are identified. Neck is clear without evidence of cervical or supraclavicular adenopathy. Lungs are clear to A&P. Cardiac examination is essentially unremarkable with regular rate and rhythm without murmur rub or thrill. Abdomen is benign with no organomegaly or masses noted. Motor sensory and DTR levels are equal and symmetric in the upper and lower extremities. Cranial nerves II through XII are grossly intact. Proprioception is intact. No peripheral adenopathy or edema is identified. No motor or sensory levels are noted. Crude visual fields are within normal range.  RADIOLOGY RESULTS: CT scan reviewed  compatible with above-stated findings  PLAN: Present time patient is doing well 4 months out with no evidence of disease.  He continues close follow-up care with Dr. Tami Johns in ENT.  I have asked to see him back in 6 months for follow-up.  Patient knows to call at anytime with any concerns.  I would like to take this opportunity to thank you for allowing me to participate in the care of your patient.William Filbert, MD

## 2018-12-26 NOTE — Telephone Encounter (Signed)
Telephone call to patient to discuss SCP and treatment summary.   Patient apologized but did not want to talk to APP prior to my call.   Patient states he saw Dr. Baruch Gouty this morning and everything was good and did not feel he needed to talk to APP.   Patient did allow me to review his Treatment Summary and talk about post cancer care.  I discussed Cancer Transitions and CARE program.  Patient states he is working full time and unable to participate in any classes we have at this time.  States he has my card I attached in the SCP packet and he will call me for any questions or resources he may need in the future.  We did discuss nutrition and exercise post cancer treatment and patient states he is eating healthy and staying very busy. Denies any side effects from chemotherapy or radiation.   Encouraged patient to call for any questions or if he needs any assistance.

## 2018-12-26 NOTE — Progress Notes (Signed)
No show

## 2018-12-27 ENCOUNTER — Ambulatory Visit (INDEPENDENT_AMBULATORY_CARE_PROVIDER_SITE_OTHER): Payer: BC Managed Care – PPO

## 2018-12-27 DIAGNOSIS — Z23 Encounter for immunization: Secondary | ICD-10-CM | POA: Diagnosis not present

## 2019-01-22 NOTE — Progress Notes (Signed)
[place]  [phone] Fax:(336) [phone]  ID: William Johns OB: 07/05/1955  MR#: [id number]  ZSW#:[id number]  Patient Care Team: Ria Bush, MD as PCP - General (Family Medicine) Beverly Gust, MD (Otolaryngology) Lloyd Huger, MD as Consulting Physician (Oncology) Noreene Filbert, MD as Referring Physician (Radiation Oncology)  I connected with William Johns on 01/25/19 at 10:00 AM EST by video enabled telemedicine visit and verified that I am speaking with the correct person using two identifiers.   I discussed the limitations, risks, security and privacy concerns of performing an evaluation and management service by telemedicine and the availability of in-person appointments. I also discussed with the patient that there may be a patient responsible charge related to this service. The patient expressed understanding and agreed to proceed.   Other persons participating in the visit and their role in the encounter: Patient, MD.  Patient's location: Home. Provider's location: Clinic.  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  INTERVAL HISTORY: Patient agreed to video enabled telemedicine visit for routine evaluation.  He currently feels well and is asymptomatic.  He denies any weakness or fatigue.  He no longer is complaining of dysphagia. He has no neurologic complaints.  He denies any recent fevers or illnesses. He has no chest pain, shortness of breath, cough, or hemoptysis.  He has no nausea, vomiting, constipation, or diarrhea.  He has no urinary complaints.  Patient offers no specific complaints today.  REVIEW OF SYSTEMS:   Review of Systems  Constitutional: Negative.  Negative for fever, malaise/fatigue and weight loss.  HENT: Negative for sore throat.   Respiratory: Negative.  Negative for cough and shortness of breath.   Cardiovascular: Negative.  Negative for chest pain and leg swelling.    Gastrointestinal: Negative.  Negative for abdominal pain.  Genitourinary: Negative.  Negative for dysuria.  Musculoskeletal: Negative.  Negative for back pain.  Skin: Negative.  Negative for rash.  Neurological: Negative.  Negative for speech change, weakness and headaches.  Psychiatric/Behavioral: Negative.  The patient is not nervous/anxious.     As per HPI. Otherwise, a complete review of systems is negative.  PAST MEDICAL HISTORY: Past Medical History:  Diagnosis Date  . ED (erectile dysfunction)   . GERD (gastroesophageal reflux disease)   . History of kidney stones 2013   s/p lithotripsy  . HLD (hyperlipidemia)   . HTN (hypertension)   . Obesity   . Squamous cell carcinoma of right tonsil ([place]) 05/2018   Chemo + rad tx's.   . T2DM (type 2 diabetes mellitus) ([place])    DSME at [facility] 01/2012  . Tonsillar mass 05/23/2018    PAST SURGICAL HISTORY: Past Surgical History:  Procedure Laterality Date  . CHOLECYSTECTOMY  2007  . COLONOSCOPY  11/2008   2 hyperplastic polyps [name])  . EXTRACORPOREAL SHOCK WAVE LITHOTRIPSY Left 04/28/2018   Procedure: EXTRACORPOREAL SHOCK WAVE LITHOTRIPSY (ESWL);  Surgeon: Billey Co, MD;  Location: [facility] ORS;  Service: Urology;  Laterality: Left;  . LITHOTRIPSY  2013   x 2    FAMILY HISTORY: Family History  Problem Relation Age of Onset  . Cancer Father 59       lung, smoker  . Aneurysm Mother 36       brain  . Coronary artery disease Neg Hx   . Stroke Neg Hx   . Diabetes Neg Hx     ADVANCED DIRECTIVES (Y/N):  N  HEALTH MAINTENANCE: Social History   Tobacco Use  .  Smoking status: Former Smoker    Quit date: 02/10/2007    Years since quitting: 11.9  . Smokeless tobacco: Never Used  Substance Use Topics  . Alcohol use: Yes    Alcohol/week: 0.0 standard drinks    Comment: 5 drinks/week   . Drug use: Yes    Comment: rare MJ     Colonoscopy:  PAP:  Bone density:  Lipid panel:  Allergies  Allergen Reactions  .  Oxycodone Nausea And Vomiting    Current Outpatient Medications  Medication Sig Dispense Refill  . aspirin 81 MG tablet Take 81 mg by mouth daily.    Marland Kitchen atorvastatin (LIPITOR) 40 MG tablet Take 1 tablet (40 mg total) by mouth daily. 90 tablet 3  . Blood Glucose Monitoring Suppl (BAYER CONTOUR MONITOR) w/Device KIT by Does not apply route. Use to check sugar daily and as needed Dx: E11.65    . glucose blood test strip 1 each by Other route as needed for other. Use as instructed to check sugar once daily and as needed. E11.65 **Contour**    . Lancets MISC by Does not apply route. Use as directed to check sugar once daily and as needed. Dx. E11.65 **Contour**    . lisinopril (ZESTRIL) 10 MG tablet TAKE 1 TABLET BY MOUTH EVERY DAY 90 tablet 3  . Multiple Vitamin (MULTIVITAMIN) tablet Take 1 tablet by mouth daily.    . OMEGA-3 FATTY ACIDS PO Take 1 capsule by mouth daily.    Marland Kitchen omeprazole (PRILOSEC OTC) 20 MG tablet Take 20 mg by mouth daily.    . sildenafil (VIAGRA) 100 MG tablet Take 100 mg by mouth daily as needed for erectile dysfunction.    . tamsulosin (FLOMAX) 0.4 MG CAPS capsule TAKE 1 CAPSULE (0.4 MG TOTAL) BY MOUTH DAILY AFTER SUPPER. 90 capsule 1   No current facility-administered medications for this visit.    OBJECTIVE: There were no vitals filed for this visit.   There is no height or weight on file to calculate BMI.    ECOG FS:0 - Asymptomatic  General: Well-developed, well-nourished, no acute distress. HEENT: Normocephalic. Neuro: Alert, answering all questions appropriately. Cranial nerves grossly intact. Psych: Normal affect.   LAB RESULTS:  Lab Results  Component Value Date   NA 140 01/24/2019   K 4.2 01/24/2019   CL 106 01/24/2019   CO2 25 01/24/2019   GLUCOSE 114 (H) 01/24/2019   BUN 21 01/24/2019   CREATININE 1.03 01/24/2019   CALCIUM 9.2 01/24/2019   PROT 7.2 10/26/2018   ALBUMIN 4.2 10/26/2018   AST 21 10/26/2018   ALT 11 10/26/2018   ALKPHOS 59  10/26/2018   BILITOT 1.0 10/26/2018   GFRNONAA >60 01/24/2019   GFRAA >60 01/24/2019    Lab Results  Component Value Date   WBC 5.3 01/24/2019   NEUTROABS 3.8 01/24/2019   HGB 13.3 01/24/2019   HCT 40.0 01/24/2019   MCV 92.4 01/24/2019   PLT 125 (L) 01/24/2019     STUDIES: No results found.  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  CT results from October 26, 2018 reviewed independently and reported as above with no obvious evidence of recurrent or progressive disease.  Patient was recently evaluated by both ENT and radiation oncology.  No further imaging is necessary unless there is concern for progression  of disease.  No intervention is needed at this time.  Return to clinic in 6 months with repeat laboratory work and video assisted telemedicine visit. 2.  Thrombocytopenia: Chronic and unchanged.  Monitor. 3.  Tinnitus: Chronic and unchanged.  Monitor.   Patient expressed understanding and was in agreement with this plan. He also understands that He can call clinic at any time with any questions, concerns, or complaints.   Cancer Staging Squamous cell carcinoma of right tonsil ([place]) Staging form: Pharynx - HPV-Mediated Oropharynx, AJCC 8th Edition - Clinical stage from 06/12/2018: Stage I (cT2, cN1, cM0, p16+) - Signed by Lloyd Huger, MD on 06/12/2018   Lloyd Huger, MD   01/25/2019 12:35 PM

## 2019-01-23 ENCOUNTER — Other Ambulatory Visit: Payer: Self-pay

## 2019-01-24 ENCOUNTER — Other Ambulatory Visit: Payer: Self-pay

## 2019-01-24 ENCOUNTER — Inpatient Hospital Stay: Payer: BC Managed Care – PPO | Attending: Oncology

## 2019-01-24 DIAGNOSIS — C099 Malignant neoplasm of tonsil, unspecified: Secondary | ICD-10-CM

## 2019-01-24 DIAGNOSIS — Z8589 Personal history of malignant neoplasm of other organs and systems: Secondary | ICD-10-CM | POA: Insufficient documentation

## 2019-01-24 LAB — CBC WITH DIFFERENTIAL/PLATELET
Abs Immature Granulocytes: 0.02 10*3/uL (ref 0.00–0.07)
Basophils Absolute: 0 10*3/uL (ref 0.0–0.1)
Basophils Relative: 1 %
Eosinophils Absolute: 0.2 10*3/uL (ref 0.0–0.5)
Eosinophils Relative: 4 %
HCT: 40 % (ref 39.0–52.0)
Hemoglobin: 13.3 g/dL (ref 13.0–17.0)
Immature Granulocytes: 0 %
Lymphocytes Relative: 14 %
Lymphs Abs: 0.8 10*3/uL (ref 0.7–4.0)
MCH: 30.7 pg (ref 26.0–34.0)
MCHC: 33.3 g/dL (ref 30.0–36.0)
MCV: 92.4 fL (ref 80.0–100.0)
Monocytes Absolute: 0.4 10*3/uL (ref 0.1–1.0)
Monocytes Relative: 8 %
Neutro Abs: 3.8 10*3/uL (ref 1.7–7.7)
Neutrophils Relative %: 73 %
Platelets: 125 10*3/uL — ABNORMAL LOW (ref 150–400)
RBC: 4.33 MIL/uL (ref 4.22–5.81)
RDW: 12.9 % (ref 11.5–15.5)
WBC: 5.3 10*3/uL (ref 4.0–10.5)
nRBC: 0 % (ref 0.0–0.2)

## 2019-01-24 LAB — BASIC METABOLIC PANEL
Anion gap: 9 (ref 5–15)
BUN: 21 mg/dL (ref 8–23)
CO2: 25 mmol/L (ref 22–32)
Calcium: 9.2 mg/dL (ref 8.9–10.3)
Chloride: 106 mmol/L (ref 98–111)
Creatinine, Ser: 1.03 mg/dL (ref 0.61–1.24)
GFR calc Af Amer: >60 mL/min (ref >60)
GFR calc non Af Amer: >60 mL/min (ref >60)
Glucose, Bld: 114 mg/dL — ABNORMAL HIGH (ref 70–99)
Potassium: 4.2 mmol/L (ref 3.5–5.1)
Sodium: 140 mmol/L (ref 135–145)

## 2019-01-25 ENCOUNTER — Inpatient Hospital Stay (HOSPITAL_BASED_OUTPATIENT_CLINIC_OR_DEPARTMENT_OTHER): Payer: BC Managed Care – PPO | Admitting: Oncology

## 2019-01-25 DIAGNOSIS — C099 Malignant neoplasm of tonsil, unspecified: Secondary | ICD-10-CM

## 2019-01-27 ENCOUNTER — Other Ambulatory Visit: Payer: BC Managed Care – PPO

## 2019-01-27 ENCOUNTER — Ambulatory Visit: Payer: BC Managed Care – PPO | Admitting: Oncology

## 2019-03-29 DIAGNOSIS — Z85818 Personal history of malignant neoplasm of other sites of lip, oral cavity, and pharynx: Secondary | ICD-10-CM | POA: Diagnosis not present

## 2019-03-29 DIAGNOSIS — J300 Vasomotor rhinitis: Secondary | ICD-10-CM | POA: Diagnosis not present

## 2019-04-03 ENCOUNTER — Encounter: Payer: Self-pay | Admitting: Family Medicine

## 2019-04-03 ENCOUNTER — Ambulatory Visit (INDEPENDENT_AMBULATORY_CARE_PROVIDER_SITE_OTHER): Payer: BC Managed Care – PPO | Admitting: Family Medicine

## 2019-04-03 ENCOUNTER — Other Ambulatory Visit: Payer: Self-pay

## 2019-04-03 VITALS — BP 130/82 | HR 69 | Temp 97.8°F | Ht 70.0 in | Wt 211.0 lb

## 2019-04-03 DIAGNOSIS — M65342 Trigger finger, left ring finger: Secondary | ICD-10-CM

## 2019-04-03 DIAGNOSIS — I10 Essential (primary) hypertension: Secondary | ICD-10-CM

## 2019-04-03 DIAGNOSIS — E669 Obesity, unspecified: Secondary | ICD-10-CM

## 2019-04-03 DIAGNOSIS — K219 Gastro-esophageal reflux disease without esophagitis: Secondary | ICD-10-CM

## 2019-04-03 DIAGNOSIS — Z8639 Personal history of other endocrine, nutritional and metabolic disease: Secondary | ICD-10-CM

## 2019-04-03 DIAGNOSIS — C099 Malignant neoplasm of tonsil, unspecified: Secondary | ICD-10-CM

## 2019-04-03 LAB — POCT GLYCOSYLATED HEMOGLOBIN (HGB A1C): Hemoglobin A1C: 5.3 % (ref 4.0–5.6)

## 2019-04-03 NOTE — Progress Notes (Signed)
This visit was conducted in person.  BP 130/82 (BP Location: Left Arm, Patient Position: Sitting, Cuff Size: Normal)   Pulse 69   Temp 97.8 F (36.6 C) (Temporal)   Ht 5' 10"  (1.778 m)   Wt 211 lb (95.7 kg)   SpO2 95%   BMI 30.28 kg/m    CC: DM 5mof/u visit Subjective:    Patient ID: William Johns male    DOB: 7February 10, 1957 64y.o.   MRN: [id number] HPI: William Herefordis a 64y.o. male presenting on 04/03/2019 for Follow-up (Here for 6 mo f/u.)   R tonsillar cancer (stage I squamous cell, p16+) found mid 2020 s/p chemoradiation therapy completed 08/2018. CT neck 10/2018 - no evidence of residual disease in neck. 35 lb weight loss through cancer treatment. Weight gain since then. Saw ENT ([name] last week - good report. No dysphagia, ST. Persistent dry mouth post XRT.   Carotid bruit - carotid [place] <<92%stenosis, LICA 433-00%stenosis, rpt 1 yr (10/2018).   Noticing triggering of L 4th finger - worse this winter. Can get painful.   H/o diabetes - largely resolved with weight loss due to above. Remains off metformin. Has not been checking sugars at home. Pneumovax 2014 Lab Results  Component Value Date   HGBA1C 5.3 04/03/2019    Has restarted flomax with benefit.  Takes prilosec OTC 261mdaily.   Alcohol - rarely      Relevant past medical, surgical, family and social history reviewed and updated as indicated. Interim medical history since our last visit reviewed. Allergies and medications reviewed and updated. Outpatient Medications Prior to Visit  Medication Sig Dispense Refill  . aspirin 81 MG tablet Take 81 mg by mouth daily.    . Marland Kitchentorvastatin (LIPITOR) 40 MG tablet Take 1 tablet (40 mg total) by mouth daily. 90 tablet 3  . Blood Glucose Monitoring Suppl (BAYER CONTOUR MONITOR) w/Device KIT by Does not apply route. Use to check sugar daily and as needed Dx: E11.65    . glucose blood test strip 1 each by Other route as needed for other. Use as instructed to check sugar  once daily and as needed. E11.65 **Contour**    . Lancets MISC by Does not apply route. Use as directed to check sugar once daily and as needed. Dx. E11.65 **Contour**    . lisinopril (ZESTRIL) 10 MG tablet TAKE 1 TABLET BY MOUTH EVERY DAY 90 tablet 3  . Multiple Vitamin (MULTIVITAMIN) tablet Take 1 tablet by mouth daily.    . OMEGA-3 FATTY ACIDS PO Take 1 capsule by mouth daily.    . Marland Kitchenmeprazole (PRILOSEC OTC) 20 MG tablet Take 20 mg by mouth daily.    . sildenafil (VIAGRA) 100 MG tablet Take 100 mg by mouth daily as needed for erectile dysfunction.    . tamsulosin (FLOMAX) 0.4 MG CAPS capsule TAKE 1 CAPSULE (0.4 MG TOTAL) BY MOUTH DAILY AFTER SUPPER. 90 capsule 1   No facility-administered medications prior to visit.     Per HPI unless specifically indicated in ROS section below Review of Systems Objective:    BP 130/82 (BP Location: Left Arm, Patient Position: Sitting, Cuff Size: Normal)   Pulse 69   Temp 97.8 F (36.6 C) (Temporal)   Ht 5' 10"  (1.778 m)   Wt 211 lb (95.7 kg)   SpO2 95%   BMI 30.28 kg/m   Wt Readings from Last 3 Encounters:  04/03/19 211 lb (95.7 kg)  12/26/18 199 lb  3.2 oz (90.4 kg)  10/27/18 202 lb (91.6 kg)    Physical Exam Vitals and nursing note reviewed.  Constitutional:      Appearance: Normal appearance. He is well-developed. He is not ill-appearing.  Eyes:     General: No scleral icterus.    Extraocular Movements: Extraocular movements intact.     Conjunctiva/sclera: Conjunctivae normal.     Pupils: Pupils are equal, round, and reactive to light.  Neck:     Thyroid: No thyromegaly or thyroid tenderness.  Cardiovascular:     Rate and Rhythm: Normal rate and regular rhythm.     Pulses: Normal pulses.     Heart sounds: Normal heart sounds. No murmur.  Pulmonary:     Effort: Pulmonary effort is normal. No respiratory distress.     Breath sounds: Normal breath sounds. No wheezing, rhonchi or rales.  Musculoskeletal:     Cervical back: Normal  range of motion and neck supple.     Right lower leg: No edema.     Left lower leg: No edema.     Comments: See HPI for foot exam if done  Lymphadenopathy:     Cervical: No cervical adenopathy.  Skin:    General: Skin is warm and dry.     Findings: No rash.  Neurological:     Mental Status: He is alert.  Psychiatric:        Mood and Affect: Mood normal.        Behavior: Behavior normal.       Results for orders placed or performed in visit on 04/03/19  POCT glycosylated hemoglobin (Hb A1C)  Result Value Ref Range   Hemoglobin A1C 5.3 4.0 - 5.6 %   HbA1c POC (<> result, manual entry)     HbA1c, POC (prediabetic range)     HbA1c, POC (controlled diabetic range)     Assessment & Plan:  This visit occurred during the SARS-CoV-2 public health emergency.  Safety protocols were in place, including screening questions prior to the visit, additional usage of staff PPE, and extensive cleaning of exam room while observing appropriate contact time as indicated for disinfecting solutions.   Problem List Items Addressed This Visit    Trigger finger, left ring finger    Anticipate L 4th digit triggering - discussed etiology and treatment options - suggested he schedule appt with Dr Lorelei Pont sports med.       Squamous cell carcinoma of right tonsil ([place])    Appreciate onc/rad onc/ENT care. Great reports on latest office visits.      Obesity, Class I, BMI 30-34.9    Reviewed weight gain noted - appetite has returned after tonsillar cancer treatment. Will need to watch this.       HTN (hypertension)    Chronic, stable on current regimen - continue.       History of diabetes mellitus, type II - Primary    Update A1c with noted weight gain. Previously on metformin - will remain off.       Relevant Orders   POCT glycosylated hemoglobin (Hb A1C) (Completed)   GERD (gastroesophageal reflux disease)    Continues OTC PPI daily.           No orders of the defined types were placed in  this encounter.  Orders Placed This Encounter  Procedures  . POCT glycosylated hemoglobin (Hb A1C)    Patient Instructions  You are doing well today! Continue current medicines.  Schedule appointment with Dr Lorelei Pont at your convenience  to check left trigger finger.  Return as needed or in 6 months for next physical.    Follow up plan: Return in about 6 months (around 10/01/2019) for annual exam, prior fasting for blood work.  Ria Bush, MD

## 2019-04-03 NOTE — Assessment & Plan Note (Signed)
Chronic, stable on current regimen - continue. 

## 2019-04-03 NOTE — Assessment & Plan Note (Signed)
Anticipate L 4th digit triggering - discussed etiology and treatment options - suggested he schedule appt with Dr Lorelei Pont sports med.

## 2019-04-03 NOTE — Assessment & Plan Note (Addendum)
Appreciate onc/rad onc/ENT care. Great reports on latest office visits.

## 2019-04-03 NOTE — Assessment & Plan Note (Signed)
Reviewed weight gain noted - appetite has returned after tonsillar cancer treatment. Will need to watch this.

## 2019-04-03 NOTE — Patient Instructions (Addendum)
You are doing well today! Continue current medicines.  Schedule appointment with Dr Lorelei Pont at your convenience to check left trigger finger.  Return as needed or in 6 months for next physical.

## 2019-04-03 NOTE — Assessment & Plan Note (Signed)
Continues OTC PPI daily.

## 2019-04-03 NOTE — Assessment & Plan Note (Addendum)
Update A1c with noted weight gain. Previously on metformin - will remain off.

## 2019-05-08 ENCOUNTER — Ambulatory Visit: Payer: BC Managed Care – PPO | Attending: Internal Medicine

## 2019-05-08 DIAGNOSIS — Z23 Encounter for immunization: Secondary | ICD-10-CM

## 2019-05-08 NOTE — Progress Notes (Signed)
   Covid-19 Vaccination Clinic  Name:  Estil Krizan    MRN: [id number] DOB: 03-13-1955  05/08/2019  Mr. Gennaro was observed post Covid-19 immunization for 15 minutes without incident. He was provided with Vaccine Information Sheet and instruction to access the V-Safe system.   Mr. Meaders was instructed to call 911 with any severe reactions post vaccine: Marland Kitchen Difficulty breathing  . Swelling of face and throat  . A fast heartbeat  . A bad rash all over body  . Dizziness and weakness   Immunizations Administered    Name Date Dose VIS Date Route   Pfizer COVID-19 Vaccine 05/08/2019  1:48 PM 0.3 mL 01/20/2019 Intramuscular   Manufacturer: [place]   Lot: [id number]   [place]: [id number]

## 2019-05-10 ENCOUNTER — Other Ambulatory Visit: Payer: Self-pay | Admitting: Family Medicine

## 2019-05-31 ENCOUNTER — Ambulatory Visit: Payer: BC Managed Care – PPO | Attending: Internal Medicine

## 2019-05-31 DIAGNOSIS — Z23 Encounter for immunization: Secondary | ICD-10-CM

## 2019-05-31 NOTE — Progress Notes (Signed)
   Covid-19 Vaccination Clinic  Name:  William Johns    MRN: [id number] DOB: [birth year]  05/31/2019  [name] was observed post Covid-19 immunization for 15 minutes without incident. He was provided with Vaccine Information Sheet and instruction to access the V-Safe system.   [name] was instructed to call 911 with any severe reactions post vaccine: Marland Kitchen Difficulty breathing  . Swelling of face and throat  . A fast heartbeat  . A bad rash all over body  . Dizziness and weakness   Immunizations Administered    Name Date Dose VIS Date Route   Pfizer COVID-19 Vaccine 05/31/2019  9:36 AM 0.3 mL 04/05/2018 Intramuscular   Manufacturer: [place]   Lot: [id number]   [place]: [id number]

## 2019-06-30 ENCOUNTER — Encounter: Payer: Self-pay | Admitting: Radiation Oncology

## 2019-06-30 ENCOUNTER — Other Ambulatory Visit: Payer: Self-pay

## 2019-07-03 ENCOUNTER — Encounter: Payer: Self-pay | Admitting: Radiation Oncology

## 2019-07-03 ENCOUNTER — Ambulatory Visit
Admission: RE | Admit: 2019-07-03 | Discharge: 2019-07-03 | Disposition: A | Discharge status: Home / Self Care | Payer: BC Managed Care – PPO | Source: Ambulatory Visit | Attending: Radiation Oncology | Admitting: Radiation Oncology

## 2019-07-03 ENCOUNTER — Other Ambulatory Visit: Payer: Self-pay

## 2019-07-03 ENCOUNTER — Other Ambulatory Visit: Payer: Self-pay | Admitting: Licensed Clinical Social Worker

## 2019-07-03 VITALS — BP 123/75 | HR 81 | Temp 96.8°F | Wt 220.0 lb

## 2019-07-03 DIAGNOSIS — C099 Malignant neoplasm of tonsil, unspecified: Secondary | ICD-10-CM

## 2019-07-03 DIAGNOSIS — J359 Chronic disease of tonsils and adenoids, unspecified: Secondary | ICD-10-CM

## 2019-07-03 DIAGNOSIS — Z85819 Personal history of malignant neoplasm of unspecified site of lip, oral cavity, and pharynx: Secondary | ICD-10-CM | POA: Diagnosis not present

## 2019-07-03 NOTE — Progress Notes (Signed)
Radiation Oncology Follow up Note  Name: William Johns   Date:   07/03/2019 MRN:  [id number] DOB: Aug 20, 1955    This 64 y.o. male presents to the clinic today for 39-month follow-up status post concurrent chemoradiation therapy for stage II (T2 N1 M0) squamous cell carcinoma the right tonsil p16 positive.  REFERRING PROVIDER: Ria Bush, MD  HPI: Patient is a 64 year old male now out 10 months having completed concurrent chemoradiation therapy for stage II squamous cell carcinoma the right tonsil seen today in routine follow-up he is doing well specifically denies head and neck pain dysphagia his taste is returning to normal he does have slight dryness of the mouth..  Patient has not had any follow-up imaging at this time.  COMPLICATIONS OF TREATMENT: none  FOLLOW UP COMPLIANCE: keeps appointments   PHYSICAL EXAM:  BP 123/75 (BP Location: Left Arm, Patient Position: Sitting, Cuff Size: Normal)   Pulse 81   Temp (!) 96.8 F (36 C) (Tympanic)   Wt 220 lb (99.8 kg)   BMI 31.57 kg/m  Oral cavity is clear no oral mucosal lesions are identified.  No evidence of cervical or supraclavicular adenopathy is identified.  Well-developed well-nourished patient in NAD. HEENT reveals PERLA, EOMI, discs not visualized.  Oral cavity is clear. No oral mucosal lesions are identified. Neck is clear without evidence of cervical or supraclavicular adenopathy. Lungs are clear to A&P. Cardiac examination is essentially unremarkable with regular rate and rhythm without murmur rub or thrill. Abdomen is benign with no organomegaly or masses noted. Motor sensory and DTR levels are equal and symmetric in the upper and lower extremities. Cranial nerves II through XII are grossly intact. Proprioception is intact. No peripheral adenopathy or edema is identified. No motor or sensory levels are noted. Crude visual fields are within normal range.  RADIOLOGY RESULTS: CT scan ordered in about 6 months  PLAN: Present time  patient continues to do well low side effect profile from concurrent chemoradiation therapy for head and neck cancer.  And pleased with his overall progress I have asked to see him back in 6 months with a CT scan of the soft tissue of the head and neck prior to that visit.  I will then start once year follow-up visits.  Patient knows to call with any concerns at any time.  I would like to take this opportunity to thank you for allowing me to participate in the care of your patient.Noreene Filbert, MD

## 2019-08-13 IMAGING — US ULTRASOUND OF THE LYMPH NODES
1 series · 13 of 15 positions shown · non-contrast
Comparison: none

INDICATION: Throat mass with right cervical adenopathy

[Series 1: ultrasound of the lymph nodes · 13 of 15 slices shown]
[im 1/15]
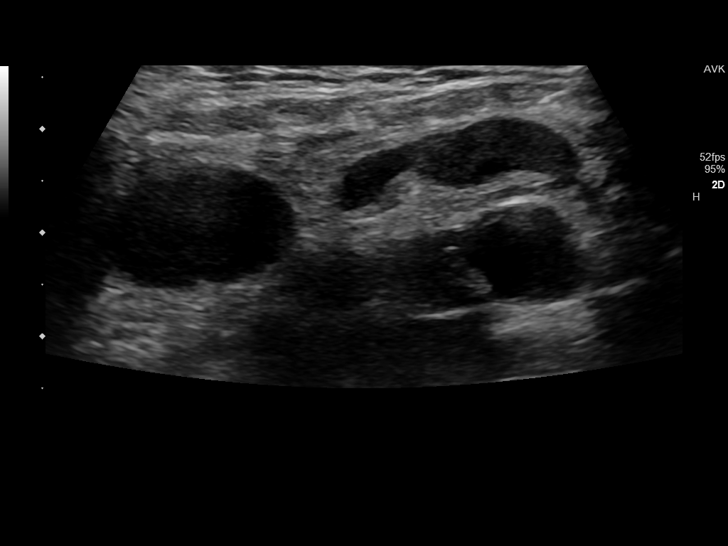
[im 2/15]
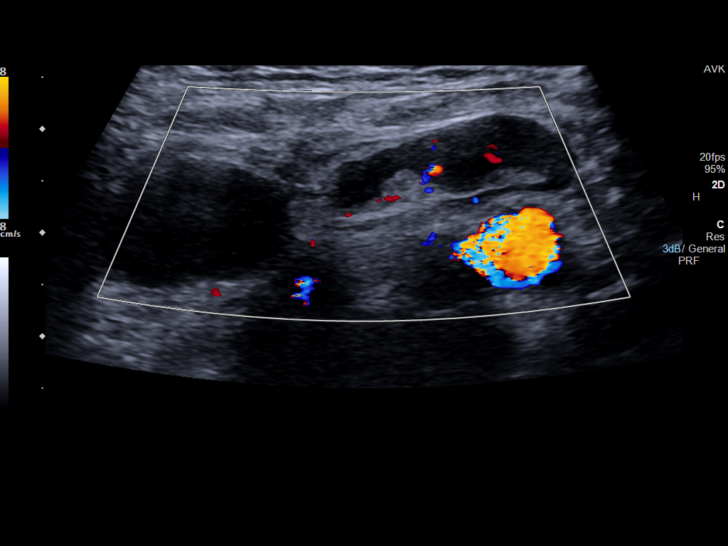
[im 3/15]
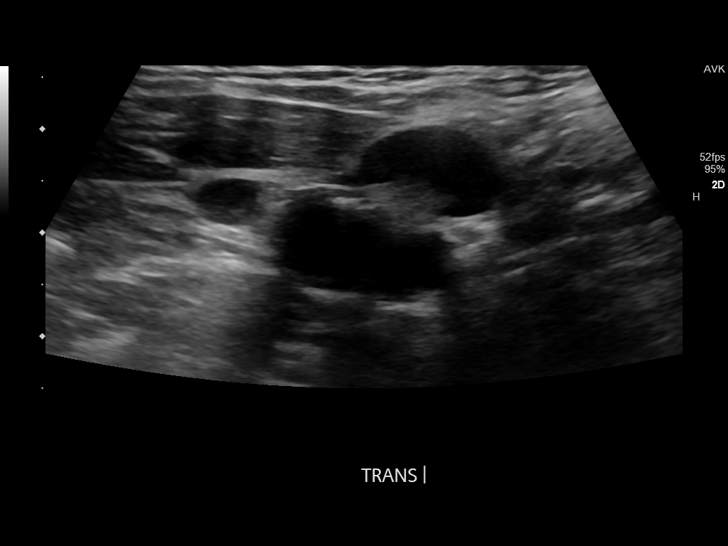
[im 5/15]
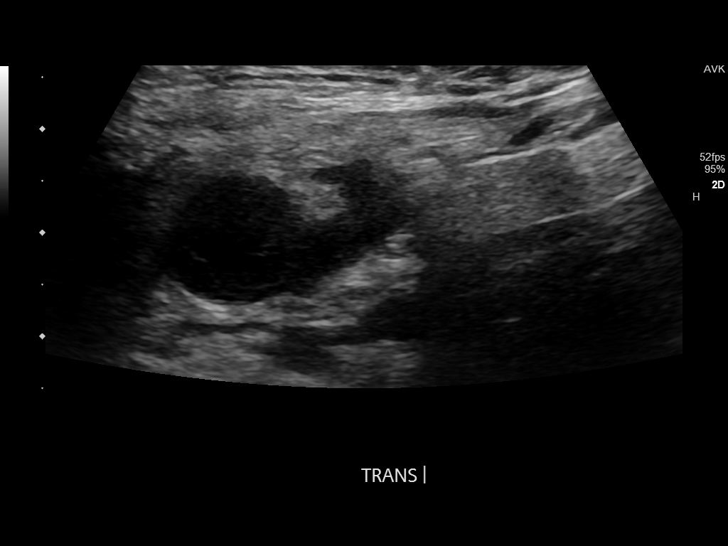
[im 6/15]
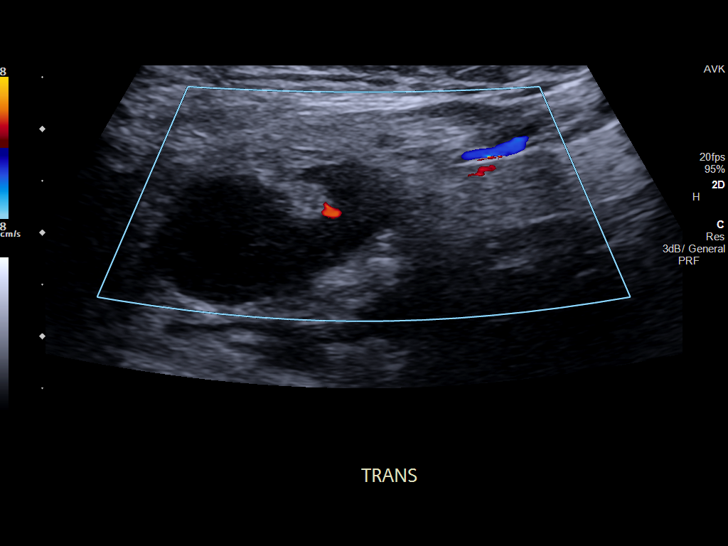
[im 7/15]
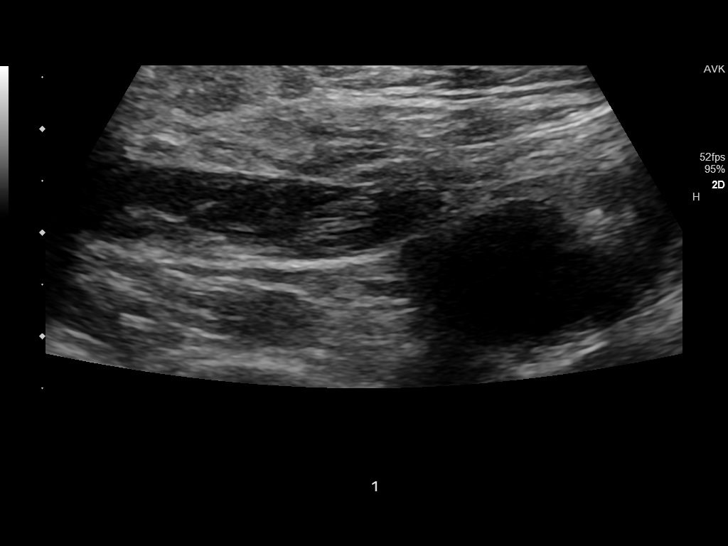
[im 8/15]
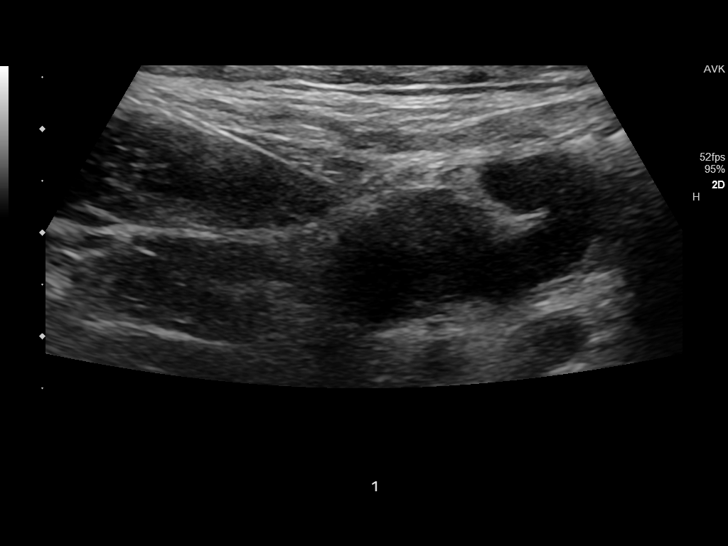
[im 9/15]
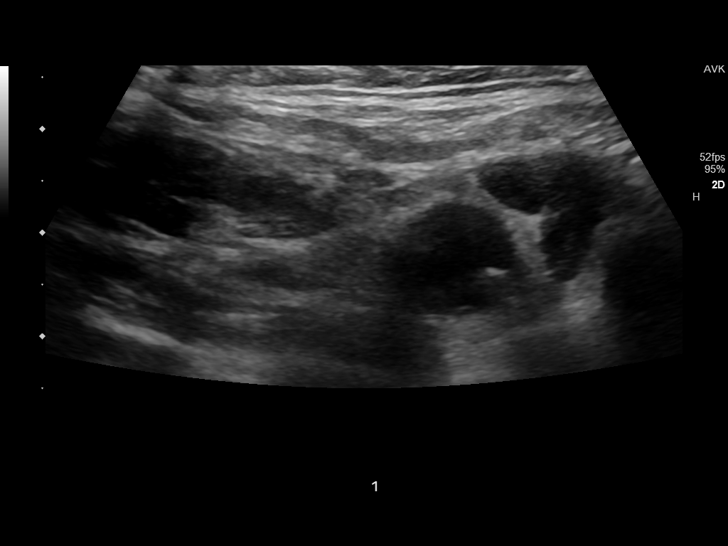
[im 10/15]
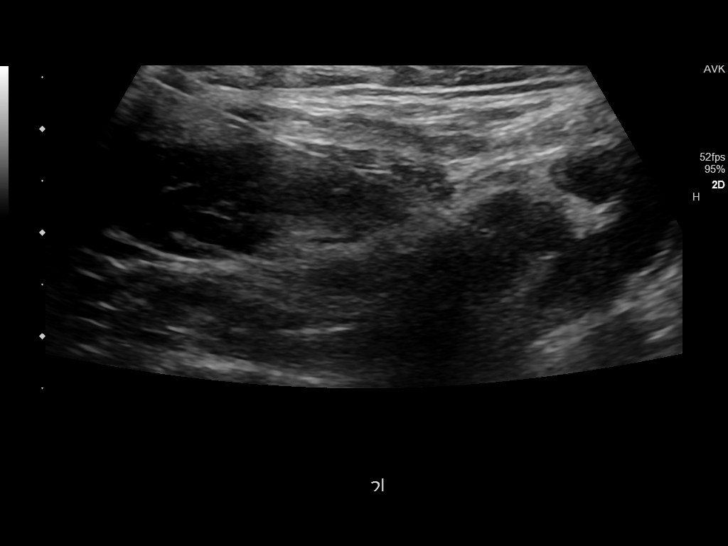
[im 11/15]
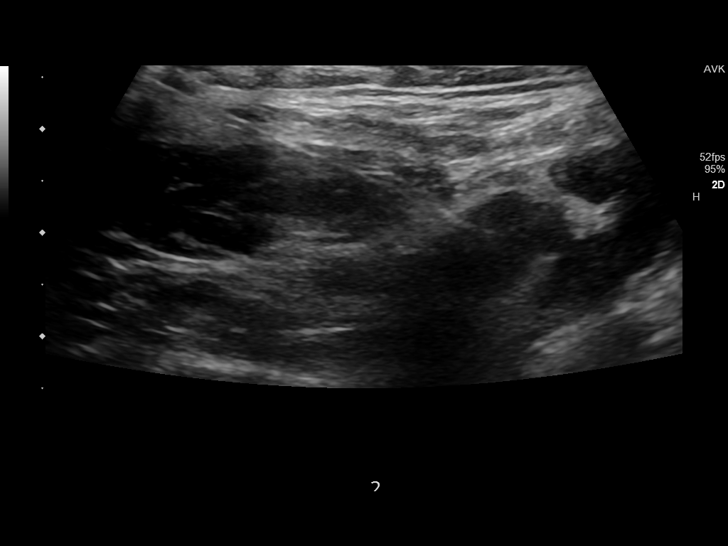
[im 13/15]
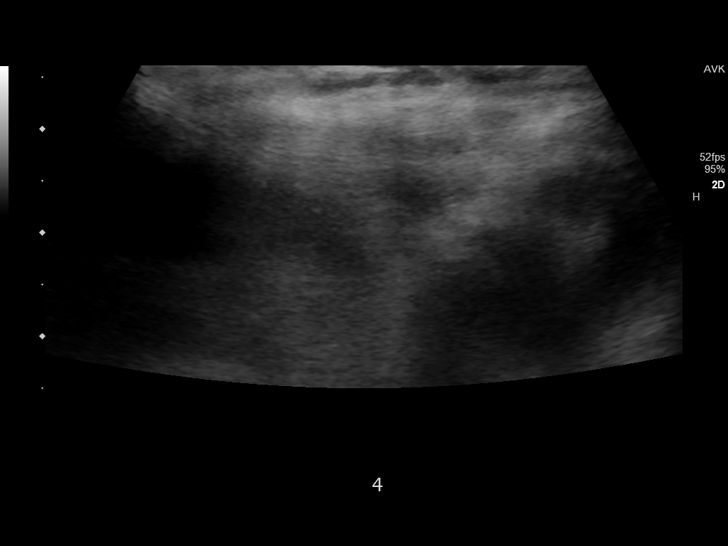
[im 14/15]
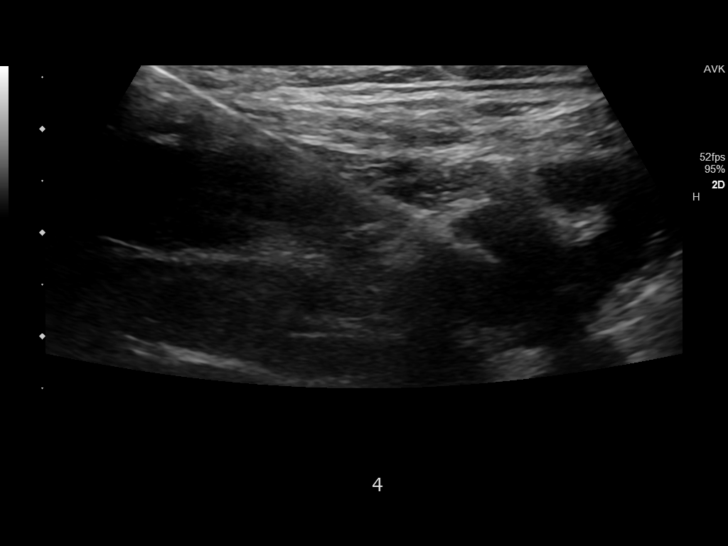
[im 15/15]
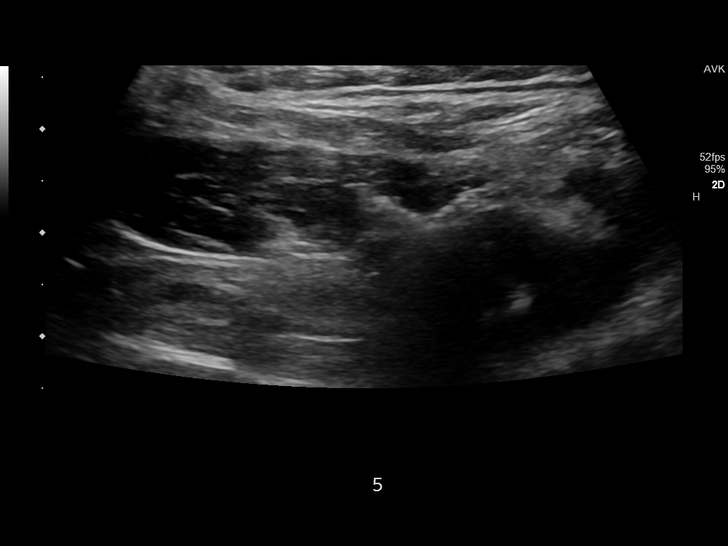

[13 of 15 positions shown; findings below may reference images not displayed]

EXAM:
ULTRASOUND GUIDED CORE BIOPSY OF RIGHT CERVICAL LYMPH NODE

MEDICATIONS:
None.

ANESTHESIA/SEDATION:
None

PROCEDURE:
The procedure, risks, benefits, and alternatives were explained to
the patient. Questions regarding the procedure were encouraged and
answered. The patient understands and consents to the procedure.

The operative field was prepped with chlorhexidine in a sterile
fashion, and a sterile drape was applied covering the operative
field. A sterile gown and sterile gloves were used for the
procedure. Local anesthesia was provided with 1% Lidocaine.

Patient was brought the ultrasound suite and prepped and draped the
usual sterile manner. Utilizing 0.25% Marcaine as local anesthetic
and real-time ultrasound guidance, a 17 gauge guiding needle was
placed adjacent to the cervical adenopathy. Through this guiding
needle, multiple 18 gauge biopsy cores were obtained. These were
submitted for pathologic evaluation on wet Telfa pad. Guiding needle
was then removed and hemostasis obtained at the puncture site.
Puncture site was prepped in the standard sterile manner. Patient
tolerated the procedure well and was returned his room in
satisfactory condition.

COMPLICATIONS:
None immediate.
FINDINGS: Cervical adenopathy is noted similar to that seen on prior CT
examination.
IMPRESSION: Successful right cervical lymph node biopsy under ultrasound
guidance.

## 2019-08-22 IMAGING — CT NUCLEAR MEDICINE PET IMAGE INITIAL (PI) SKULL BASE TO THIGH
1 of 10 series · 3 of 16 positions shown, 4 images · non-contrast
Comparison: Neck CT 05/23/2018.  Abdomen and pelvis CT 04/21/2018

CLINICAL DATA: Initial treatment strategy for metastatic squamous
cell carcinoma of the head and neck.

EXAM:
NUCLEAR MEDICINE PET SKULL BASE TO THIGH
TECHNIQUE: 13.4 mCi F-18 FDG was injected intravenously. Full-ring PET imaging
was performed from the skull base to thigh after the radiotracer. CT
data was obtained and used for attenuation correction and anatomic
localization.
Fasting blood glucose: 125 mg/dl

[Series 3: ct wb 5.0 b30f · axial · 0.98mm/px · z∈[-1162,-178]mm · 3 of 329 slices shown, 4 images]
[im 1/329  soft-tissue]
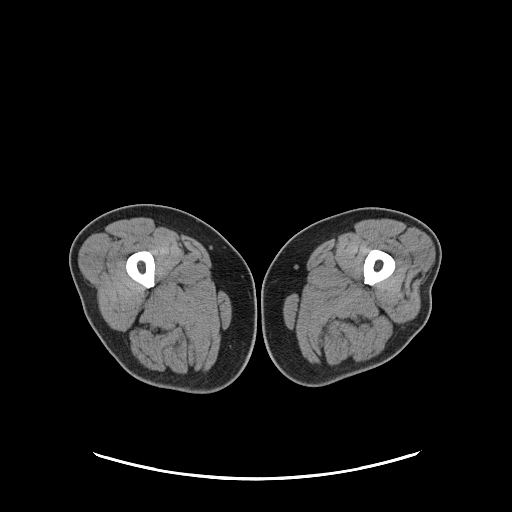
[im 1/329  bone]
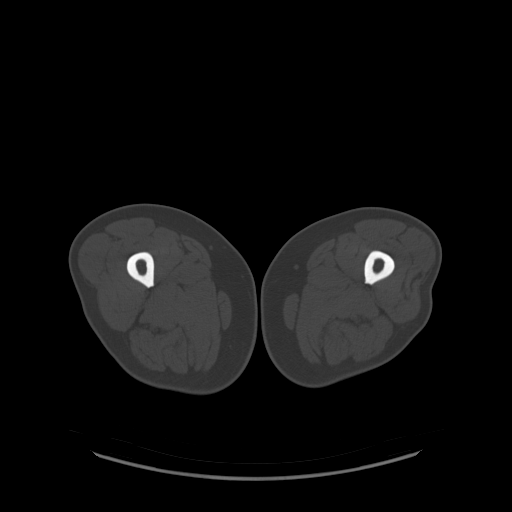
[im 165/329  soft-tissue]
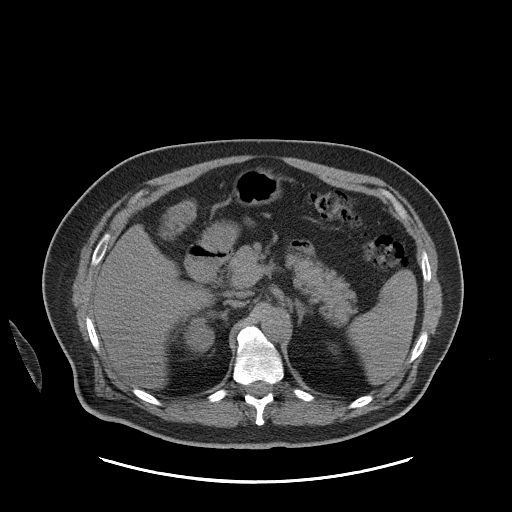
[im 329/329  soft-tissue]
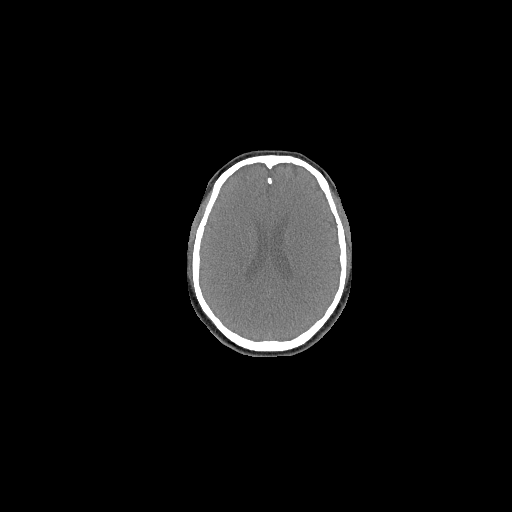

[3 of 16 positions shown; findings below may reference images not displayed]

FINDINGS: Mediastinal blood pool activity: SUV max

NECK: Asymmetric marked hypermetabolism identified in the right
tonsillar region with SUV max = 41.3.

Hypermetabolic right cervical level II lymph nodes demonstrate SUV
max = 7.5.

No evidence for hypermetabolic left cervical lymphadenopathy.

Incidental CT findings: none

CHEST: No hypermetabolic mediastinal or hilar nodes. No suspicious
pulmonary nodules on the CT scan.

Incidental CT findings: Coronary artery calcification is evident.
Atherosclerotic calcification is noted in the wall of the thoracic
aorta. Centrilobular emphsyema noted.

ABDOMEN/PELVIS: No abnormal hypermetabolic activity within the
liver, pancreas, adrenal glands, or spleen. No hypermetabolic lymph
nodes in the abdomen or pelvis.

Incidental CT findings: Diffuse low attenuation of the liver
parenchyma is compatible with fatty deposition. Gallbladder is
surgically absent. 7 mm nonobstructing stone noted upper pole right
kidney.

SKELETON: No focal hypermetabolic activity to suggest skeletal
metastasis.

Incidental CT findings: Focus of activity in the right antecubital
fossa is compatible with the injection site.
IMPRESSION: 1. Marked hypermetabolism in the right tonsillar region compatible
with patient's known malignancy. Hypermetabolic metastatic
lymphadenopathy noted right level II cervical lymph nodes. No
hypermetabolic metastases in the contralateral neck.
2. No unexpected or suspicious hypermetabolic disease in the chest,
abdomen, or pelvis.
3. Hepatic steatosis.
4.  Aortic Atherosclerois (PYZT9-170.0)

## 2019-09-27 DIAGNOSIS — J300 Vasomotor rhinitis: Secondary | ICD-10-CM | POA: Diagnosis not present

## 2019-09-27 DIAGNOSIS — Z85818 Personal history of malignant neoplasm of other sites of lip, oral cavity, and pharynx: Secondary | ICD-10-CM | POA: Diagnosis not present

## 2019-09-28 ENCOUNTER — Other Ambulatory Visit: Payer: Self-pay | Admitting: Family Medicine

## 2019-09-28 DIAGNOSIS — E785 Hyperlipidemia, unspecified: Secondary | ICD-10-CM

## 2019-09-28 DIAGNOSIS — C099 Malignant neoplasm of tonsil, unspecified: Secondary | ICD-10-CM

## 2019-09-28 DIAGNOSIS — Z125 Encounter for screening for malignant neoplasm of prostate: Secondary | ICD-10-CM

## 2019-09-29 ENCOUNTER — Other Ambulatory Visit: Payer: Self-pay

## 2019-09-29 ENCOUNTER — Other Ambulatory Visit (INDEPENDENT_AMBULATORY_CARE_PROVIDER_SITE_OTHER): Payer: BC Managed Care – PPO

## 2019-09-29 DIAGNOSIS — Z125 Encounter for screening for malignant neoplasm of prostate: Secondary | ICD-10-CM

## 2019-09-29 DIAGNOSIS — C099 Malignant neoplasm of tonsil, unspecified: Secondary | ICD-10-CM

## 2019-09-29 DIAGNOSIS — E785 Hyperlipidemia, unspecified: Secondary | ICD-10-CM | POA: Diagnosis not present

## 2019-09-29 LAB — CBC WITH DIFFERENTIAL/PLATELET
Basophils Absolute: 0.1 10*3/uL (ref 0.0–0.1)
Basophils Relative: 1 % (ref 0.0–3.0)
Eosinophils Absolute: 0.2 10*3/uL (ref 0.0–0.7)
Eosinophils Relative: 3.8 % (ref 0.0–5.0)
HCT: 42.6 % (ref 39.0–52.0)
Hemoglobin: 14.5 g/dL (ref 13.0–17.0)
Lymphocytes Relative: 15.3 % (ref 12.0–46.0)
Lymphs Abs: 0.8 10*3/uL (ref 0.7–4.0)
MCHC: 34.1 g/dL (ref 30.0–36.0)
MCV: 92.3 fl (ref 78.0–100.0)
Monocytes Absolute: 0.5 10*3/uL (ref 0.1–1.0)
Monocytes Relative: 8.9 % (ref 3.0–12.0)
Neutro Abs: 3.8 10*3/uL (ref 1.4–7.7)
Neutrophils Relative %: 71 % (ref 43.0–77.0)
Platelets: 135 10*3/uL — ABNORMAL LOW (ref 150.0–400.0)
RBC: 4.62 Mil/uL (ref 4.22–5.81)
RDW: 13.2 % (ref 11.5–15.5)
WBC: 5.4 10*3/uL (ref 4.0–10.5)

## 2019-09-29 LAB — COMPREHENSIVE METABOLIC PANEL
ALT: 18 U/L (ref 0–53)
AST: 16 U/L (ref 0–37)
Albumin: 4.1 g/dL (ref 3.5–5.2)
Alkaline Phosphatase: 79 U/L (ref 39–117)
BUN: 21 mg/dL (ref 6–23)
CO2: 28 mEq/L (ref 19–32)
Calcium: 9.4 mg/dL (ref 8.4–10.5)
Chloride: 106 mEq/L (ref 96–112)
Creatinine, Ser: 1.23 mg/dL (ref 0.40–1.50)
GFR: 59.23 mL/min — ABNORMAL LOW (ref >60.00)
Glucose, Bld: 131 mg/dL — ABNORMAL HIGH (ref 70–99)
Potassium: 4.2 mEq/L (ref 3.5–5.1)
Sodium: 141 mEq/L (ref 135–145)
Total Bilirubin: 0.8 mg/dL (ref 0.2–1.2)
Total Protein: 6.3 g/dL (ref 6.0–8.3)

## 2019-09-29 LAB — LIPID PANEL
Cholesterol: 132 mg/dL (ref 0–200)
HDL: 33.8 mg/dL — ABNORMAL LOW (ref >39.00)
LDL Cholesterol: 73 mg/dL (ref 0–99)
NonHDL: 98.47
Total CHOL/HDL Ratio: 4
Triglycerides: 126 mg/dL (ref 0.0–149.0)
VLDL: 25.2 mg/dL (ref 0.0–40.0)

## 2019-09-29 LAB — PSA: PSA: 1.53 ng/mL (ref 0.10–4.00)

## 2019-10-03 ENCOUNTER — Encounter: Payer: Self-pay | Admitting: Family Medicine

## 2019-10-03 ENCOUNTER — Other Ambulatory Visit: Payer: Self-pay

## 2019-10-03 ENCOUNTER — Ambulatory Visit (INDEPENDENT_AMBULATORY_CARE_PROVIDER_SITE_OTHER): Payer: BC Managed Care – PPO | Admitting: Family Medicine

## 2019-10-03 VITALS — BP 118/66 | HR 72 | Temp 98.2°F | Ht 70.0 in | Wt 229.1 lb

## 2019-10-03 DIAGNOSIS — Z0001 Encounter for general adult medical examination with abnormal findings: Secondary | ICD-10-CM | POA: Diagnosis not present

## 2019-10-03 DIAGNOSIS — Z1211 Encounter for screening for malignant neoplasm of colon: Secondary | ICD-10-CM | POA: Diagnosis not present

## 2019-10-03 DIAGNOSIS — Z23 Encounter for immunization: Secondary | ICD-10-CM | POA: Diagnosis not present

## 2019-10-03 DIAGNOSIS — S29012A Strain of muscle and tendon of back wall of thorax, initial encounter: Secondary | ICD-10-CM

## 2019-10-03 DIAGNOSIS — I10 Essential (primary) hypertension: Secondary | ICD-10-CM

## 2019-10-03 DIAGNOSIS — Z87891 Personal history of nicotine dependence: Secondary | ICD-10-CM | POA: Diagnosis not present

## 2019-10-03 DIAGNOSIS — E669 Obesity, unspecified: Secondary | ICD-10-CM

## 2019-10-03 DIAGNOSIS — Z8639 Personal history of other endocrine, nutritional and metabolic disease: Secondary | ICD-10-CM

## 2019-10-03 DIAGNOSIS — E785 Hyperlipidemia, unspecified: Secondary | ICD-10-CM

## 2019-10-03 DIAGNOSIS — E66811 Obesity, class 1: Secondary | ICD-10-CM

## 2019-10-03 DIAGNOSIS — K219 Gastro-esophageal reflux disease without esophagitis: Secondary | ICD-10-CM

## 2019-10-03 DIAGNOSIS — I77819 Aortic ectasia, unspecified site: Secondary | ICD-10-CM

## 2019-10-03 DIAGNOSIS — G8929 Other chronic pain: Secondary | ICD-10-CM

## 2019-10-03 DIAGNOSIS — I700 Atherosclerosis of aorta: Secondary | ICD-10-CM

## 2019-10-03 DIAGNOSIS — I6523 Occlusion and stenosis of bilateral carotid arteries: Secondary | ICD-10-CM

## 2019-10-03 LAB — POCT GLYCOSYLATED HEMOGLOBIN (HGB A1C): Hemoglobin A1C: 5.7 % — AB (ref 4.0–5.6)

## 2019-10-03 MED ORDER — LISINOPRIL 10 MG PO TABS
10.0000 mg | ORAL_TABLET | Freq: Every day | ORAL | 3 refills | Status: DC
Start: 2019-10-03 — End: 2020-09-04

## 2019-10-03 MED ORDER — ATORVASTATIN CALCIUM 40 MG PO TABS
40.0000 mg | ORAL_TABLET | Freq: Every day | ORAL | 3 refills | Status: DC
Start: 2019-10-03 — End: 2020-09-04

## 2019-10-03 MED ORDER — TRAMADOL HCL 50 MG PO TABS: 50.0000 mg | ORAL_TABLET | Freq: Three times a day (TID) | ORAL | 0 refills | Status: AC | PRN

## 2019-10-03 MED ORDER — METHOCARBAMOL 500 MG PO TABS: 500.0000 mg | ORAL_TABLET | Freq: Three times a day (TID) | ORAL | 1 refills | Status: DC | PRN

## 2019-10-03 NOTE — Progress Notes (Signed)
This visit was conducted in person.  BP 118/66 (BP Location: Left Arm, Patient Position: Sitting, Cuff Size: Large)   Pulse 72   Temp 98.2 F (36.8 C) (Temporal)   Ht 5' 10"  (1.778 m)   Wt 229 lb 2 oz (103.9 kg)   SpO2 95%   BMI 32.88 kg/m    CC: CPE Subjective:    Patient ID: William Johns, male    DOB: Feb 13, 1955, 64 y.o.   MRN: [id number]  HPI: William Johns is a 64 y.o. male presenting on 10/03/2019 for Annual Exam and Shoulder Pain (C/o pulled muscle in posterior right shoulder.  Happened 09/26/19 while lifting furniture. 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 Planning to return to work.   R shoulder injury - occurred 09/26/2019 while unloading truck of furniture. Started having R shoulder pain pain 3d later - points to R trap muscle. No radiation down arm. No numbness or weakness of arm. Hasn't tried anything other than icy hot for this.   GERD controlled with daily low dose PPI - breakthrough symptoms if QOD dosing.   Preventative: Colon cancer screening - 11/2008 2 hyperplastic polyps rec rpt 10 yrs ([name].  Prostate cancer screening - No prostate cancer hx. Nocturia x1-2. Lung cancer screening - ~30 PY hx. Quit 2009. fmhx lung cancer. Interested - will refer.  Flu yearly Pnemovax 2014 Tdap 2013 COVID vaccine - PWood3/2021, 05/2019 Shingrix -will start today  Seat belt use discussed.  Sunscreen use discussed. No changing moles on skin. 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 Interim medical history since our last visit reviewed. Allergies and medications reviewed and updated. Outpatient Medications Prior to Visit  Medication Sig Dispense Refill  . aspirin 81 MG tablet Take 81 mg by mouth daily.    . Blood Glucose Monitoring Suppl (BAYER CONTOUR MONITOR) w/Device KIT by Does not apply route. Use to check sugar daily and as needed Dx: E11.65    . glucose blood test strip 1 each by Other route as needed for other. Use as instructed to check sugar once daily and as needed. E11.65 **Contour**    . Lancets MISC by Does not apply route. Use as directed to check sugar once daily and as needed. Dx. E11.65 **Contour**    . Multiple Vitamin (MULTIVITAMIN) tablet Take 1 tablet by mouth daily.    . OMEGA-3 FATTY ACIDS PO Take 1 capsule by mouth daily.    .Marland Kitchenomeprazole (PRILOSEC OTC) 20 MG tablet Take 20 mg by mouth daily.    . sildenafil (VIAGRA) 100 MG tablet Take 100 mg by mouth daily as needed for erectile dysfunction.    .Marland Kitchenatorvastatin (LIPITOR) 40 MG tablet Take 1 tablet (40 mg total) by mouth daily. 90 tablet 3  . lisinopril (ZESTRIL) 10 MG tablet TAKE 1 TABLET BY MOUTH EVERY DAY 90 tablet 3  . tamsulosin (FLOMAX) 0.4 MG CAPS capsule TAKE 1 CAPSULE (0.4 MG TOTAL) BY MOUTH DAILY AFTER SUPPER.  90 capsule 1   No facility-administered medications prior to visit.     Per HPI unless specifically indicated in ROS section below Review of Systems  Constitutional: Negative for activity change, appetite change, chills, fatigue, fever and unexpected weight change.  HENT: Negative for hearing loss.   Eyes: Negative for visual disturbance.  Respiratory: Negative for cough, chest tightness, shortness of breath and wheezing.   Cardiovascular: Negative for chest pain, palpitations and leg swelling.  Gastrointestinal: Negative for abdominal distention, abdominal pain, blood in stool, constipation, diarrhea, nausea and vomiting.    Genitourinary: Negative for difficulty urinating and hematuria.  Musculoskeletal: Negative for arthralgias, myalgias and neck pain.  Skin: Negative for rash.  Neurological: Negative for dizziness, seizures, syncope and headaches.  Hematological: Negative for adenopathy. Does not bruise/bleed easily.  Psychiatric/Behavioral: Negative for dysphoric mood. The patient is not nervous/anxious.    Objective:  BP 118/66 (BP Location: Left Arm, Patient Position: Sitting, Cuff Size: Large)   Pulse 72   Temp 98.2 F (36.8 C) (Temporal)   Ht 5' 10"  (1.778 m)   Wt 229 lb 2 oz (103.9 kg)   SpO2 95%   BMI 32.88 kg/m   Wt Readings from Last 3 Encounters:  10/03/19 229 lb 2 oz (103.9 kg)  07/03/19 220 lb (99.8 kg)  04/03/19 211 lb (95.7 kg)      Physical Exam Vitals and nursing note reviewed.  Constitutional:      General: He is not in acute distress.    Appearance: He is well-developed.  HENT:     Head: Normocephalic and atraumatic.     Right Ear: Hearing, tympanic membrane, ear canal and external ear normal.     Left Ear: Hearing, tympanic membrane, ear canal and external ear normal.  Eyes:     General: No scleral icterus.    Extraocular Movements: Extraocular movements intact.     Conjunctiva/sclera: Conjunctivae normal.     Pupils: Pupils are equal, round, and reactive to light.  Neck:     Thyroid: No thyroid mass, thyromegaly or thyroid tenderness.     Vascular: No carotid bruit.  Cardiovascular:     Rate and Rhythm: Normal rate and regular rhythm.     Pulses: Normal pulses.          Radial pulses are 2+ on the right side and 2+ on the left side.     Heart sounds: Normal heart sounds. No murmur heard.   Pulmonary:     Effort: Pulmonary effort is normal. No respiratory distress.     Breath sounds: Normal breath sounds. No wheezing, rhonchi or rales.  Abdominal:     General: Abdomen is flat. Bowel sounds are normal. There is no distension.     Palpations: Abdomen is soft. There  is no mass.     Tenderness: There is no abdominal tenderness. There is no guarding or rebound.     Hernia: No hernia is present.  Musculoskeletal:        General: Normal range of motion.     Cervical back: Normal range of motion.     Right lower leg: No edema.     Left lower leg: No edema.     Comments: Tender to palpation along R posterior upper back at shoulder blade No pain to palpation midline cervical or thoracic spine, no pain at rhomboids or significant pain at shoujder bursa FROM at shoulders  LROM R lateral rotation at neck due to discomfort   Lymphadenopathy:  Cervical: No cervical adenopathy.  Skin:    General: Skin is warm and dry.     Findings: No rash.  Neurological:     General: No focal deficit present.     Mental Status: He is alert and oriented to person, place, and time.     Comments: CN grossly intact, station and gait intact  Psychiatric:        Mood and Affect: Mood normal.        Behavior: Behavior normal.        Thought Content: Thought content normal.        Judgment: Judgment normal.       Results for orders placed or performed in visit on 10/03/19  POCT glycosylated hemoglobin (Hb A1C)  Result Value Ref Range   Hemoglobin A1C 5.7 (A) 4.0 - 5.6 %   HbA1c POC (<> result, manual entry)     HbA1c, POC (prediabetic range)     HbA1c, POC (controlled diabetic range)     Assessment & Plan:  This visit occurred during the SARS-CoV-2 public health emergency.  Safety protocols were in place, including screening questions prior to the visit, additional usage of staff PPE, and extensive cleaning of exam room while observing appropriate contact time as indicated for disinfecting solutions.   Problem List Items Addressed This Visit    Obesity, Class I, BMI 30-34.9    Reviewed weight gain noted, encouraged healthy diet and lifestyle changes to affect sustainable weight loss. He is motivated to restart weight loss effort.       Muscle strain of right upper  back    Reassuring exam. Rx methocarbamol, gentle stretching, heating pad, and tramadol PRN.  Update if not improving with treatment.       HTN (hypertension)    Chronic, stable. Continue lisinopril.       Relevant Medications   atorvastatin (LIPITOR) 40 MG tablet   lisinopril (ZESTRIL) 10 MG tablet   History of diabetes mellitus, type II    Update A1c. Previously on metformin, had resolved with weight loss. Not interested in restarting at this time.      Relevant Orders   POCT glycosylated hemoglobin (Hb A1C) (Completed)   GERD (gastroesophageal reflux disease)    Continue daily low dose PPI - breakthrough symptoms if tries QOD dosing.       Encounter for general adult medical examination with abnormal findings - Primary    Preventative protocols reviewed and updated unless pt declined. Discussed healthy diet and lifestyle.       Encounter for chronic pain management    [place] CSRS reviewed. Tramadol Rx provided today PRN kidney stone pain, back pain.       Dyslipidemia    Chronic, continue atorvastatin with fish oil 1 tab daily.  The 10-year ASCVD risk score Mikey Bussing DC Brooke Bonito., et al., 2013) is: 20.4%   Values used to calculate the score:     Age: 61 years     Sex: Male     Is Non-Hispanic African American: No     Diabetic: Yes     Tobacco smoker: No     Systolic Blood Pressure: 756 mmHg     Is BP treated: Yes     HDL Cholesterol: 33.8 mg/dL     Total Cholesterol: 132 mg/dL       Relevant Medications   atorvastatin (LIPITOR) 40 MG tablet   Carotid stenosis, asymptomatic, bilateral    Update carotid US.       Relevant  Medications   atorvastatin (LIPITOR) 40 MG tablet   lisinopril (ZESTRIL) 10 MG tablet   Other Relevant Orders   VAS US CAROTID   Aortic dilatation (HCC)    Due for repeat - will order.       Relevant Medications   atorvastatin (LIPITOR) 40 MG tablet   lisinopril (ZESTRIL) 10 MG tablet   Other Relevant Orders   VAS [place] AAA DUPLEX   Abdominal  aortic atherosclerosis ([facility])    Continue aspirin, statin.       Relevant Medications   atorvastatin (LIPITOR) 40 MG tablet   lisinopril (ZESTRIL) 10 MG tablet    Other Visit Diagnoses    Special screening for malignant neoplasms, colon       Relevant Orders   Ambulatory referral to Gastroenterology   Ex-smoker       Relevant Orders   Ambulatory Referral for Lung Cancer Scre   Need for shingles vaccine       Relevant Orders   Varicella-zoster vaccine IM (Completed)       Meds ordered this encounter  Medications  . atorvastatin (LIPITOR) 40 MG tablet    Sig: Take 1 tablet (40 mg total) by mouth daily.    Dispense:  90 tablet    Refill:  3  . lisinopril (ZESTRIL) 10 MG tablet    Sig: Take 1 tablet (10 mg total) by mouth daily.    Dispense:  90 tablet    Refill:  3  . methocarbamol (ROBAXIN) 500 MG tablet    Sig: Take 1 tablet (500 mg total) by mouth 3 (three) times daily as needed for muscle spasms (sedation precautions).    Dispense:  40 tablet    Refill:  1  . traMADol (ULTRAM) 50 MG tablet    Sig: Take 1 tablet (50 mg total) by mouth every 8 (eight) hours as needed for up to 5 days for moderate pain.    Dispense:  15 tablet    Refill:  0   Orders Placed This Encounter  Procedures  . Varicella-zoster vaccine IM  . Ambulatory referral to Gastroenterology    Referral Priority:   Routine    Referral Type:   Consultation    Referral Reason:   Specialty Services Required    Number of Visits Requested:   1  . Ambulatory Referral for Lung Cancer Scre    Referral Priority:   Routine    Referral Type:   Consultation    Referral Reason:   Specialty Services Required    Number of Visits Requested:   1  . POCT glycosylated hemoglobin (Hb A1C)    Patient instructions: First shingrix vaccine. Return in 2-6 months for follow up visit.  A1c today.  We will refer you for colonoscopy.  We will refer you to lung cancer screening program.  For right shoulder - use robaxin  muscle relaxant 2-3 times daily as needed, use heating pad to the shoulder as well and gentle stretching of the shoulder.   Follow up plan: Return in about 1 year (around 10/02/2020) for annual exam, prior fasting for blood work.  Ria Bush, MD

## 2019-10-03 NOTE — Assessment & Plan Note (Signed)
Chronic, continue atorvastatin with fish oil 1 tab daily.  The 10-year ASCVD risk score Mikey Bussing DC Brooke Bonito., et al., 2013) is: 20.4%   Values used to calculate the score:     Age: 64 years     Sex: Male     Is Non-Hispanic African American: No     Diabetic: Yes     Tobacco smoker: No     Systolic Blood Pressure: 244 mmHg     Is BP treated: Yes     HDL Cholesterol: 33.8 mg/dL     Total Cholesterol: 132 mg/dL

## 2019-10-03 NOTE — Assessment & Plan Note (Signed)
Continue aspirin, statin.  

## 2019-10-03 NOTE — Patient Instructions (Addendum)
First shingrix vaccine. Return in 2-6 months for follow up visit.  A1c today.  We will refer you for colonoscopy.  We will refer you to lung cancer screening program.  For right shoulder - use robaxin muscle relaxant 2-3 times daily as needed, use heating pad to the shoulder as well and gentle stretching of the shoulder.   Health Maintenance, Male Adopting a healthy lifestyle and getting preventive care are important in promoting health and wellness. Ask your health care provider about:  The right schedule for you to have regular tests and exams.  Things you can do on your own to prevent diseases and keep yourself healthy. What should I know about diet, weight, and exercise? Eat a healthy diet   Eat a diet that includes plenty of vegetables, fruits, low-fat dairy products, and lean protein.  Do not eat a lot of foods that are high in solid fats, added sugars, or sodium. Maintain a healthy weight Body mass index (BMI) is a measurement that can be used to identify possible weight problems. It estimates body fat based on height and weight. Your health care provider can help determine your BMI and help you achieve or maintain a healthy weight. Get regular exercise Get regular exercise. This is one of the most important things you can do for your health. Most adults should:  Exercise for at least 150 minutes each week. The exercise should increase your heart rate and make you sweat (moderate-intensity exercise).  Do strengthening exercises at least twice a week. This is in addition to the moderate-intensity exercise.  Spend less time sitting. Even light physical activity can be beneficial. Watch cholesterol and blood lipids Have your blood tested for lipids and cholesterol at 64 years of age, then have this test every 5 years. You may need to have your cholesterol levels checked more often if:  Your lipid or cholesterol levels are high.  You are older than 64 years of age.  You are at  high risk for heart disease. What should I know about cancer screening? Many types of cancers can be detected early and may often be prevented. Depending on your health history and family history, you may need to have cancer screening at various ages. This may include screening for:  Colorectal cancer.  Prostate cancer.  Skin cancer.  Lung cancer. What should I know about heart disease, diabetes, and high blood pressure? Blood pressure and heart disease  High blood pressure causes heart disease and increases the risk of stroke. This is more likely to develop in people who have high blood pressure readings, are of African descent, or are overweight.  Talk with your health care provider about your target blood pressure readings.  Have your blood pressure checked: ? Every 3-5 years if you are 84-42 years of age. ? Every year if you are 59 years old or older.  If you are between the ages of 3 and 38 and are a current or former smoker, ask your health care provider if you should have a one-time screening for abdominal aortic aneurysm (AAA). Diabetes Have regular diabetes screenings. This checks your fasting blood sugar level. Have the screening done:  Once every three years after age 67 if you are at a normal weight and have a low risk for diabetes.  More often and at a younger age if you are overweight or have a high risk for diabetes. What should I know about preventing infection? Hepatitis B If you have a higher risk for  hepatitis B, you should be screened for this virus. Talk with your health care provider to find out if you are at risk for hepatitis B infection. Hepatitis C Blood testing is recommended for:  Everyone born from [birth year] through 1965.  Anyone with known risk factors for hepatitis C. Sexually transmitted infections (STIs)  You should be screened each year for STIs, including gonorrhea and chlamydia, if: ? You are sexually active and are younger than 64 years of  age. ? You are older than 64 years of age and your health care provider tells you that you are at risk for this type of infection. ? Your sexual activity has changed since you were last screened, and you are at increased risk for chlamydia or gonorrhea. Ask your health care provider if you are at risk.  Ask your health care provider about whether you are at high risk for HIV. Your health care provider may recommend a prescription medicine to help prevent HIV infection. If you choose to take medicine to prevent HIV, you should first get tested for HIV. You should then be tested every 3 months for as long as you are taking the medicine. Follow these instructions at home: Lifestyle  Do not use any products that contain nicotine or tobacco, such as cigarettes, e-cigarettes, and chewing tobacco. If you need help quitting, ask your health care provider.  Do not use street drugs.  Do not share needles.  Ask your health care provider for help if you need support or information about quitting drugs. Alcohol use  Do not drink alcohol if your health care provider tells you not to drink.  If you drink alcohol: ? Limit how much you have to 0-2 drinks a day. ? Be aware of how much alcohol is in your drink. In the [place] one drink equals one 12 oz bottle of beer (355 mL), one 5 oz glass of wine (148 mL), or one 1 oz glass of hard liquor (44 mL). General instructions  Schedule regular health, dental, and eye exams.  Stay current with your vaccines.  Tell your health care provider if: ? You often feel depressed. ? You have ever been abused or do not feel safe at home. Summary  Adopting a healthy lifestyle and getting preventive care are important in promoting health and wellness.  Follow your health care provider's instructions about healthy diet, exercising, and getting tested or screened for diseases.  Follow your health care provider's instructions on monitoring your cholesterol and blood  pressure. This information is not intended to replace advice given to you by your health care provider. Make sure you discuss any questions you have with your health care provider. Document Revised: 01/19/2018 Document Reviewed: 01/19/2018 Elsevier Patient Education  2020 [employer].

## 2019-10-03 NOTE — Assessment & Plan Note (Signed)
Update carotid US.  

## 2019-10-03 NOTE — Assessment & Plan Note (Signed)
Preventative protocols reviewed and updated unless pt declined. Discussed healthy diet and lifestyle.  

## 2019-10-03 NOTE — Assessment & Plan Note (Signed)
[place] CSRS reviewed. Tramadol Rx provided today PRN kidney stone pain, back pain.

## 2019-10-03 NOTE — Assessment & Plan Note (Signed)
Due for repeat - will order.

## 2019-10-03 NOTE — Assessment & Plan Note (Signed)
Continue daily low dose PPI - breakthrough symptoms if tries QOD dosing.

## 2019-10-03 NOTE — Assessment & Plan Note (Signed)
Reviewed weight gain noted, encouraged healthy diet and lifestyle changes to affect sustainable weight loss. He is motivated to restart weight loss effort.

## 2019-10-03 NOTE — Assessment & Plan Note (Signed)
Chronic, stable. Continue lisinopril.  

## 2019-10-03 NOTE — Assessment & Plan Note (Addendum)
Update A1c. Previously on metformin, had resolved with weight loss. Not interested in restarting at this time.

## 2019-10-03 NOTE — Assessment & Plan Note (Signed)
Reassuring exam. Rx methocarbamol, gentle stretching, heating pad, and tramadol PRN.  Update if not improving with treatment.

## 2019-10-09 ENCOUNTER — Telehealth: Payer: Self-pay

## 2019-10-09 NOTE — Telephone Encounter (Signed)
Contacted patient today for lung CT screening program after receiving referral from Dr. Danise Mina.  Patient recalls conversation about program with physician.  I explained program to patient and he is agreeable to participate. He states that he will be starting a new job next week and will out of town until the first of the year.  He is agreeable for our clinic to call him in January to schedule scan.  Smoking history will be covered during that call.  Patient appreciate of the call.

## 2019-12-26 ENCOUNTER — Ambulatory Visit
Admission: RE | Admit: 2019-12-26 | Discharge: 2019-12-26 | Disposition: A | Discharge status: Home / Self Care | Payer: BC Managed Care – PPO | Source: Ambulatory Visit | Attending: Radiation Oncology | Admitting: Radiation Oncology

## 2019-12-26 ENCOUNTER — Other Ambulatory Visit: Payer: Self-pay

## 2019-12-26 DIAGNOSIS — I6522 Occlusion and stenosis of left carotid artery: Secondary | ICD-10-CM | POA: Diagnosis not present

## 2019-12-26 DIAGNOSIS — I672 Cerebral atherosclerosis: Secondary | ICD-10-CM | POA: Diagnosis not present

## 2019-12-26 DIAGNOSIS — J359 Chronic disease of tonsils and adenoids, unspecified: Secondary | ICD-10-CM | POA: Insufficient documentation

## 2019-12-26 DIAGNOSIS — J439 Emphysema, unspecified: Secondary | ICD-10-CM | POA: Diagnosis not present

## 2019-12-26 LAB — POCT I-STAT CREATININE: Creatinine, Ser: 1.3 mg/dL — ABNORMAL HIGH (ref 0.61–1.24)

## 2019-12-26 MED ORDER — IOHEXOL 300 MG/ML  SOLN
75.0000 mL | Freq: Once | INTRAMUSCULAR | Status: AC | PRN
  Administered 2019-12-26: 75 mL via INTRAVENOUS

## 2019-12-27 ENCOUNTER — Inpatient Hospital Stay
Admission: RE | Admit: 2019-12-27 | Discharge: 2019-12-27 | Disposition: A | Discharge status: Home / Self Care | Payer: BC Managed Care – PPO | Source: Ambulatory Visit | Attending: Radiation Oncology | Admitting: Radiation Oncology

## 2020-01-03 ENCOUNTER — Ambulatory Visit: Payer: BC Managed Care – PPO | Admitting: Radiation Oncology

## 2020-01-08 ENCOUNTER — Ambulatory Visit
Admission: RE | Admit: 2020-01-08 | Discharge: 2020-01-08 | Disposition: A | Discharge status: Home / Self Care | Payer: BC Managed Care – PPO | Source: Ambulatory Visit | Attending: Radiation Oncology | Admitting: Radiation Oncology

## 2020-01-08 ENCOUNTER — Telehealth (INDEPENDENT_AMBULATORY_CARE_PROVIDER_SITE_OTHER): Payer: Self-pay

## 2020-01-08 ENCOUNTER — Other Ambulatory Visit: Payer: Self-pay

## 2020-01-08 ENCOUNTER — Other Ambulatory Visit: Payer: Self-pay | Admitting: Licensed Clinical Social Worker

## 2020-01-08 ENCOUNTER — Encounter: Payer: Self-pay | Admitting: Radiation Oncology

## 2020-01-08 VITALS — BP 131/78 | HR 86 | Temp 98.6°F | Wt 235.0 lb

## 2020-01-08 DIAGNOSIS — Z9221 Personal history of antineoplastic chemotherapy: Secondary | ICD-10-CM | POA: Diagnosis not present

## 2020-01-08 DIAGNOSIS — C099 Malignant neoplasm of tonsil, unspecified: Secondary | ICD-10-CM

## 2020-01-08 DIAGNOSIS — Z923 Personal history of irradiation: Secondary | ICD-10-CM | POA: Insufficient documentation

## 2020-01-08 DIAGNOSIS — Z85819 Personal history of malignant neoplasm of unspecified site of lip, oral cavity, and pharynx: Secondary | ICD-10-CM | POA: Insufficient documentation

## 2020-01-08 DIAGNOSIS — I6529 Occlusion and stenosis of unspecified carotid artery: Secondary | ICD-10-CM | POA: Diagnosis not present

## 2020-01-08 NOTE — Telephone Encounter (Signed)
Spoke with the patient and he is scheduled with Dr. Lucky Cowboy for a port placement on 02/05/20 with a 6:45 am arrival time to the [facility]. Covid testing on 02/01/20 between 8-1 pm at the [facility]. Pre-procedure instructions were discussed and will be mailed. The patient was offered 01/11/20, he declined stating it has to be after 01/29/20 and on a Monday as well.

## 2020-01-08 NOTE — Progress Notes (Signed)
Radiation Oncology Follow up Note  Name: William Johns   Date:   01/08/2020 MRN:  [id number] DOB: [birth year]    This 64 y.o. 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  REFERRING PROVIDER: Ria Bush, MD  HPI: Patient is a 64 year old male now out 16 months having completed concurrent chemoradiation therapy for stage II squamous cell carcinoma the right tonsil.  Seen today in routine follow-up he is doing well specifically denies head and neck pain or dysphagia.Marland Kitchen  He had a recent CT scan of the head and neck which I have reviewed shows no evidence of disease.  There is atherosclerotic plaque within the proximal left internal carotid artery which has been recommended for further work-up or imaging.  He is currently undergoing ENT exams by Dr. [name] showing no evidence of disease.  COMPLICATIONS OF TREATMENT: none  FOLLOW UP COMPLIANCE: keeps appointments   PHYSICAL EXAM:  BP 131/78 (BP Location: Left Arm, Patient Position: Sitting, Cuff Size: Large)   Pulse 86   Temp 98.6 F (37 C) (Tympanic)   Wt 235 lb (106.6 kg)   BMI 33.72 kg/m  Neck is clear without evidence of cervical or supraclavicular adenopathy.  Well-developed well-nourished patient in NAD. HEENT reveals PERLA, EOMI, discs not visualized.  Oral cavity is clear. No oral mucosal lesions are identified. Neck is clear without evidence of cervical or supraclavicular adenopathy. Lungs are clear to A&P. Cardiac examination is essentially unremarkable with regular rate and rhythm without murmur rub or thrill. Abdomen is benign with no organomegaly or masses noted. Motor sensory and DTR levels are equal and symmetric in the upper and lower extremities. Cranial nerves II through XII are grossly intact. Proprioception is intact. No peripheral adenopathy or edema is identified. No motor or sensory levels are  noted. Crude visual fields are within normal range.  RADIOLOGY RESULTS: CT scan reviewed compatible with above-stated findings  PLAN: Present time patient is now 16 months out with no evidence of disease.  And pleased with his overall progress.  I am making referral to vascular surgery Dr. Lazaro Arms for evaluation of the atherosclerotic plaque in his carotid.  He continues close follow-up care with Dr. [name] I have asked to see him back in 1 year for follow-up.  Patient knows to call with any concerns.  I would like to take this opportunity to thank you for allowing me to participate in the care of your patient.Noreene Filbert, MD

## 2020-02-01 ENCOUNTER — Other Ambulatory Visit: Payer: BC Managed Care – PPO

## 2020-02-05 ENCOUNTER — Ambulatory Visit: Admit: 2020-02-05 | Payer: BC Managed Care – PPO | Admitting: Vascular Surgery

## 2020-02-05 ENCOUNTER — Other Ambulatory Visit (INDEPENDENT_AMBULATORY_CARE_PROVIDER_SITE_OTHER): Payer: Self-pay | Admitting: Nurse Practitioner

## 2020-02-05 SURGERY — PORTA CATH INSERTION
Anesthesia: Moderate Sedation

## 2020-02-12 ENCOUNTER — Other Ambulatory Visit (INDEPENDENT_AMBULATORY_CARE_PROVIDER_SITE_OTHER): Payer: Self-pay | Admitting: Vascular Surgery

## 2020-02-12 ENCOUNTER — Ambulatory Visit (INDEPENDENT_AMBULATORY_CARE_PROVIDER_SITE_OTHER): Payer: BC Managed Care – PPO | Admitting: Vascular Surgery

## 2020-02-12 ENCOUNTER — Encounter (INDEPENDENT_AMBULATORY_CARE_PROVIDER_SITE_OTHER): Payer: Self-pay | Admitting: Vascular Surgery

## 2020-02-12 ENCOUNTER — Ambulatory Visit (INDEPENDENT_AMBULATORY_CARE_PROVIDER_SITE_OTHER): Payer: BC Managed Care – PPO

## 2020-02-12 ENCOUNTER — Other Ambulatory Visit: Payer: Self-pay

## 2020-02-12 VITALS — BP 137/86 | HR 59 | Ht 71.0 in | Wt 234.0 lb

## 2020-02-12 DIAGNOSIS — I6522 Occlusion and stenosis of left carotid artery: Secondary | ICD-10-CM

## 2020-02-12 DIAGNOSIS — E785 Hyperlipidemia, unspecified: Secondary | ICD-10-CM | POA: Diagnosis not present

## 2020-02-12 DIAGNOSIS — I10 Essential (primary) hypertension: Secondary | ICD-10-CM | POA: Diagnosis not present

## 2020-02-12 DIAGNOSIS — I700 Atherosclerosis of aorta: Secondary | ICD-10-CM | POA: Diagnosis not present

## 2020-02-12 DIAGNOSIS — I6523 Occlusion and stenosis of bilateral carotid arteries: Secondary | ICD-10-CM

## 2020-02-12 NOTE — Progress Notes (Signed)
MRN : [id number]  William Johns is a 65 y.o. (May 04, 1955) male who presents with chief complaint of  Chief Complaint  Patient presents with  . New Patient (Initial Visit)  . Carotid    U/S   .  History of Present Illness:  The patient is seen for evaluation of carotid stenosis. The carotid stenosis was identified after CT scan of the neck for follow up of tonsillar cancer.  The patient denies amaurosis fugax. There is no recent history of TIA symptoms or focal motor deficits. There is no prior documented CVA.  There is no history of migraine headaches. There is no history of seizures.  The patient is taking enteric-coated aspirin 81 mg daily.  The patient has a history of coronary artery disease, no recent episodes of angina or shortness of breath. The patient denies PAD or claudication symptoms. There is a history of hyperlipidemia which is being treated with a statin.   CT scan is reviewed by me and shows mild calcific disease left ICA > right.  Duplex ultrasound of the carotid arteries done today shows <40% bilaterally.  Current Meds  Medication Sig  . aspirin 81 MG tablet Take 81 mg by mouth daily.  Marland Kitchen atorvastatin (LIPITOR) 40 MG tablet Take 1 tablet (40 mg total) by mouth daily.  Marland Kitchen lisinopril (ZESTRIL) 10 MG tablet Take 1 tablet (10 mg total) by mouth daily.  . Multiple Vitamin (MULTIVITAMIN) tablet Take 1 tablet by mouth daily.  . OMEGA-3 FATTY ACIDS PO Take 1 capsule by mouth daily.  Marland Kitchen omeprazole (PRILOSEC OTC) 20 MG tablet Take 20 mg by mouth daily.  . sildenafil (VIAGRA) 100 MG tablet Take 100 mg by mouth daily as needed for erectile dysfunction.    Past Medical History:  Diagnosis Date  . ED (erectile dysfunction)   . GERD (gastroesophageal reflux disease)   . History of kidney stones 2013   s/p lithotripsy  . HLD (hyperlipidemia)   . HTN (hypertension)   . Obesity   . 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   . T2DM (type  2 diabetes mellitus) (HCC)    DSME at [facility] 01/2012  . Tonsillar mass 05/23/2018    Past Surgical History:  Procedure Laterality Date  . CHOLECYSTECTOMY  2007  . COLONOSCOPY  11/2008   2 hyperplastic polyps Christella Hartigan)  . EXTRACORPOREAL SHOCK WAVE LITHOTRIPSY Left 04/28/2018   Procedure: EXTRACORPOREAL SHOCK WAVE LITHOTRIPSY (ESWL);  Surgeon: Sondra Come, MD;  Location: [facility] ORS;  Service: Urology;  Laterality: Left;  . LITHOTRIPSY  2013   x 2    Social History Social History   Tobacco Use  . Smoking status: Former Smoker    Quit date: 02/10/2007    Years since quitting: 13.0  . Smokeless tobacco: Never Used  Substance Use Topics  . Alcohol use: Yes    Alcohol/week: 0.0 standard drinks    Comment: 5 drinks/week   . Drug use: Yes    Comment: rare MJ    Family History Family History  Problem Relation Age of Onset  . Cancer Father 35       lung, smoker  . Aneurysm Mother 44       brain  . Coronary artery disease Neg Hx   . Stroke Neg Hx   . Diabetes Neg Hx   No family history of bleeding/clotting disorders, porphyria or autoimmune disease   Allergies  Allergen Reactions  . Oxycodone Nausea And Vomiting  REVIEW OF SYSTEMS (Negative unless checked)  Constitutional: [] Weight loss  [] Fever  [] Chills Cardiac: [] Chest pain   [] Chest pressure   [] Palpitations   [] Shortness of breath when laying flat   [] Shortness of breath with exertion. Vascular:  [] Pain in legs with walking   [] Pain in legs at rest  [] History of DVT   [] Phlebitis   [] Swelling in legs   [] Varicose veins   [] Non-healing ulcers Pulmonary:   [] Uses home oxygen   [] Productive cough   [] Hemoptysis   [] Wheeze  [] COPD   [] Asthma Neurologic:  [] Dizziness   [] Seizures   [] History of stroke   [] History of TIA  [] Aphasia   [] Vissual changes   [] Weakness or numbness in arm   [] Weakness or numbness in leg Musculoskeletal:   [] Joint swelling   [x] Joint pain   [] Low back pain Hematologic:  [] Easy bruising  [] Easy  bleeding   [] Hypercoagulable state   [] Anemic Gastrointestinal:  [] Diarrhea   [] Vomiting  [x] Gastroesophageal reflux/heartburn   [] Difficulty swallowing. Genitourinary:  [] Chronic kidney disease   [] Difficult urination  [] Frequent urination   [] Blood in urine Skin:  [] Rashes   [] Ulcers  Psychological:  [] History of anxiety   []  History of major depression.  Physical Examination  Vitals:   02/12/20 0840  BP: 137/86  Pulse: (!) 59  Weight: 234 lb (106.1 kg)  Height: 5\' 11"  (1.803 m)   Body mass index is 32.64 kg/m. Gen: WD/WN, NAD Head: [place]/AT, No temporalis wasting.  Ear/Nose/Throat: Hearing grossly intact, nares w/o erythema or drainage, poor dentition Eyes: PER, EOMI, sclera nonicteric.  Neck: Supple, no masses.  No bruit or JVD.  Pulmonary:  Good air movement, clear to auscultation bilaterally, no use of accessory muscles.  Cardiac: RRR, normal S1, S2, no Murmurs. Vascular:  No carotid bruits Vessel Right Left  Radial Palpable Palpable  Carotid Palpable Palpable  Gastrointestinal: soft, non-distended. No guarding/no peritoneal signs.  Musculoskeletal: M/S 5/5 throughout.  No deformity or atrophy.  Neurologic: CN 2-12 intact. Pain and light touch intact in extremities.  Symmetrical.  Speech is fluent. Motor exam as listed above. Psychiatric: Judgment intact, Mood & affect appropriate for pt's clinical situation. Dermatologic: No rashes or ulcers noted.  No changes consistent with cellulitis.  CBC Lab Results  Component Value Date   WBC 5.4 09/29/2019   HGB 14.5 09/29/2019   HCT 42.6 09/29/2019   MCV 92.3 09/29/2019   PLT 135.0 (L) 09/29/2019    BMET    Component Value Date/Time   NA 141 09/29/2019 0839   NA 141 04/21/2018 1351   K 4.2 09/29/2019 0839   CL 106 09/29/2019 0839   CO2 28 09/29/2019 0839   GLUCOSE 131 (H) 09/29/2019 0839   BUN 21 09/29/2019 0839   BUN 23 04/21/2018 1351   CREATININE 1.30 (H) 12/26/2019 0808   CREATININE 1.17 11/11/2012 1539    CALCIUM 9.4 09/29/2019 0839   GFRNONAA >60 01/24/2019 0853   GFRAA >60 01/24/2019 0853   CrCl cannot be calculated (Patient's most recent lab result is older than the maximum 21 days allowed.).  COAG No results found for: INR, PROTIME  Radiology No results found.   Assessment/Plan 1. Carotid stenosis, asymptomatic, bilateral Recommend:  Given the patient's asymptomatic subcritical stenosis no further invasive testing or surgery at this time.  Duplex ultrasound shows <40% stenosis bilaterally.  Continue antiplatelet therapy as prescribed Continue management of CAD, HTN and Hyperlipidemia Healthy heart diet,  encouraged exercise at least 4 times per week Follow up in 6  months with duplex ultrasound and physical exam.   - VAS US CAROTID; Future  2. Primary hypertension Continue antihypertensive medications as already ordered, these medications have been reviewed and there are no changes at this time.   3. Abdominal aortic atherosclerosis ([place]) Recommend:  I do not find evidence of life style limiting vascular disease. The patient specifically denies life style limitation.  Previous noninvasive studies including ABI's of the legs do not identify critical vascular problems.  The patient should continue walking and begin a more formal exercise program. The patient should continue his antiplatelet therapy and aggressive treatment of the lipid abnormalities.   4. Dyslipidemia Continue statin as ordered and reviewed, no changes at this time     Hortencia Pilar, MD  02/12/2020 8:49 AM

## 2020-02-14 ENCOUNTER — Other Ambulatory Visit: Payer: BC Managed Care – PPO

## 2020-02-15 ENCOUNTER — Ambulatory Visit (INDEPENDENT_AMBULATORY_CARE_PROVIDER_SITE_OTHER): Payer: BC Managed Care – PPO

## 2020-02-15 DIAGNOSIS — Z23 Encounter for immunization: Secondary | ICD-10-CM

## 2020-02-15 NOTE — Progress Notes (Signed)
Per orders of Dr. Janyce Llanos signed by Vernona Rieger, NP in his absense , injection of Shingrix #2 given by Consuella Lose. Patient tolerated injection well.

## 2020-02-28 ENCOUNTER — Ambulatory Visit: Payer: BC Managed Care – PPO

## 2020-03-21 ENCOUNTER — Telehealth: Payer: Self-pay | Admitting: *Deleted

## 2020-03-21 DIAGNOSIS — Z122 Encounter for screening for malignant neoplasm of respiratory organs: Secondary | ICD-10-CM

## 2020-03-21 DIAGNOSIS — Z87891 Personal history of nicotine dependence: Secondary | ICD-10-CM

## 2020-03-21 NOTE — Telephone Encounter (Signed)
Received referral for initial lung cancer screening scan. Contacted patient and obtained smoking history,(former, quit 02/10/2007, 36 pack year) as well as answering questions related to screening process. Patient denies signs of lung cancer such as weight loss or hemoptysis. Patient denies comorbidity that would prevent curative treatment if lung cancer were found. Patient is scheduled for shared decision making visit and CT scan on 03/27/20 at 1045am.

## 2020-03-27 ENCOUNTER — Encounter: Payer: Self-pay | Admitting: Hospice and Palliative Medicine

## 2020-03-27 ENCOUNTER — Inpatient Hospital Stay: Payer: BC Managed Care – PPO | Attending: Oncology | Admitting: Hospice and Palliative Medicine

## 2020-03-27 ENCOUNTER — Other Ambulatory Visit: Payer: Self-pay

## 2020-03-27 ENCOUNTER — Ambulatory Visit
Admission: RE | Admit: 2020-03-27 | Discharge: 2020-03-27 | Disposition: A | Discharge status: Home / Self Care | Payer: BC Managed Care – PPO | Source: Ambulatory Visit | Attending: Oncology | Admitting: Oncology

## 2020-03-27 DIAGNOSIS — Z122 Encounter for screening for malignant neoplasm of respiratory organs: Secondary | ICD-10-CM | POA: Diagnosis not present

## 2020-03-27 DIAGNOSIS — H6123 Impacted cerumen, bilateral: Secondary | ICD-10-CM | POA: Diagnosis not present

## 2020-03-27 DIAGNOSIS — J300 Vasomotor rhinitis: Secondary | ICD-10-CM | POA: Diagnosis not present

## 2020-03-27 DIAGNOSIS — Z87891 Personal history of nicotine dependence: Secondary | ICD-10-CM | POA: Insufficient documentation

## 2020-03-27 DIAGNOSIS — Z85818 Personal history of malignant neoplasm of other sites of lip, oral cavity, and pharynx: Secondary | ICD-10-CM | POA: Diagnosis not present

## 2020-03-27 NOTE — Progress Notes (Signed)
Virtual Visit via Video Note  I connected with@ on 03/27/20 at@ by a video enabled telemedicine application and verified that I am speaking with the correct person using two identifiers.   I discussed the limitations of evaluation and management by telemedicine and the availability of in person appointments. The patient expressed understanding and agreed to proceed.  In accordance with CMS guidelines, patient has met eligibility criteria including age, absence of signs or symptoms of lung cancer.  Social History   Tobacco Use  . Smoking status: Former Smoker    Packs/day: 1.00    Years: 36.00    Pack years: 36.00    Types: Cigarettes    Quit date: 02/10/2007    Years since quitting: 13.1  . Smokeless tobacco: Never Used  Substance Use Topics  . Alcohol use: Yes    Alcohol/week: 0.0 standard drinks    Comment: 5 drinks/week   . Drug use: Yes    Comment: rare MJ      A shared decision-making session was conducted prior to the performance of CT scan. This includes one or more decision aids, includes benefits and harms of screening, follow-up diagnostic testing, over-diagnosis, false positive rate, and total radiation exposure.   Counseling on the importance of adherence to annual lung cancer LDCT screening, impact of co-morbidities, and ability or willingness to undergo diagnosis and treatment is imperative for compliance of the program.   Counseling on the importance of continued smoking cessation for former smokers; the importance of smoking cessation for current smokers, and information about tobacco cessation interventions have been given to patient including [place] and 1800 quit [place] programs.   Written order for lung cancer screening with LDCT has been given to the patient and any and all questions have been answered to the best of my abilities.    Yearly follow up will be coordinated by Burgess Estelle, Thoracic Navigator.  Time Total: 15 minutes  Visit consisted  of counseling and education dealing with complex health screening. Greater than 50%  of this time was spent counseling and coordinating care related to the above assessment and plan.  Signed by: Altha Harm, PhD, NP-C

## 2020-03-29 ENCOUNTER — Encounter: Payer: Self-pay | Admitting: *Deleted

## 2020-07-18 ENCOUNTER — Ambulatory Visit (INDEPENDENT_AMBULATORY_CARE_PROVIDER_SITE_OTHER): Payer: BC Managed Care – PPO | Admitting: Vascular Surgery

## 2020-07-18 ENCOUNTER — Encounter (INDEPENDENT_AMBULATORY_CARE_PROVIDER_SITE_OTHER): Payer: BC Managed Care – PPO

## 2020-09-04 ENCOUNTER — Other Ambulatory Visit: Payer: Self-pay | Admitting: Family Medicine

## 2020-09-20 ENCOUNTER — Ambulatory Visit (INDEPENDENT_AMBULATORY_CARE_PROVIDER_SITE_OTHER): Payer: Medicare Other

## 2020-09-20 ENCOUNTER — Other Ambulatory Visit: Payer: Self-pay

## 2020-09-20 DIAGNOSIS — I77819 Aortic ectasia, unspecified site: Secondary | ICD-10-CM

## 2020-09-22 ENCOUNTER — Other Ambulatory Visit: Payer: Self-pay | Admitting: Family Medicine

## 2020-09-22 DIAGNOSIS — D696 Thrombocytopenia, unspecified: Secondary | ICD-10-CM

## 2020-09-22 DIAGNOSIS — Z8639 Personal history of other endocrine, nutritional and metabolic disease: Secondary | ICD-10-CM

## 2020-09-22 DIAGNOSIS — E785 Hyperlipidemia, unspecified: Secondary | ICD-10-CM

## 2020-09-22 DIAGNOSIS — Z125 Encounter for screening for malignant neoplasm of prostate: Secondary | ICD-10-CM

## 2020-09-25 DIAGNOSIS — J300 Vasomotor rhinitis: Secondary | ICD-10-CM | POA: Diagnosis not present

## 2020-09-25 DIAGNOSIS — Z85818 Personal history of malignant neoplasm of other sites of lip, oral cavity, and pharynx: Secondary | ICD-10-CM | POA: Diagnosis not present

## 2020-09-27 ENCOUNTER — Other Ambulatory Visit: Payer: Self-pay

## 2020-09-27 ENCOUNTER — Other Ambulatory Visit (INDEPENDENT_AMBULATORY_CARE_PROVIDER_SITE_OTHER): Payer: Medicare Other

## 2020-09-27 DIAGNOSIS — D696 Thrombocytopenia, unspecified: Secondary | ICD-10-CM | POA: Diagnosis not present

## 2020-09-27 DIAGNOSIS — Z125 Encounter for screening for malignant neoplasm of prostate: Secondary | ICD-10-CM

## 2020-09-27 DIAGNOSIS — Z8639 Personal history of other endocrine, nutritional and metabolic disease: Secondary | ICD-10-CM

## 2020-09-27 DIAGNOSIS — E785 Hyperlipidemia, unspecified: Secondary | ICD-10-CM | POA: Diagnosis not present

## 2020-09-27 LAB — LDL CHOLESTEROL, DIRECT: Direct LDL: 79 mg/dL

## 2020-09-27 LAB — COMPREHENSIVE METABOLIC PANEL
ALT: 18 U/L (ref 0–53)
AST: 17 U/L (ref 0–37)
Albumin: 4 g/dL (ref 3.5–5.2)
Alkaline Phosphatase: 78 U/L (ref 39–117)
BUN: 17 mg/dL (ref 6–23)
CO2: 27 mEq/L (ref 19–32)
Calcium: 9.2 mg/dL (ref 8.4–10.5)
Chloride: 105 mEq/L (ref 96–112)
Creatinine, Ser: 1.37 mg/dL (ref 0.40–1.50)
GFR: 54.3 mL/min — ABNORMAL LOW (ref >60.00)
Glucose, Bld: 118 mg/dL — ABNORMAL HIGH (ref 70–99)
Potassium: 4.1 mEq/L (ref 3.5–5.1)
Sodium: 140 mEq/L (ref 135–145)
Total Bilirubin: 0.6 mg/dL (ref 0.2–1.2)
Total Protein: 6.3 g/dL (ref 6.0–8.3)

## 2020-09-27 LAB — CBC WITH DIFFERENTIAL/PLATELET
Basophils Absolute: 0 10*3/uL (ref 0.0–0.1)
Basophils Relative: 0.8 % (ref 0.0–3.0)
Eosinophils Absolute: 0.2 10*3/uL (ref 0.0–0.7)
Eosinophils Relative: 3.2 % (ref 0.0–5.0)
HCT: 43.2 % (ref 39.0–52.0)
Hemoglobin: 14.4 g/dL (ref 13.0–17.0)
Lymphocytes Relative: 16.2 % (ref 12.0–46.0)
Lymphs Abs: 0.9 10*3/uL (ref 0.7–4.0)
MCHC: 33.4 g/dL (ref 30.0–36.0)
MCV: 91.3 fl (ref 78.0–100.0)
Monocytes Absolute: 0.4 10*3/uL (ref 0.1–1.0)
Monocytes Relative: 6.6 % (ref 3.0–12.0)
Neutro Abs: 3.9 10*3/uL (ref 1.4–7.7)
Neutrophils Relative %: 73.2 % (ref 43.0–77.0)
Platelets: 148 10*3/uL — ABNORMAL LOW (ref 150.0–400.0)
RBC: 4.73 Mil/uL (ref 4.22–5.81)
RDW: 13.7 % (ref 11.5–15.5)
WBC: 5.4 10*3/uL (ref 4.0–10.5)

## 2020-09-27 LAB — LIPID PANEL
Cholesterol: 130 mg/dL (ref 0–200)
HDL: 28.7 mg/dL — ABNORMAL LOW (ref >39.00)
NonHDL: 101.11
Total CHOL/HDL Ratio: 5
Triglycerides: 201 mg/dL — ABNORMAL HIGH (ref 0.0–149.0)
VLDL: 40.2 mg/dL — ABNORMAL HIGH (ref 0.0–40.0)

## 2020-09-27 LAB — HEMOGLOBIN A1C: Hgb A1c MFr Bld: 6.4 % (ref 4.6–6.5)

## 2020-09-27 LAB — PSA: PSA: 2.18 ng/mL (ref 0.10–4.00)

## 2020-10-04 ENCOUNTER — Other Ambulatory Visit: Payer: Self-pay

## 2020-10-04 ENCOUNTER — Ambulatory Visit (INDEPENDENT_AMBULATORY_CARE_PROVIDER_SITE_OTHER): Payer: Medicare Other | Admitting: Family Medicine

## 2020-10-04 ENCOUNTER — Encounter: Payer: Self-pay | Admitting: Family Medicine

## 2020-10-04 VITALS — BP 138/84 | HR 69 | Temp 97.7°F | Ht 71.0 in | Wt 224.0 lb

## 2020-10-04 DIAGNOSIS — Z0000 Encounter for general adult medical examination without abnormal findings: Secondary | ICD-10-CM

## 2020-10-04 DIAGNOSIS — Z7189 Other specified counseling: Secondary | ICD-10-CM

## 2020-10-04 DIAGNOSIS — E669 Obesity, unspecified: Secondary | ICD-10-CM

## 2020-10-04 DIAGNOSIS — K219 Gastro-esophageal reflux disease without esophagitis: Secondary | ICD-10-CM

## 2020-10-04 DIAGNOSIS — I6523 Occlusion and stenosis of bilateral carotid arteries: Secondary | ICD-10-CM

## 2020-10-04 DIAGNOSIS — I10 Essential (primary) hypertension: Secondary | ICD-10-CM

## 2020-10-04 DIAGNOSIS — Z1211 Encounter for screening for malignant neoplasm of colon: Secondary | ICD-10-CM

## 2020-10-04 DIAGNOSIS — R7303 Prediabetes: Secondary | ICD-10-CM

## 2020-10-04 DIAGNOSIS — I77819 Aortic ectasia, unspecified site: Secondary | ICD-10-CM

## 2020-10-04 DIAGNOSIS — C099 Malignant neoplasm of tonsil, unspecified: Secondary | ICD-10-CM

## 2020-10-04 DIAGNOSIS — E785 Hyperlipidemia, unspecified: Secondary | ICD-10-CM

## 2020-10-04 DIAGNOSIS — I700 Atherosclerosis of aorta: Secondary | ICD-10-CM

## 2020-10-04 MED ORDER — LISINOPRIL 10 MG PO TABS: 10.0000 mg | ORAL_TABLET | Freq: Every day | ORAL | 3 refills | Status: DC

## 2020-10-04 MED ORDER — ATORVASTATIN CALCIUM 40 MG PO TABS: 40.0000 mg | ORAL_TABLET | Freq: Every day | ORAL | 3 refills | Status: DC

## 2020-10-04 NOTE — Assessment & Plan Note (Signed)
Latest carotid US 02/2020 reviewed.

## 2020-10-04 NOTE — Progress Notes (Signed)
Patient ID: William Johns, male    DOB: 1955-02-12, 65 y.o.   MRN: [id number]  This visit was conducted in person.  BP 138/84   Pulse 69   Temp 97.7 F (36.5 C) (Temporal)   Ht 5' 11"  (1.803 m)   Wt 224 lb (101.6 kg)   SpO2 95%   BMI 31.24 kg/m    CC: CPE Subjective:   HPI: William Johns is a 65 y.o. male presenting on 10/04/2020 for Annual Exam   Planning to transition to medicare later this year.   Tonsillar cancer 2020 s/p chemoradiation therapy completed 08/2018, sees Dr Baruch Gouty Q61mo Sees ENT ([facility])/onc (Teaching laboratory technician.   15 lb weight loss in the past 6 months - decreased portion sizes.   GERD controlled with daily low dose PPI - breakthrough symptoms if QOD dosing.   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   Preventative: Colon cancer screening - 11/2008 2 hyperplastic polyps rec rpt 10 yrs ([name]. Due for repeat - referral placed. States this wasn't scheduled last year.  Prostate cancer screening - No prostate cancer hx. Nocturia x1-2.  Lung cancer screening - ~30 PY hx. Quit 2009. fmhx lung cancer. Getting CT lungs yearly, last 03/2020  [place] shot [place]  [place] Pnemovax 2014, prevnar20 - due, declines Tdap 2013 Shingrix - 09/2019, 02/2020 Advanced directive - does not have set up. Health proxy would be [name]. Doesn't want prolonged life support if terminal condition.  Seat belt use discussed.  Sunscreen use discussed. No changing moles on skin.  Ex smoker - quit 2009  Alcohol - rare MJ - fully quit 2021, occasional now Eye exam - overdue  Dentist - not recently - worse dentition since chemo  Bowel - no constipation Bladder - no incontinence   "[name] Lives with girlfriend, and her son, 2 pets  Occupation: self employed, cOptometristin [employer]  Edu: HS  Activity: walks 1 mi occasionally  Diet: water, fruits/vegetables daily, diet sodas, decreased portion size      Relevant past  medical, surgical, family and social history reviewed and updated as indicated. Interim medical history since our last visit reviewed. Allergies and medications reviewed and updated. Outpatient Medications Prior to Visit  Medication Sig Dispense Refill   aspirin 81 MG tablet Take 81 mg by mouth daily.     ipratropium (ATROVENT) 0.06 % nasal spray Place into both nostrils.     Multiple Vitamin (MULTIVITAMIN) tablet Take 1 tablet by mouth daily.     OMEGA-3 FATTY ACIDS PO Take 1 capsule by mouth daily.     omeprazole (PRILOSEC OTC) 20 MG tablet Take 20 mg by mouth daily.     sildenafil (VIAGRA) 100 MG tablet Take 100 mg by mouth daily as needed for erectile dysfunction.     atorvastatin (LIPITOR) 40 MG tablet TAKE 1 TABLET BY MOUTH EVERY DAY 90 tablet 0   lisinopril (ZESTRIL) 10 MG tablet TAKE 1 TABLET BY MOUTH EVERY DAY 90 tablet 0   Blood Glucose Monitoring Suppl (BAYER CONTOUR MONITOR) w/Device KIT by Does not apply route. Use to check sugar daily and as needed Dx: E11.65 (Patient not taking: Reported on 02/12/2020)     glucose blood test strip 1 each by Other route as needed for other. Use as instructed to check sugar once daily and as needed. E11.65 **Contour** (Patient not taking: Reported on 02/12/2020)     Lancets MISC by Does not  apply route. Use as directed to check sugar once daily and as needed. Dx. E11.65 **Contour** (Patient not taking: No sig reported)     methocarbamol (ROBAXIN) 500 MG tablet Take 1 tablet (500 mg total) by mouth 3 (three) times daily as needed for muscle spasms (sedation precautions). (Patient not taking: Reported on 02/12/2020) 40 tablet 1   No facility-administered medications prior to visit.     Per HPI unless specifically indicated in ROS section below Review of Systems  Constitutional:  Negative for activity change, appetite change, chills, fatigue, fever and unexpected weight change.  HENT:  Negative for hearing loss.   Eyes:  Negative for visual disturbance.   Respiratory:  Negative for cough, chest tightness, shortness of breath and wheezing.   Cardiovascular:  Negative for chest pain, palpitations and leg swelling.  Gastrointestinal:  Negative for abdominal distention, abdominal pain, blood in stool, constipation, diarrhea, nausea and vomiting.  Genitourinary:  Negative for difficulty urinating and hematuria.  Musculoskeletal:  Negative for arthralgias, myalgias and neck pain.  Skin:  Negative for rash.  Neurological:  Negative for dizziness, seizures, syncope and headaches.  Hematological:  Negative for adenopathy. Bruises/bleeds easily.  Psychiatric/Behavioral:  Negative for dysphoric mood. The patient is not nervous/anxious.    Objective:  BP 138/84   Pulse 69   Temp 97.7 F (36.5 C) (Temporal)   Ht 5' 11"  (1.803 m)   Wt 224 lb (101.6 kg)   SpO2 95%   BMI 31.24 kg/m   Wt Readings from Last 3 Encounters:  10/04/20 224 lb (101.6 kg)  03/27/20 240 lb (108.9 kg)  02/12/20 234 lb (106.1 kg)      Physical Exam Vitals and nursing note reviewed.  Constitutional:      General: He is not in acute distress.    Appearance: Normal appearance. He is well-developed. He is not ill-appearing.  HENT:     Head: Normocephalic and atraumatic.     Right Ear: Hearing, tympanic membrane, ear canal and external ear normal.     Left Ear: Hearing, tympanic membrane, ear canal and external ear normal.  Eyes:     General: No scleral icterus.    Extraocular Movements: Extraocular movements intact.     Conjunctiva/sclera: Conjunctivae normal.     Pupils: Pupils are equal, round, and reactive to light.  Neck:     Thyroid: No thyroid mass or thyromegaly.     Vascular: No carotid bruit.  Cardiovascular:     Rate and Rhythm: Normal rate and regular rhythm.     Pulses: Normal pulses.          Radial pulses are 2+ on the right side and 2+ on the left side.     Heart sounds: Normal heart sounds. No murmur heard. Pulmonary:     Effort: Pulmonary effort is  normal. No respiratory distress.     Breath sounds: Normal breath sounds. No wheezing, rhonchi or rales.  Abdominal:     General: Bowel sounds are normal. There is no distension.     Palpations: Abdomen is soft. There is no mass.     Tenderness: There is no abdominal tenderness. There is no guarding or rebound.     Hernia: No hernia is present.  Musculoskeletal:        General: Normal range of motion.     Cervical back: Normal range of motion and neck supple.     Right lower leg: No edema.     Left lower leg: No edema.  Lymphadenopathy:  Cervical: No cervical adenopathy.  Skin:    General: Skin is warm and dry.     Findings: No rash.  Neurological:     General: No focal deficit present.     Mental Status: He is alert and oriented to person, place, and time.  Psychiatric:        Mood and Affect: Mood normal.        Behavior: Behavior normal.        Thought Content: Thought content normal.        Judgment: Judgment normal.      Results for orders placed or performed in visit on 09/27/20  CBC with Differential/Platelet  Result Value Ref Range   WBC 5.4 4.0 - 10.5 K/uL   RBC 4.73 4.22 - 5.81 Mil/uL   Hemoglobin 14.4 13.0 - 17.0 g/dL   HCT 43.2 39.0 - 52.0 %   MCV 91.3 78.0 - 100.0 fl   MCHC 33.4 30.0 - 36.0 g/dL   RDW 13.7 11.5 - 15.5 %   Platelets 148.0 (L) 150.0 - 400.0 K/uL   Neutrophils Relative % 73.2 43.0 - 77.0 %   Lymphocytes Relative 16.2 12.0 - 46.0 %   Monocytes Relative 6.6 3.0 - 12.0 %   Eosinophils Relative 3.2 0.0 - 5.0 %   Basophils Relative 0.8 0.0 - 3.0 %   Neutro Abs 3.9 1.4 - 7.7 K/uL   Lymphs Abs 0.9 0.7 - 4.0 K/uL   Monocytes Absolute 0.4 0.1 - 1.0 K/uL   Eosinophils Absolute 0.2 0.0 - 0.7 K/uL   Basophils Absolute 0.0 0.0 - 0.1 K/uL  PSA  Result Value Ref Range   PSA 2.18 0.10 - 4.00 ng/mL  Hemoglobin A1c  Result Value Ref Range   Hgb A1c MFr Bld 6.4 4.6 - 6.5 %  Comprehensive metabolic panel  Result Value Ref Range   Sodium 140 135 - 145  mEq/L   Potassium 4.1 3.5 - 5.1 mEq/L   Chloride 105 96 - 112 mEq/L   CO2 27 19 - 32 mEq/L   Glucose, Bld 118 (H) 70 - 99 mg/dL   BUN 17 6 - 23 mg/dL   Creatinine, Ser 1.37 0.40 - 1.50 mg/dL   Total Bilirubin 0.6 0.2 - 1.2 mg/dL   Alkaline Phosphatase 78 39 - 117 U/L   AST 17 0 - 37 U/L   ALT 18 0 - 53 U/L   Total Protein 6.3 6.0 - 8.3 g/dL   Albumin 4.0 3.5 - 5.2 g/dL   GFR 54.30 (L) >60.00 mL/min   Calcium 9.2 8.4 - 10.5 mg/dL  Lipid panel  Result Value Ref Range   Cholesterol 130 0 - 200 mg/dL   Triglycerides 201.0 (H) 0.0 - 149.0 mg/dL   HDL 28.70 (L) >39.00 mg/dL   VLDL 40.2 (H) 0.0 - 40.0 mg/dL   Total CHOL/HDL Ratio 5    NonHDL 101.11   LDL cholesterol, direct  Result Value Ref Range   Direct LDL 79.0 mg/dL    Assessment & Plan:  This visit occurred during the SARS-CoV-2 public health emergency.  Safety protocols were in place, including screening questions prior to the visit, additional usage of staff PPE, and extensive cleaning of exam room while observing appropriate contact time as indicated for disinfecting solutions.   Problem List Items Addressed This Visit     Health maintenance examination - Primary (Chronic)    Preventative protocols reviewed and updated unless pt declined. Discussed healthy diet and lifestyle.  Advanced directives, counseling/discussion (Chronic)    Advanced directive - does not have set up. Health proxy would be [name]. Doesn't want prolonged life support if terminal condition.       HTN (hypertension)    Chronic, stable. Continue lisinopril 58m daily.       Relevant Medications   atorvastatin (LIPITOR) 40 MG tablet   lisinopril (ZESTRIL) 10 MG tablet   Dyslipidemia    Chronic on atorvastatin - anticipate elevated triglycerides due to hyperglycemia. He will renew efforts at low sugar diet.  The 10-year ASCVD risk score (Mikey BussingDC JBrooke Bonito, et al., 2013) is: 30.3%   Values used to calculate the score:     Age: [age]years     Sex:  Male     Is Non-Hispanic African American: No     Diabetic: Yes     Tobacco smoker: No     Systolic Blood Pressure: 1884mmHg     Is BP treated: Yes     HDL Cholesterol: 28.7 mg/dL     Total Cholesterol: 130 mg/dL       Relevant Medications   atorvastatin (LIPITOR) 40 MG tablet   GERD (gastroesophageal reflux disease)    Continues omeprazole 270mdaily due to breakthrough symptoms if missed dose - discussed trying lower dose given weight loss.       Obesity, Class I, BMI 30-34.9    Congratulated on weight loss to date. Encouraged healthy diet and lifestyle choices to affect sustainable weight loss.       Prediabetes    Discussed trending sugar - he will renew efforts towards low sugar low carb diet to prevent return of diabetes.       Abdominal aortic atherosclerosis (HCC)    Continue aspirin, statin, good BP control      Relevant Medications   atorvastatin (LIPITOR) 40 MG tablet   lisinopril (ZESTRIL) 10 MG tablet   Aortic dilatation (HCC)    Reviewed latest imaging.  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 Appreciate rad onc and ENT care.       Carotid stenosis, asymptomatic, bilateral    [place]est carotid [place] reviewed.       Relevant Medications   atorvastatin (LIPITOR) 40 MG tablet   lisinopril (ZESTRIL) 10 MG tablet   Other Visit Diagnoses     Special screening for malignant neoplasms, colon       Relevant Orders   Ambulatory referral to Gastroenterology        Meds ordered this encounter  Medications   atorvastatin (LIPITOR) 40 MG tablet    Sig: Take 1 tablet (40 mg total) by mouth daily.    Dispense:  90 tablet    Refill:  3   lisinopril (ZESTRIL) 10 MG tablet    Sig: Take 1 tablet (10 mg total) by mouth daily.    Dispense:  90 tablet    Refill:  3    Orders Placed This  Encounter  Procedures   Ambulatory referral to Gastroenterology    Referral Priority:   Routine    Referral Type:   Consultation    Referral Reason:   Specialty Services Required    Number of Visits Requested:   1    Patient instructions: Try omeprazole 2087mvery other day or may try  pepcid 1m nightly - also over the counter.  You may call [facility] at ([phone] get your appointment scheduled.  You are due for updated pneumonia shot - let [place] when ready for this.  Advanced directive packet provided today.  Once you get medicare, you can return for a welcome to medicare visit.   Follow up plan: Return in about 1 year (around 10/04/2021) for annual exam, prior fasting for blood work.  JRia Bush MD

## 2020-10-04 NOTE — Assessment & Plan Note (Signed)
Discussed trending sugar - he will renew efforts towards low sugar low carb diet to prevent return of diabetes.

## 2020-10-04 NOTE — Assessment & Plan Note (Addendum)
Congratulated on weight loss to date. Encouraged healthy diet and lifestyle choices to affect sustainable weight loss.

## 2020-10-04 NOTE — Patient Instructions (Addendum)
Try omeprazole '20mg'$  every other day or may try pepcid '20mg'$  nightly - also over the counter.  You may call [facility] at [phone] to get your appointment scheduled.  You are due for updated pneumonia shot - let us know when ready for this.  Advanced directive packet provided today.  Once you get medicare, you can return for a welcome to medicare visit.   Health Maintenance After Age 65 After age 49, you are at a higher risk for certain long-term diseases and infections as well as injuries from falls. Falls are a major cause of broken bones and head injuries in people who are older than age 44. Getting regular preventive care can help to keep you healthy and well. Preventive care includes getting regular testing and making lifestyle changes as recommended by your health care provider. Talk with your health care provider about: Which screenings and tests you should have. A screening is a test that checks for a disease when you have no symptoms. A diet and exercise plan that is right for you. What should I know about screenings and tests to prevent falls? Screening and testing are the best ways to find a health problem early. Early diagnosis and treatment give you the best chance of managing medical conditions that are common after age 55. Certain conditions and lifestyle choices may make you more likely to have a fall. Your health care provider may recommend: Regular vision checks. Poor vision and conditions such as cataracts can make you more likely to have a fall. If you wear glasses, make sure to get your prescription updated if your vision changes. Medicine review. Work with your health care provider to regularly review all of the medicines you are taking, including over-the-counter medicines. Ask your health care provider about any side effects that may make you more likely to have a fall. Tell your health care provider if any medicines that you take make you feel dizzy or sleepy. Osteoporosis  screening. Osteoporosis is a condition that causes the bones to get weaker. This can make the bones weak and cause them to break more easily. Blood pressure screening. Blood pressure changes and medicines to control blood pressure can make you feel dizzy. Strength and balance checks. Your health care provider may recommend certain tests to check your strength and balance while standing, walking, or changing positions. Foot health exam. Foot pain and numbness, as well as not wearing proper footwear, can make you more likely to have a fall. Depression screening. You may be more likely to have a fall if you have a fear of falling, feel emotionally low, or feel unable to do activities that you used to do. Alcohol use screening. Using too much alcohol can affect your balance and may make you more likely to have a fall. What actions can I take to lower my risk of falls? General instructions Talk with your health care provider about your risks for falling. Tell your health care provider if: You fall. Be sure to tell your health care provider about all falls, even ones that seem minor. You feel dizzy, sleepy, or off-balance. Take over-the-counter and prescription medicines only as told by your health care provider. These include any supplements. Eat a healthy diet and maintain a healthy weight. A healthy diet includes low-fat dairy products, low-fat (lean) meats, and fiber from whole grains, beans, and lots of fruits and vegetables. Home safety Remove any tripping hazards, such as rugs, cords, and clutter. Install safety equipment such as grab  bars in bathrooms and safety rails on stairs. Keep rooms and walkways well-lit. Activity  Follow a regular exercise program to stay fit. This will help you maintain your balance. Ask your health care provider what types of exercise are appropriate for you. If you need a cane or walker, use it as recommended by your health care provider. Wear supportive shoes that  have nonskid soles. Lifestyle Do not drink alcohol if your health care provider tells you not to drink. If you drink alcohol, limit how much you have: 0-1 drink a day for women. 0-2 drinks a day for men. Be aware of how much alcohol is in your drink. In the [place] one drink equals one typical bottle of beer (12 oz), one-half glass of wine (5 oz), or one shot of hard liquor (1 oz). Do not use any products that contain nicotine or tobacco, such as cigarettes and e-cigarettes. If you need help quitting, ask your health care provider. Summary Having a healthy lifestyle and getting preventive care can help to protect your health and wellness after age 72. Screening and testing are the best way to find a health problem early and help you avoid having a fall. Early diagnosis and treatment give you the best chance for managing medical conditions that are more common for people who are older than age 78. Falls are a major cause of broken bones and head injuries in people who are older than age 33. Take precautions to prevent a fall at home. Work with your health care provider to learn what changes you can make to improve your health and wellness and to prevent falls. This information is not intended to replace advice given to you by your health care provider. Make sure you discuss any questions you have with your health care provider. Document Revised: 04/05/2020 Document Reviewed: 01/12/2020 Elsevier Patient Education  2022 [employer].

## 2020-10-04 NOTE — Assessment & Plan Note (Signed)
Continue aspirin, statin, good BP control

## 2020-10-04 NOTE — Assessment & Plan Note (Signed)
Continues omeprazole '20mg'$  daily due to breakthrough symptoms if missed dose - discussed trying lower dose given weight loss.

## 2020-10-04 NOTE — Assessment & Plan Note (Signed)
Chronic, stable. Continue lisinopril 10mg daily. 

## 2020-10-04 NOTE — Assessment & Plan Note (Signed)
History of this. Appreciate rad onc and ENT care.

## 2020-10-04 NOTE — Assessment & Plan Note (Signed)
Chronic on atorvastatin - anticipate elevated triglycerides due to hyperglycemia. He will renew efforts at low sugar diet.  The 10-year ASCVD risk score Mikey Bussing DC Brooke Bonito., et al., 2013) is: 30.3%   Values used to calculate the score:     Age: 65 years     Sex: Male     Is Non-Hispanic African American: No     Diabetic: Yes     Tobacco smoker: No     Systolic Blood Pressure: 0000000 mmHg     Is BP treated: Yes     HDL Cholesterol: 28.7 mg/dL     Total Cholesterol: 130 mg/dL

## 2020-10-04 NOTE — Assessment & Plan Note (Addendum)
Reviewed latest imaging.  Continue aspirin, statin, good BP control

## 2020-10-04 NOTE — Assessment & Plan Note (Signed)
Preventative protocols reviewed and updated unless pt declined. Discussed healthy diet and lifestyle.  

## 2020-10-04 NOTE — Assessment & Plan Note (Signed)
Advanced directive - does not have set up. Health proxy would be [name]. Doesn't want prolonged life support if terminal condition.

## 2020-12-30 ENCOUNTER — Encounter: Payer: Self-pay | Admitting: Oncology

## 2021-01-01 ENCOUNTER — Encounter: Payer: Self-pay | Admitting: Oncology

## 2021-01-06 ENCOUNTER — Encounter: Payer: Self-pay | Admitting: Oncology

## 2021-01-08 ENCOUNTER — Ambulatory Visit: Payer: Medicare HMO | Admitting: Radiation Oncology

## 2021-01-13 ENCOUNTER — Encounter: Payer: Self-pay | Admitting: Oncology

## 2021-01-14 ENCOUNTER — Other Ambulatory Visit: Payer: Self-pay

## 2021-01-14 ENCOUNTER — Ambulatory Visit
Admission: RE | Admit: 2021-01-14 | Discharge: 2021-01-14 | Disposition: A | Discharge status: Home / Self Care | Payer: Medicare HMO | Source: Ambulatory Visit | Attending: Urology | Admitting: Urology

## 2021-01-14 DIAGNOSIS — N210 Calculus in bladder: Secondary | ICD-10-CM | POA: Diagnosis not present

## 2021-01-14 DIAGNOSIS — Z87442 Personal history of urinary calculi: Secondary | ICD-10-CM

## 2021-01-14 DIAGNOSIS — N2889 Other specified disorders of kidney and ureter: Secondary | ICD-10-CM | POA: Diagnosis not present

## 2021-01-15 ENCOUNTER — Encounter: Payer: Self-pay | Admitting: Urology

## 2021-01-15 ENCOUNTER — Other Ambulatory Visit: Payer: Self-pay | Admitting: Urology

## 2021-01-15 ENCOUNTER — Ambulatory Visit (INDEPENDENT_AMBULATORY_CARE_PROVIDER_SITE_OTHER): Payer: Medicare HMO | Admitting: Urology

## 2021-01-15 VITALS — BP 161/100 | HR 97 | Ht 71.0 in | Wt 223.7 lb

## 2021-01-15 DIAGNOSIS — N201 Calculus of ureter: Secondary | ICD-10-CM

## 2021-01-15 LAB — MICROSCOPIC EXAMINATION: Epithelial Cells (non renal): NONE SEEN /hpf (ref 0–10)

## 2021-01-15 LAB — URINALYSIS, COMPLETE
Bilirubin, UA: NEGATIVE
Glucose, UA: NEGATIVE
Ketones, UA: NEGATIVE
Nitrite, UA: NEGATIVE
Specific Gravity, UA: 1.02 (ref 1.005–1.030)
Urobilinogen, Ur: 0.2 mg/dL (ref 0.2–1.0)
pH, UA: 7 (ref 5.0–7.5)

## 2021-01-15 MED ORDER — HYDROCODONE-ACETAMINOPHEN 5-325 MG PO TABS: 1.0000 | ORAL_TABLET | Freq: Four times a day (QID) | ORAL | 0 refills | Status: AC | PRN

## 2021-01-15 NOTE — Patient Instructions (Signed)
Lithotripsy Lithotripsy is a treatment that can help break up kidney stones that are too large to pass on their own. This is a nonsurgical procedure that crushes a kidney stone with shock waves. These shock waves pass through your body and focus on the kidney stone. They cause the kidney stone to break up into smaller pieces while it is still in the urinary tract. The smaller pieces of stone can pass more easily out of your body in the urine. Tell a health care provider about: Any allergies you have. All medicines you are taking, including vitamins, herbs, eye drops, creams, and over-the-counter medicines. Any problems you or family members have had with anesthetic medicines. Any blood disorders you have. Any surgeries you have had. Any medical conditions you have. Whether you are pregnant or may be pregnant. What are the risks? Generally, this is a safe procedure. However, problems may occur, including: Infection. Bleeding from the kidney. Bruising of the kidney or skin. Scarring of the kidney, which can lead to: Increased blood pressure. Poor kidney function. Return (recurrence) of kidney stones. Damage to other structures or organs, such as the liver, colon, spleen, or pancreas. Blockage (obstruction) of the tube that carries urine from the kidney to the bladder (ureter). Failure of the kidney stone to break into pieces (fragments). What happens before the procedure? Staying hydrated Follow instructions from your health care provider about hydration, which may include: Up to 2 hours before the procedure - you may continue to drink clear liquids, such as water, clear fruit juice, black coffee, and plain tea. Eating and drinking restrictions Follow instructions from your health care provider about eating and drinking, which may include: 8 hours before the procedure - stop eating heavy meals or foods, such as meat, fried foods, or fatty foods. 6 hours before the procedure - stop eating  light meals or foods, such as toast or cereal. 6 hours before the procedure - stop drinking milk or drinks that contain milk. 2 hours before the procedure - stop drinking clear liquids. Medicines Ask your health care provider about: Changing or stopping your regular medicines. This is especially important if you are taking diabetes medicines or blood thinners. Taking medicines such as aspirin and ibuprofen. These medicines can thin your blood. Do not take these medicines unless your health care provider tells you to take them. Taking over-the-counter medicines, vitamins, herbs, and supplements. Tests You may have tests, such as: Blood tests. Urine tests. Imaging tests, such as a CT scan. General instructions Plan to have someone take you home from the hospital or clinic. If you will be going home right after the procedure, plan to have someone with you for 24 hours. Ask your health care provider what steps will be taken to help prevent infection. These may include washing skin with a germ-killing soap. What happens during the procedure?  An IV will be inserted into one of your veins. You will be given one or more of the following: A medicine to help you relax (sedative). A medicine to make you fall asleep (general anesthetic). A water-filled cushion may be placed behind your kidney or on your abdomen. In some cases, you may be placed in a tub of lukewarm water. Your body will be positioned in a way that makes it easy to target the kidney stone. An X-ray or ultrasound exam will be done to locate your stone. Shock waves will be aimed at the stone. If you are awake, you may feel a tapping sensation as  the shock waves pass through your body. A flexible tube with holes in it (stent) may be placed in the ureter. This will help keep urine flowing from the kidney if the fragments of the stone have been blocking the ureter. The procedure may vary among health care providers and hospitals. What  happens after the procedure? You may have an X-ray to see whether the procedure was able to break up the kidney stone and how much of the stone has passed. If large stone fragments remain after treatment, you may need to have a second procedure at a later time. Your blood pressure, heart rate, breathing rate, and blood oxygen level will be monitored until you leave the hospital or clinic. You may be given antibiotics or pain medicine as needed. If a stent was placed in your ureter during surgery, it may stay in place for a few weeks. You may need to strain your urine to collect pieces of the kidney stone for testing. You will need to drink plenty of water. If you were given a sedative during the procedure, it can affect you for several hours. Do not drive or operate machinery until your health care provider says that it is safe. Summary Lithotripsy is a treatment that can help break up kidney stones that are too large to pass on their own. Lithotripsy is a nonsurgical procedure that crushes a kidney stone with shock waves. Generally, this is a safe procedure. However, problems may occur, including damage to the kidney or other organs, infection, or obstruction of the tube that carries urine from the kidney to the bladder (ureter). You may have a stent placed in your ureter to help drain your urine. This stent may stay in place for a few weeks. After the procedure, you will need to drink plenty of water. You may be asked to strain your urine to collect pieces of the kidney stone for testing. This information is not intended to replace advice given to you by your health care provider. Make sure you discuss any questions you have with your health care provider. Document Revised: 11/08/2018 Document Reviewed: 11/09/2018 Elsevier Patient Education  [employer].

## 2021-01-15 NOTE — Progress Notes (Signed)
   01/15/2021 10:08 AM   William Johns. [birth year] [id number]  Reason for visit: Right flank and groin pain, penile pain  HPI: 65 year old male who underwent left shockwave lithotripsy with me in March 2020 for a 8 mm distal ureteral stone and a significantly atrophic left kidney.  Follow-up KUB showed clearance of all left-sided fragments, and his pain resolved.  We had recommended follow-up to consider intervention for his large upper pole right-sided stone at that time in the setting of his atrophic left kidney, and a renal ultrasound, however he was diagnosed with squamous cell carcinoma of the right tonsil and had been dealing with that and did not follow-up with [place].  He reports a few weeks of right-sided flank and groin pain, and I personally viewed and interpreted the KUB yesterday that shows a large 10 mm right distal ureteral stone.  This represents migration of the previously seen right upper pole stone from prior CT in 2020.  He reports he is continue to produce urine and has had some intermittent gross hematuria.  He denies any fevers or chills.  He has not had any lab work done, and voided before coming to clinic so has been unable to give a urine sample thus far.  He has not tolerated ureteral stents in the past, and strongly prefers shockwave lithotripsy.  We discussed various treatment options for urolithiasis including observation with or without medical expulsive therapy, shockwave lithotripsy (SWL), ureteroscopy and laser lithotripsy with stent placement, and percutaneous nephrolithotomy.  We discussed that management is based on stone size, location, density, patient co-morbidities, and patient preference.   Stones <21mm in size have a >80% spontaneous passage rate. Data surrounding the use of tamsulosin for medical expulsive therapy is controversial, but meta analyses suggests it is most efficacious for distal stones between 5-45mm in size. Possible side effects include  dizziness/lightheadedness, and retrograde ejaculation.  SWL has a lower stone free rate in a single procedure, but also a lower complication rate compared to ureteroscopy and avoids a stent and associated stent related symptoms. Possible complications include renal hematoma, steinstrasse, and need for additional treatment.  Ureteroscopy with laser lithotripsy and stent placement has a higher stone free rate than SWL in a single procedure, however increased complication rate including possible infection, ureteral injury, bleeding, and stent related morbidity. Common stent related symptoms include dysuria, urgency/frequency, and flank pain.  He is adamantly opposed to ureteroscopy and stent placement, as he has not tolerated ureteral stents in the past.  He would like to pursue right sided shockwave lithotripsy tomorrow.  We discussed the possible risks of retained fragments that would necessitate ureteroscopy, especially in the setting of his atrophic left kidney.  He understands these risks and would like to proceed with shockwave lithotripsy.   Billey Co, [facility] [address] [place], [place] [address] [phone]

## 2021-01-15 NOTE — Addendum Note (Signed)
Addended by: Alvera Novel on: 01/15/2021 11:39 AM   Modules accepted: Orders

## 2021-01-15 NOTE — Progress Notes (Signed)
ESWL ORDER FORM  Expected date of procedure: Thursday, 01/16/2021  Surgeon: Nickolas Madrid, MD  Post op standing: 2-4wk follow up w/KUB prior with PA  Anticoagulation/Aspirin/NSAID standing order: Okay to continue aspirin and NSAIDs (distal stone)  Anesthesia standing order: MAC  VTE standing: SCD's  Dx: Right Ureteral Stone  Procedure: right Extracorporeal shock wave lithotripsy  CPT : 04888  Standing Order Set:   *NPO after mn, KUB  *NS 11m/hr, Keflex 5072mPO, Benadryl 2544mO, Valium 45m3m, Zofran 4mg 36m   Medications if other than standing orders:   none

## 2021-01-16 ENCOUNTER — Encounter: Payer: Self-pay | Admitting: Anesthesiology

## 2021-01-16 ENCOUNTER — Ambulatory Visit
Admission: RE | Admit: 2021-01-16 | Discharge: 2021-01-16 | Disposition: A | Discharge status: Home / Self Care | Payer: Medicare HMO | Source: Ambulatory Visit | Attending: Urology | Admitting: Urology

## 2021-01-16 ENCOUNTER — Other Ambulatory Visit: Payer: Self-pay

## 2021-01-16 ENCOUNTER — Encounter: Payer: Self-pay | Admitting: Urology

## 2021-01-16 ENCOUNTER — Ambulatory Visit: Payer: Medicare HMO

## 2021-01-16 ENCOUNTER — Encounter
Admission: RE | Disposition: A | Discharge status: Home / Self Care | Payer: Self-pay | Source: Ambulatory Visit | Attending: Urology

## 2021-01-16 DIAGNOSIS — Z87891 Personal history of nicotine dependence: Secondary | ICD-10-CM | POA: Diagnosis not present

## 2021-01-16 DIAGNOSIS — N261 Atrophy of kidney (terminal): Secondary | ICD-10-CM | POA: Diagnosis not present

## 2021-01-16 DIAGNOSIS — E119 Type 2 diabetes mellitus without complications: Secondary | ICD-10-CM | POA: Insufficient documentation

## 2021-01-16 DIAGNOSIS — N201 Calculus of ureter: Secondary | ICD-10-CM

## 2021-01-16 DIAGNOSIS — Z87442 Personal history of urinary calculi: Secondary | ICD-10-CM | POA: Diagnosis not present

## 2021-01-16 HISTORY — PX: EXTRACORPOREAL SHOCK WAVE LITHOTRIPSY: SHX1557

## 2021-01-16 LAB — GLUCOSE, CAPILLARY: Glucose-Capillary: 117 mg/dL — ABNORMAL HIGH (ref 70–99)

## 2021-01-16 SURGERY — LITHOTRIPSY, ESWL
Anesthesia: Moderate Sedation | Laterality: Right

## 2021-01-16 MED ORDER — DIAZEPAM 5 MG PO TABS
ORAL_TABLET | ORAL | Status: AC
  Administered 2021-01-16: 10 mg via ORAL
  Filled 2021-01-16: qty 2

## 2021-01-16 MED ORDER — ONDANSETRON HCL 4 MG/2ML IJ SOLN
INTRAMUSCULAR | Status: AC
  Administered 2021-01-16: 4 mg via INTRAVENOUS
  Filled 2021-01-16: qty 2

## 2021-01-16 MED ORDER — DIPHENHYDRAMINE HCL 25 MG PO CAPS: 25.0000 mg | ORAL_CAPSULE | ORAL | Status: AC

## 2021-01-16 MED ORDER — ONDANSETRON HCL 4 MG/2ML IJ SOLN: 4.0000 mg | Freq: Once | INTRAMUSCULAR | Status: AC

## 2021-01-16 MED ORDER — SODIUM CHLORIDE 0.9 % IV SOLN: INTRAVENOUS | Status: DC

## 2021-01-16 MED ORDER — DIAZEPAM 5 MG PO TABS: 10.0000 mg | ORAL_TABLET | ORAL | Status: AC

## 2021-01-16 MED ORDER — DIPHENHYDRAMINE HCL 25 MG PO CAPS
ORAL_CAPSULE | ORAL | Status: AC
  Administered 2021-01-16: 25 mg via ORAL
  Filled 2021-01-16: qty 1

## 2021-01-16 MED ORDER — TAMSULOSIN HCL 0.4 MG PO CAPS: 0.4000 mg | ORAL_CAPSULE | Freq: Every day | ORAL | 0 refills | Status: DC

## 2021-01-16 MED ORDER — FLUCONAZOLE 100 MG PO TABS: 100.0000 mg | ORAL_TABLET | Freq: Every day | ORAL | 0 refills | Status: AC

## 2021-01-16 MED ORDER — CEPHALEXIN 500 MG PO CAPS: 500.0000 mg | ORAL_CAPSULE | Freq: Once | ORAL | Status: AC

## 2021-01-16 MED ORDER — CEPHALEXIN 500 MG PO CAPS
ORAL_CAPSULE | ORAL | Status: AC
  Administered 2021-01-16: 500 mg via ORAL
  Filled 2021-01-16: qty 1

## 2021-01-16 NOTE — Brief Op Note (Signed)
01/16/2021  10:07 AM  PATIENT:  William Johns.  65 y.o. male  PRE-OPERATIVE DIAGNOSIS:  55mm Right distal Ureteral Stone  POST-OPERATIVE DIAGNOSIS:  Same  PROCEDURE:  Procedure(s): EXTRACORPOREAL SHOCK WAVE LITHOTRIPSY (ESWL) (Right)  SURGEON:  Surgeon(s) and Role:    * Billey Co, MD - Primary  ANESTHESIA: Conscious Sedation  EBL:  None  Drains: None  Specimen: None  Findings:  1.Excellent fragmentation of large distal ureteral stone, tolerated well  DISPO: Flomax, pain meds PRN, RTC 2 weeks KUB  Nickolas Madrid, MD 01/16/2021

## 2021-01-16 NOTE — H&P (Signed)
   01/16/21 7:31 AM   William Johns. 1955/12/05 [id number]  CC: right distal ureteral stone  HPI: 65 year old male with atrophic left kidney, right sided groin pain x2 weeks who presented to clinic yesterday and KUB consistent with 1cm right distal ureteral stone (migrated from upper pole on prior CT). He denies fevers or chills, UA relatively benign. Continues to make urine.   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    T2DM (type 2 diabetes mellitus) ([place])    DSME at [facility] 01/2012   Tonsillar mass 05/23/2018    Surgical History: Past Surgical History:  Procedure Laterality Date   CHOLECYSTECTOMY  2007   COLONOSCOPY  11/2008   2 hyperplastic polyps [name])   EXTRACORPOREAL SHOCK WAVE LITHOTRIPSY Left 04/28/2018   Procedure: EXTRACORPOREAL SHOCK WAVE LITHOTRIPSY (ESWL);  Surgeon: Billey Co, MD;  Location: [facility] ORS;  Service: Urology;  Laterality: Left;   LITHOTRIPSY  2013   x 2     Family History: Family History  Problem Relation Age of Onset   Cancer Father 5       lung, smoker   Aneurysm Mother 51       brain   Coronary artery disease Neg Hx    Stroke Neg Hx    Diabetes Neg Hx     Social History:  reports that he quit smoking about 13 years ago. His smoking use included cigarettes. He has a 36.00 pack-year smoking history. He has never used smokeless tobacco. He reports current alcohol use. He reports current drug use.  Physical Exam:  Constitutional:  Alert and oriented, No acute distress. Cardiovascular: RRR Respiratory: CTA bilaterally GI: Abdomen is soft, nontender, nondistended, no abdominal masses   Laboratory Data: UA 11-30 WBCs, 0-2 RBCs few bacteria   Assessment  & Plan:   65 year old male with 1cm right distal ureteral stone, atrophic left kidney. I recommended ureteroscopy with his atrophic left kidney, but he has not tolerated stents well in the past and opts for SWL. We discussed risks of bleeding, hematoma, infection, obstructive fragments/steinstrasse, post op pain, and potential need for additional procedures.  Right Shockwave lithotripsy today   Nickolas Madrid, MD 01/16/2021  [place] [address] [place], [place] [address] [phone]

## 2021-01-16 NOTE — Discharge Instructions (Signed)

## 2021-01-20 ENCOUNTER — Other Ambulatory Visit: Payer: Self-pay

## 2021-01-20 DIAGNOSIS — N201 Calculus of ureter: Secondary | ICD-10-CM

## 2021-02-06 ENCOUNTER — Ambulatory Visit: Payer: Medicare HMO | Admitting: Physician Assistant

## 2021-02-06 ENCOUNTER — Ambulatory Visit: Payer: Medicare HMO | Admitting: Radiation Oncology

## 2021-03-05 ENCOUNTER — Ambulatory Visit: Payer: Medicare HMO | Admitting: Radiation Oncology

## 2021-03-26 DIAGNOSIS — J300 Vasomotor rhinitis: Secondary | ICD-10-CM | POA: Diagnosis not present

## 2021-03-26 DIAGNOSIS — Z85818 Personal history of malignant neoplasm of other sites of lip, oral cavity, and pharynx: Secondary | ICD-10-CM | POA: Diagnosis not present

## 2021-04-07 ENCOUNTER — Other Ambulatory Visit: Payer: Self-pay

## 2021-04-07 ENCOUNTER — Ambulatory Visit
Admission: RE | Admit: 2021-04-07 | Discharge: 2021-04-07 | Disposition: A | Discharge status: Home / Self Care | Payer: Medicare HMO | Source: Ambulatory Visit | Attending: Radiation Oncology | Admitting: Radiation Oncology

## 2021-04-07 VITALS — BP 122/77 | HR 70 | Temp 98.0°F | Resp 18 | Ht 71.0 in | Wt 223.0 lb

## 2021-04-07 DIAGNOSIS — Z923 Personal history of irradiation: Secondary | ICD-10-CM | POA: Insufficient documentation

## 2021-04-07 DIAGNOSIS — Z08 Encounter for follow-up examination after completed treatment for malignant neoplasm: Secondary | ICD-10-CM | POA: Diagnosis not present

## 2021-04-07 DIAGNOSIS — Z9221 Personal history of antineoplastic chemotherapy: Secondary | ICD-10-CM | POA: Insufficient documentation

## 2021-04-07 DIAGNOSIS — C099 Malignant neoplasm of tonsil, unspecified: Secondary | ICD-10-CM

## 2021-04-07 DIAGNOSIS — Z85819 Personal history of malignant neoplasm of unspecified site of lip, oral cavity, and pharynx: Secondary | ICD-10-CM | POA: Diagnosis not present

## 2021-04-07 NOTE — Progress Notes (Signed)
Radiation Oncology Follow up Note  Name: William Johns.   Date:   04/07/2021 MRN:  [id number] DOB: 09-Dec-1955    This 66 y.o. 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  He is having problems with his teeth have apparently deteriorated significantly although there were poor condition prior to leaving treatment.  He has been having regular follow-ups with ENT showing no evidence of disease..  COMPLICATIONS OF TREATMENT: none  FOLLOW UP COMPLIANCE: keeps appointments   PHYSICAL EXAM:  BP 122/77    Pulse 70    Temp 98 F (36.7 C)    Resp 18    Ht 5\' 11"  (1.803 m)    Wt 223 lb (101.2 kg)    BMI 31.10 kg/m  Oral cavity shows teeth in a poor state of repair.  No evidence of cervical or supraclavicular adenopathy is noted.  Well-developed well-nourished patient in NAD. HEENT reveals PERLA, EOMI, discs not visualized.  Oral cavity is clear. No oral mucosal lesions are identified. Neck is clear without evidence of cervical or supraclavicular adenopathy. Lungs are clear to A&P. Cardiac examination is essentially unremarkable with regular rate and rhythm without murmur rub or thrill. Abdomen is benign with no organomegaly or masses noted. Motor sensory and DTR levels are equal and symmetric in the upper and lower extremities. Cranial nerves II through XII are grossly intact. Proprioception is intact. No peripheral adenopathy or edema is identified. No motor or sensory levels are noted. Crude visual fields are within normal range.  RADIOLOGY  RESULTS: No current films for review  PLAN: At this time now that he is at close to 3 years from treatment I am going to turn follow-up care over to ENT.  I would be happy to reevaluate the patient anytime should further consultation be indicated.  Patient is to call with any concerns.  He will be seeing a dentist about possible teeth extractions which I have been strongly advised.  I would like to take this opportunity to thank you for allowing me to participate in the care of your patient.Noreene Filbert, MD

## 2021-04-18 ENCOUNTER — Encounter: Payer: Self-pay | Admitting: Urology

## 2021-05-02 ENCOUNTER — Other Ambulatory Visit: Payer: Self-pay | Admitting: *Deleted

## 2021-05-02 DIAGNOSIS — Z87891 Personal history of nicotine dependence: Secondary | ICD-10-CM

## 2021-05-13 ENCOUNTER — Ambulatory Visit
Admission: RE | Admit: 2021-05-13 | Discharge: 2021-05-13 | Disposition: A | Discharge status: Home / Self Care | Payer: Medicare HMO | Source: Ambulatory Visit | Attending: Acute Care | Admitting: Acute Care

## 2021-05-13 DIAGNOSIS — J439 Emphysema, unspecified: Secondary | ICD-10-CM | POA: Insufficient documentation

## 2021-05-13 DIAGNOSIS — Z87891 Personal history of nicotine dependence: Secondary | ICD-10-CM | POA: Diagnosis not present

## 2021-05-13 DIAGNOSIS — I2510 Atherosclerotic heart disease of native coronary artery without angina pectoris: Secondary | ICD-10-CM | POA: Insufficient documentation

## 2021-05-13 DIAGNOSIS — I700 Atherosclerosis of aorta: Secondary | ICD-10-CM | POA: Diagnosis not present

## 2021-06-28 ENCOUNTER — Other Ambulatory Visit: Payer: Self-pay | Admitting: Family Medicine

## 2021-06-28 DIAGNOSIS — Z125 Encounter for screening for malignant neoplasm of prostate: Secondary | ICD-10-CM

## 2021-06-28 DIAGNOSIS — C099 Malignant neoplasm of tonsil, unspecified: Secondary | ICD-10-CM

## 2021-06-28 DIAGNOSIS — E785 Hyperlipidemia, unspecified: Secondary | ICD-10-CM

## 2021-06-28 DIAGNOSIS — R7303 Prediabetes: Secondary | ICD-10-CM

## 2021-07-01 ENCOUNTER — Other Ambulatory Visit (INDEPENDENT_AMBULATORY_CARE_PROVIDER_SITE_OTHER): Payer: Medicare HMO

## 2021-07-01 DIAGNOSIS — E785 Hyperlipidemia, unspecified: Secondary | ICD-10-CM

## 2021-07-01 DIAGNOSIS — Z125 Encounter for screening for malignant neoplasm of prostate: Secondary | ICD-10-CM

## 2021-07-01 DIAGNOSIS — R7303 Prediabetes: Secondary | ICD-10-CM

## 2021-07-01 DIAGNOSIS — C099 Malignant neoplasm of tonsil, unspecified: Secondary | ICD-10-CM

## 2021-07-01 LAB — COMPREHENSIVE METABOLIC PANEL
ALT: 23 U/L (ref 0–53)
AST: 19 U/L (ref 0–37)
Albumin: 4.1 g/dL (ref 3.5–5.2)
Alkaline Phosphatase: 77 U/L (ref 39–117)
BUN: 13 mg/dL (ref 6–23)
CO2: 29 mEq/L (ref 19–32)
Calcium: 9.9 mg/dL (ref 8.4–10.5)
Chloride: 105 mEq/L (ref 96–112)
Creatinine, Ser: 1.22 mg/dL (ref 0.40–1.50)
GFR: 62.08 mL/min (ref >60.00)
Glucose, Bld: 124 mg/dL — ABNORMAL HIGH (ref 70–99)
Potassium: 4.1 mEq/L (ref 3.5–5.1)
Sodium: 142 mEq/L (ref 135–145)
Total Bilirubin: 0.7 mg/dL (ref 0.2–1.2)
Total Protein: 6.4 g/dL (ref 6.0–8.3)

## 2021-07-01 LAB — CBC WITH DIFFERENTIAL/PLATELET
Basophils Absolute: 0.1 10*3/uL (ref 0.0–0.1)
Basophils Relative: 1.3 % (ref 0.0–3.0)
Eosinophils Absolute: 0.2 10*3/uL (ref 0.0–0.7)
Eosinophils Relative: 3 % (ref 0.0–5.0)
HCT: 44.9 % (ref 39.0–52.0)
Hemoglobin: 15.3 g/dL (ref 13.0–17.0)
Lymphocytes Relative: 13.9 % (ref 12.0–46.0)
Lymphs Abs: 0.9 10*3/uL (ref 0.7–4.0)
MCHC: 34.1 g/dL (ref 30.0–36.0)
MCV: 91 fl (ref 78.0–100.0)
Monocytes Absolute: 0.5 10*3/uL (ref 0.1–1.0)
Monocytes Relative: 6.8 % (ref 3.0–12.0)
Neutro Abs: 5.1 10*3/uL (ref 1.4–7.7)
Neutrophils Relative %: 75 % (ref 43.0–77.0)
Platelets: 168 10*3/uL (ref 150.0–400.0)
RBC: 4.94 Mil/uL (ref 4.22–5.81)
RDW: 14.1 % (ref 11.5–15.5)
WBC: 6.7 10*3/uL (ref 4.0–10.5)

## 2021-07-01 LAB — HEMOGLOBIN A1C: Hgb A1c MFr Bld: 6.9 % — ABNORMAL HIGH (ref 4.6–6.5)

## 2021-07-01 LAB — LDL CHOLESTEROL, DIRECT: Direct LDL: 74 mg/dL

## 2021-07-01 LAB — LIPID PANEL
Cholesterol: 143 mg/dL (ref 0–200)
HDL: 28 mg/dL — ABNORMAL LOW (ref >39.00)
NonHDL: 115.04
Total CHOL/HDL Ratio: 5
Triglycerides: 288 mg/dL — ABNORMAL HIGH (ref 0.0–149.0)
VLDL: 57.6 mg/dL — ABNORMAL HIGH (ref 0.0–40.0)

## 2021-07-01 LAB — PSA: PSA: 2.46 ng/mL (ref 0.10–4.00)

## 2021-07-08 ENCOUNTER — Encounter: Payer: Self-pay | Admitting: Family Medicine

## 2021-07-08 ENCOUNTER — Ambulatory Visit (INDEPENDENT_AMBULATORY_CARE_PROVIDER_SITE_OTHER): Payer: Medicare HMO | Admitting: Family Medicine

## 2021-07-08 VITALS — BP 132/78 | HR 87 | Temp 97.8°F | Ht 69.5 in | Wt 228.1 lb

## 2021-07-08 DIAGNOSIS — I10 Essential (primary) hypertension: Secondary | ICD-10-CM

## 2021-07-08 DIAGNOSIS — I700 Atherosclerosis of aorta: Secondary | ICD-10-CM | POA: Diagnosis not present

## 2021-07-08 DIAGNOSIS — Z7189 Other specified counseling: Secondary | ICD-10-CM | POA: Diagnosis not present

## 2021-07-08 DIAGNOSIS — E669 Obesity, unspecified: Secondary | ICD-10-CM | POA: Diagnosis not present

## 2021-07-08 DIAGNOSIS — K219 Gastro-esophageal reflux disease without esophagitis: Secondary | ICD-10-CM

## 2021-07-08 DIAGNOSIS — E1169 Type 2 diabetes mellitus with other specified complication: Secondary | ICD-10-CM | POA: Diagnosis not present

## 2021-07-08 DIAGNOSIS — Z1211 Encounter for screening for malignant neoplasm of colon: Secondary | ICD-10-CM

## 2021-07-08 DIAGNOSIS — Z23 Encounter for immunization: Secondary | ICD-10-CM

## 2021-07-08 DIAGNOSIS — I77819 Aortic ectasia, unspecified site: Secondary | ICD-10-CM

## 2021-07-08 DIAGNOSIS — C099 Malignant neoplasm of tonsil, unspecified: Secondary | ICD-10-CM

## 2021-07-08 DIAGNOSIS — I6523 Occlusion and stenosis of bilateral carotid arteries: Secondary | ICD-10-CM

## 2021-07-08 DIAGNOSIS — E785 Hyperlipidemia, unspecified: Secondary | ICD-10-CM

## 2021-07-08 DIAGNOSIS — Z0000 Encounter for general adult medical examination without abnormal findings: Secondary | ICD-10-CM | POA: Insufficient documentation

## 2021-07-08 MED ORDER — METFORMIN HCL 500 MG PO TABS: 500.0000 mg | ORAL_TABLET | Freq: Every day | ORAL | 3 refills | Status: DC

## 2021-07-08 NOTE — Assessment & Plan Note (Signed)
I have personally reviewed the Medicare Annual Wellness questionnaire and have noted 1. The patient's medical and social history 2. Their use of alcohol, tobacco or illicit drugs 3. Their current medications and supplements 4. The patient's functional ability including ADL's, fall risks, home safety risks and hearing or visual impairment. Cognitive function has been assessed and addressed as indicated.  5. Diet and physical activity 6. Evidence for depression or mood disorders The patients weight, height, BMI have been recorded in the chart. I have made referrals, counseling and provided education to the patient based on review of the above and I have provided the pt with a written personalized care plan for preventive services. Provider list updated.. See scanned questionairre as needed for further documentation. Reviewed preventative protocols and updated unless pt declined.  EKG today

## 2021-07-08 NOTE — Assessment & Plan Note (Signed)
Released from rad onc care. Now only seeing ENT regularly.

## 2021-07-08 NOTE — Progress Notes (Addendum)
Patient ID: William Johns., male    DOB: [birth year], 66 y.o.   MRN: [id number]  This visit was conducted in person.  BP 132/78   Pulse 87   Temp 97.8 F (36.6 C) (Temporal)   Ht 5' 9.5" (1.765 m)   Wt 228 lb 2 oz (103.5 kg)   SpO2 95%   BMI 33.21 kg/m    CC: welcome to medicare Subjective:   HPI: William Johns. is a 66 y.o. male presenting on 07/08/2021 for Welcome to Medicare Exam   Hearing Screening   '500Hz'$  '1000Hz'$  '2000Hz'$  '4000Hz'$   Right ear '20 20 20 '$ 40  Left ear '20 20 20 '$ 40   Vision Screening   Right eye Left eye Both eyes  Without correction     With correction '20/25 20/25 20/25 '$    [place] Office Visit from 07/08/2021 in [place] at [facility]  PHQ-2 Total Score 0          07/08/2021   11:25 AM  [place] in the past year? 0    Brother died last year from complications from neck fracture.   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 Released from rad onc care ([place]). Sees ENT [name]) yearly. Constant ringing in bilateral ears since cancer treatment (chemo).    GERD controlled with daily low dose PPI - breakthrough symptoms if QOD dosing. he's doing better on 20/'10mg'$  alternating daily dose.    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   H/o ureteral stones with atrophic L kidney, saw Dr [facility] recently s/p shockwave lithotripsy 01/2021.    Preventative: Colon cancer screening - 11/2008 2 hyperplastic polyps rec rpt 10 yrs [name]). Due for repeat - referred last year. # provided to call and schedule appt.  Prostate cancer screening - No prostate cancer hx. Nocturia x1-2.  Lung cancer screening - ~30 PY hx. Quit 2009. fmhx lung cancer. Getting CT lungs yearly, last 05/2021  Flu shot yearly  [place] 04/2019, 05/2019, no booster Pnemovax 2014, Prevnar20 - today Tdap 2013 Shingrix - 09/2019, 02/2020 Advanced directive - does not have set up.  Packet provided today. Health proxy would be [name]. Full code. Doesn't want prolonged life support if terminal condition.  Seat belt use discussed.  Sunscreen use discussed. No changing moles on skin.  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 Interim medical history since our last visit reviewed. Allergies and medications reviewed and updated. Outpatient Medications Prior to Visit  Medication Sig Dispense Refill   aspirin 81 MG tablet Take 81 mg by mouth daily.     atorvastatin (LIPITOR) 40 MG tablet Take 1 tablet (40 mg total) by mouth daily. 90 tablet 3   ipratropium (ATROVENT) 0.06 % nasal spray Place into both nostrils.     lisinopril (ZESTRIL) 10 MG tablet Take 1 tablet (10 mg total) by mouth daily. [place]  tablet 3   Multiple Vitamin (MULTIVITAMIN) tablet Take 1 tablet by mouth daily.     OMEGA-3 FATTY ACIDS PO Take 1 capsule by mouth daily.     omeprazole (PRILOSEC OTC) 20 MG tablet Take 20 mg by mouth daily. Alternates 1 tablet one day, then 1/2 tablet next day     sildenafil (VIAGRA) 100 MG tablet Take 100 mg by mouth daily as needed for erectile dysfunction.     tamsulosin (FLOMAX) 0.4 MG CAPS capsule Take 1 capsule (0.4 mg total) by mouth daily after supper. 14 capsule 0   No facility-administered medications prior to visit.     Per HPI unless specifically indicated in ROS section below Review of Systems  Objective:  BP 132/78   Pulse 87   Temp 97.8 F (36.6 C) (Temporal)   Ht 5' 9.5" (1.765 m)    Wt 228 lb 2 oz (103.5 kg)   SpO2 95%   BMI 33.21 kg/m   Wt Readings from Last 3 Encounters:  07/08/21 228 lb 2 oz (103.5 kg)  04/07/21 223 lb (101.2 kg)  01/16/21 224 lb (101.6 kg)      Physical Exam Vitals and nursing note reviewed.  Constitutional:      General: He is not in acute distress.    Appearance: Normal appearance. He is well-developed. He is not ill-appearing.  HENT:     Head: Normocephalic and atraumatic.     Right Ear: Hearing, tympanic membrane, ear canal and external ear normal.     Left Ear: Hearing, tympanic membrane, ear canal and external ear normal.  Eyes:     General: No scleral icterus.    Extraocular Movements: Extraocular movements intact.     Conjunctiva/sclera: Conjunctivae normal.     Pupils: Pupils are equal, round, and reactive to light.  Neck:     Thyroid: No thyroid mass or thyromegaly.     Vascular: No carotid bruit.  Cardiovascular:     Rate and Rhythm: Normal rate and regular rhythm.     Pulses: Normal pulses.          Radial pulses are 2+ on the right side and 2+ on the left side.     Heart sounds: Normal heart sounds. No murmur heard. Pulmonary:     Effort: Pulmonary effort is normal. No respiratory distress.     Breath sounds: Normal breath sounds. No wheezing, rhonchi or rales.  Abdominal:     General: Bowel sounds are normal. There is no distension.     Palpations: Abdomen is soft. There is no mass.     Tenderness: There is no abdominal tenderness. There is no guarding or rebound.     Hernia: No hernia is present.  Musculoskeletal:        General: Normal range of motion.     Cervical back: Normal range of motion and neck supple.     Right lower leg: No edema.     Left lower leg: No edema.  Lymphadenopathy:     Cervical: No cervical adenopathy.  Skin:    General: Skin is warm and dry.     Findings: No rash.  Neurological:     General: No focal deficit present.     Mental Status: He is alert and oriented to person, place, and  time.     Comments:  Recall 2/3, 3/3 with cue Calculation 5/5 DLROW  Psychiatric:        Mood and Affect: Mood normal.  Behavior: Behavior normal.        Thought Content: Thought content normal.        Judgment: Judgment normal.      Results for orders placed or performed in visit on 07/01/21  PSA  Result Value Ref Range   PSA 2.46 0.10 - 4.00 ng/mL  CBC with Differential/Platelet  Result Value Ref Range   WBC 6.7 4.0 - 10.5 K/uL   RBC 4.94 4.22 - 5.81 Mil/uL   Hemoglobin 15.3 13.0 - 17.0 g/dL   HCT 44.9 39.0 - 52.0 %   MCV 91.0 78.0 - 100.0 fl   MCHC 34.1 30.0 - 36.0 g/dL   RDW 14.1 11.5 - 15.5 %   Platelets 168.0 150.0 - 400.0 K/uL   Neutrophils Relative % 75.0 43.0 - 77.0 %   Lymphocytes Relative 13.9 12.0 - 46.0 %   Monocytes Relative 6.8 3.0 - 12.0 %   Eosinophils Relative 3.0 0.0 - 5.0 %   Basophils Relative 1.3 0.0 - 3.0 %   Neutro Abs 5.1 1.4 - 7.7 K/uL   Lymphs Abs 0.9 0.7 - 4.0 K/uL   Monocytes Absolute 0.5 0.1 - 1.0 K/uL   Eosinophils Absolute 0.2 0.0 - 0.7 K/uL   Basophils Absolute 0.1 0.0 - 0.1 K/uL  Hemoglobin A1c  Result Value Ref Range   Hgb A1c MFr Bld 6.9 (H) 4.6 - 6.5 %  Lipid panel  Result Value Ref Range   Cholesterol 143 0 - 200 mg/dL   Triglycerides 288.0 (H) 0.0 - 149.0 mg/dL   HDL 28.00 (L) >39.00 mg/dL   VLDL 57.6 (H) 0.0 - 40.0 mg/dL   Total CHOL/HDL Ratio 5    NonHDL 115.04   Comprehensive metabolic panel  Result Value Ref Range   Sodium 142 135 - 145 mEq/L   Potassium 4.1 3.5 - 5.1 mEq/L   Chloride 105 96 - 112 mEq/L   CO2 29 19 - 32 mEq/L   Glucose, Bld 124 (H) 70 - 99 mg/dL   BUN 13 6 - 23 mg/dL   Creatinine, Ser 1.22 0.40 - 1.50 mg/dL   Total Bilirubin 0.7 0.2 - 1.2 mg/dL   Alkaline Phosphatase 77 39 - 117 U/L   AST 19 0 - 37 U/L   ALT 23 0 - 53 U/L   Total Protein 6.4 6.0 - 8.3 g/dL   Albumin 4.1 3.5 - 5.2 g/dL   GFR 62.08 >60.00 mL/min   Calcium 9.9 8.4 - 10.5 mg/dL  LDL cholesterol, direct  Result Value Ref Range    Direct LDL 74.0 mg/dL   EKG - NSR rate 80s, normal axis, intervals, no hypertrophy or acute ST/T changes. Good R wave progression.   Assessment & Plan:   Problem List Items Addressed This Visit     Advanced directives, counseling/discussion (Chronic)    Advanced directive - does not have set up. Packet provided today. Health proxy would be [name]. Full code. Doesn't want prolonged life support if terminal condition.       Welcome to Medicare preventive visit - Primary (Chronic)    I have personally reviewed the Medicare Annual Wellness questionnaire and have noted 1. The patient's medical and social history 2. Their use of alcohol, tobacco or illicit drugs 3. Their current medications and supplements 4. The patient's functional ability including ADL's, fall risks, home safety risks and hearing or visual impairment. Cognitive function has been assessed and addressed as indicated.  5. Diet and physical activity 6. Evidence for depression or  mood disorders The patients weight, height, BMI have been recorded in the chart. I have made referrals, counseling and provided education to the patient based on review of the above and I have provided the pt with a written personalized care plan for preventive services. Provider list updated.. See scanned questionairre as needed for further documentation. Reviewed preventative protocols and updated unless pt declined.  EKG today       Relevant Orders   EKG 12-Lead (Completed)   HTN (hypertension)    Chronic, stable on low dose lisinopril.        Dyslipidemia    Chronic, stable on atorvastatin '40mg'$  and low dose fish oil daily. Previously on fenofibrate. Reviewed importance of glycemic control to improve triglyceride levels. See below. The 10-year ASCVD risk score (Arnett DK, et al., 2019) is: 30.4%   Values used to calculate the score:     Age: 70 years     Sex: Male     Is Non-Hispanic African American: No     Diabetic: Yes      Tobacco smoker: No     Systolic Blood Pressure: 025 mmHg     Is BP treated: Yes     HDL Cholesterol: 28 mg/dL     Total Cholesterol: 143 mg/dL        GERD (gastroesophageal reflux disease)    Doing well on omeprazole 10/'20mg'$  alternating dose       Obesity, Class I, BMI 30-34.9    Encouraged healthy diet and lifestyle choices to affect sustainable weight loss.        Type 2 diabetes mellitus with other specified complication (HCC)    E5I back in diabetes range, albeit controlled.  Encouraged working towards low sugar low carb diabetic diet and starting metformin '500mg'$  daily. Declines return to nutritionist.  RTC 3 mo DM f/u visit.        Relevant Medications   metFORMIN (GLUCOPHAGE) 500 MG tablet   Abdominal aortic atherosclerosis (HCC)    Continue aspirin, statin.        Aortic dilatation ([place])    Rpt [place] will be due 09/2022.        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 Now only seeing ENT regularly.        Carotid stenosis, asymptomatic, bilateral    Latest carotid US with mild stenosis, will just monitor with clinical exam.        Other Visit Diagnoses     Special screening for malignant neoplasms, colon       Relevant Orders   Cologuard   Need for vaccination against Streptococcus pneumoniae       Relevant Orders   Pneumococcal conjugate vaccine 20-valent (Completed)        Meds ordered this encounter  Medications   metFORMIN (GLUCOPHAGE) 500 MG tablet    Sig: Take 1 tablet (500 mg total) by mouth daily with breakfast.    Dispense:  90 tablet    Refill:  3    Orders Placed This Encounter  Procedures   Pneumococcal conjugate vaccine 20-valent   Cologuard   EKG 12-Lead    Patient instructions: We will sign you up for cologuard.  [id number] today.  Schedule eye exam.  Work on regular exercise, limit sugar/carbs in diet given new diabetes diagnosis.  Start metformin '500mg'$  once daily with breakfast.   Work on advanced directives - packet provided today.  Return in 3 months for diabetes follow up.  Follow up plan: Return in about 1 year (around 07/09/2022) for annual exam, prior fasting for blood work, medicare wellness visit.  Ria Bush, MD

## 2021-07-08 NOTE — Assessment & Plan Note (Signed)
Doing well on omeprazole 10/'20mg'$  alternating dose

## 2021-07-08 NOTE — Assessment & Plan Note (Signed)
Advanced directive - does not have set up. Packet provided today. Health proxy would be [name]. Full code. Doesn't want prolonged life support if terminal condition.

## 2021-07-08 NOTE — Assessment & Plan Note (Signed)
Chronic, stable on low dose lisinopril.

## 2021-07-08 NOTE — Assessment & Plan Note (Signed)
Latest carotid US with mild stenosis, will just monitor with clinical exam.

## 2021-07-08 NOTE — Assessment & Plan Note (Addendum)
A1c back in diabetes range, albeit controlled.  Encouraged working towards low sugar low carb diabetic diet and starting metformin '500mg'$  daily. Declines return to nutritionist.  RTC 3 mo DM f/u visit.

## 2021-07-08 NOTE — Assessment & Plan Note (Signed)
Continue aspirin, statin.  

## 2021-07-08 NOTE — Assessment & Plan Note (Signed)
Rpt [place] will be due 09/2022.

## 2021-07-08 NOTE — Assessment & Plan Note (Signed)
Encouraged healthy diet and lifestyle choices to affect sustainable weight loss.  ?

## 2021-07-08 NOTE — Assessment & Plan Note (Signed)
Chronic, stable on atorvastatin '40mg'$  and low dose fish oil daily. Previously on fenofibrate. Reviewed importance of glycemic control to improve triglyceride levels. See below. The 10-year ASCVD risk score (Arnett DK, et al., 2019) is: 30.4%   Values used to calculate the score:     Age: 66 years     Sex: Male     Is Non-Hispanic African American: No     Diabetic: Yes     Tobacco smoker: No     Systolic Blood Pressure: 474 mmHg     Is BP treated: Yes     HDL Cholesterol: 28 mg/dL     Total Cholesterol: 143 mg/dL

## 2021-07-08 NOTE — Patient Instructions (Addendum)
We will sign you up for cologuard.  [id number] today.  Schedule eye exam.  Work on regular exercise, limit sugar/carbs in diet given new diabetes diagnosis.  Start metformin '500mg'$  once daily with breakfast.  Work on advanced directives - packet provided today.  Return in 3 months for diabetes follow up.   Health Maintenance After Age 66 After age 61, you are at a higher risk for certain long-term diseases and infections as well as injuries from falls. Falls are a major cause of broken bones and head injuries in people who are older than age 66. Getting regular preventive care can help to keep you healthy and well. Preventive care includes getting regular testing and making lifestyle changes as recommended by your health care provider. Talk with your health care provider about: Which screenings and tests you should have. A screening is a test that checks for a disease when you have no symptoms. A diet and exercise plan that is right for you. What should I know about screenings and tests to prevent falls? Screening and testing are the best ways to find a health problem early. Early diagnosis and treatment give you the best chance of managing medical conditions that are common after age 61. Certain conditions and lifestyle choices may make you more likely to have a fall. Your health care provider may recommend: Regular vision checks. Poor vision and conditions such as cataracts can make you more likely to have a fall. If you wear glasses, make sure to get your prescription updated if your vision changes. Medicine review. Work with your health care provider to regularly review all of the medicines you are taking, including over-the-counter medicines. Ask your health care provider about any side effects that may make you more likely to have a fall. Tell your health care provider if any medicines that you take make you feel dizzy or sleepy. Strength and balance checks. Your health care provider may  recommend certain tests to check your strength and balance while standing, walking, or changing positions. Foot health exam. Foot pain and numbness, as well as not wearing proper footwear, can make you more likely to have a fall. Screenings, including: Osteoporosis screening. Osteoporosis is a condition that causes the bones to get weaker and break more easily. Blood pressure screening. Blood pressure changes and medicines to control blood pressure can make you feel dizzy. Depression screening. You may be more likely to have a fall if you have a fear of falling, feel depressed, or feel unable to do activities that you used to do. Alcohol use screening. Using too much alcohol can affect your balance and may make you more likely to have a fall. Follow these instructions at home: Lifestyle Do not drink alcohol if: Your health care provider tells you not to drink. If you drink alcohol: Limit how much you have to: 0-1 drink a day for women. 0-2 drinks a day for men. Know how much alcohol is in your drink. In the [place] one drink equals one 12 oz bottle of beer (355 mL), one 5 oz glass of wine (148 mL), or one 1 oz glass of hard liquor (44 mL). Do not use any products that contain nicotine or tobacco. These products include cigarettes, chewing tobacco, and vaping devices, such as e-cigarettes. If you need help quitting, ask your health care provider. Activity  Follow a regular exercise program to stay fit. This will help you maintain your balance. Ask your health care provider what types of exercise are appropriate  for you. If you need a cane or walker, use it as recommended by your health care provider. Wear supportive shoes that have nonskid soles. Safety  Remove any tripping hazards, such as rugs, cords, and clutter. Install safety equipment such as grab bars in bathrooms and safety rails on stairs. Keep rooms and walkways well-lit. General instructions Talk with your health care provider  about your risks for falling. Tell your health care provider if: You fall. Be sure to tell your health care provider about all falls, even ones that seem minor. You feel dizzy, tiredness (fatigue), or off-balance. Take over-the-counter and prescription medicines only as told by your health care provider. These include supplements. Eat a healthy diet and maintain a healthy weight. A healthy diet includes low-fat dairy products, low-fat (lean) meats, and fiber from whole grains, beans, and lots of fruits and vegetables. Stay current with your vaccines. Schedule regular health, dental, and eye exams. Summary Having a healthy lifestyle and getting preventive care can help to protect your health and wellness after age 27. Screening and testing are the best way to find a health problem early and help you avoid having a fall. Early diagnosis and treatment give you the best chance for managing medical conditions that are more common for people who are older than age 65. Falls are a major cause of broken bones and head injuries in people who are older than age 76. Take precautions to prevent a fall at home. Work with your health care provider to learn what changes you can make to improve your health and wellness and to prevent falls. This information is not intended to replace advice given to you by your health care provider. Make sure you discuss any questions you have with your health care provider. Document Revised: 06/17/2020 Document Reviewed: 06/17/2020 Elsevier Patient Education  [employer].

## 2021-07-15 DIAGNOSIS — Z1211 Encounter for screening for malignant neoplasm of colon: Secondary | ICD-10-CM | POA: Diagnosis not present

## 2021-07-23 ENCOUNTER — Telehealth: Payer: Self-pay | Admitting: Family Medicine

## 2021-07-23 ENCOUNTER — Other Ambulatory Visit: Payer: Self-pay | Admitting: Family Medicine

## 2021-07-23 ENCOUNTER — Other Ambulatory Visit: Payer: Self-pay

## 2021-07-23 DIAGNOSIS — Z1211 Encounter for screening for malignant neoplasm of colon: Secondary | ICD-10-CM

## 2021-07-23 DIAGNOSIS — R195 Other fecal abnormalities: Secondary | ICD-10-CM

## 2021-07-23 LAB — COLOGUARD: COLOGUARD: POSITIVE — AB

## 2021-07-23 MED ORDER — PEG 3350-KCL-NA BICARB-NACL 420 G PO SOLR: 4000.0000 mL | Freq: Once | ORAL | 0 refills | Status: AC

## 2021-07-23 NOTE — Telephone Encounter (Signed)
William Johns (Spouse) about a code for his colonoscopy, they need Dr. Darnell Level to change the code to a collateral screening code instead. They do have a crunch timeframe for this. Please advise and return a call back when possible, thanks.  Callback Number: [phone] (Primary) [phone] (Secondary #) if cannot reach the primary #.

## 2021-07-23 NOTE — Progress Notes (Signed)
Gastroenterology Pre-Procedure Review  Request Date: 07/28/2021 Requesting Physician: Dr. Marius Ditch Positive cologuard PATIENT REVIEW QUESTIONS: The patient responded to the following health history questions as indicated:    1. Are you having any GI issues? no 2. Do you have a personal history of Polyps? yes (last colonoscopy) 3. Do you have a family history of Colon Cancer or Polyps? no 4. Diabetes Mellitus? no 5. Joint replacements in the past 12 months?no 6. Major health problems in the past 3 months?no 7. Any artificial heart valves, MVP, or defibrillator?no    MEDICATIONS & ALLERGIES:    Patient reports the following regarding taking any anticoagulation/antiplatelet therapy:   Plavix, Coumadin, Eliquis, Xarelto, Lovenox, Pradaxa, Brilinta, or Effient? no Aspirin? yes (81 mg)  Patient confirms/reports the following medications:  Current Outpatient Medications  Medication Sig Dispense Refill   aspirin 81 MG tablet Take 81 mg by mouth daily.     atorvastatin (LIPITOR) 40 MG tablet Take 1 tablet (40 mg total) by mouth daily. 90 tablet 3   ipratropium (ATROVENT) 0.06 % nasal spray Place into both nostrils.     lisinopril (ZESTRIL) 10 MG tablet Take 1 tablet (10 mg total) by mouth daily. 90 tablet 3   metFORMIN (GLUCOPHAGE) 500 MG tablet Take 1 tablet (500 mg total) by mouth daily with breakfast. 90 tablet 3   Multiple Vitamin (MULTIVITAMIN) tablet Take 1 tablet by mouth daily.     OMEGA-3 FATTY ACIDS PO Take 1 capsule by mouth daily.     omeprazole (PRILOSEC OTC) 20 MG tablet Take 20 mg by mouth daily. Alternates 1 tablet one day, then 1/2 tablet next day     sildenafil (VIAGRA) 100 MG tablet Take 100 mg by mouth daily as needed for erectile dysfunction.     tamsulosin (FLOMAX) 0.4 MG CAPS capsule Take 1 capsule (0.4 mg total) by mouth daily after supper. 14 capsule 0   No current facility-administered medications for this visit.    Patient confirms/reports the following allergies:   Allergies  Allergen Reactions   Oxycodone Nausea And Vomiting    No orders of the defined types were placed in this encounter.   AUTHORIZATION INFORMATION Primary Insurance: 1D#: Group #:  Secondary Insurance: 1D#: Group #:  SCHEDULE INFORMATION: Date: 07/28/2021 Time: Location: [facility]

## 2021-07-23 NOTE — Telephone Encounter (Signed)
I've added screening colonoscopy code but do need to keep positive Cologuard diagnosis because that's why we need to do colonoscopy.  I'm aware insurance may not cover this as well as a plain screening colonoscopy even though they're exactly the same procedure.

## 2021-07-24 NOTE — Telephone Encounter (Signed)
Spoke with pt/pt's wife, [name], relaying Dr. Synthia Innocent message.  Verbalizes understanding and expresses thanks.

## 2021-07-28 ENCOUNTER — Encounter: Payer: Self-pay | Admitting: Gastroenterology

## 2021-07-28 ENCOUNTER — Ambulatory Visit: Payer: Medicare HMO | Admitting: Certified Registered Nurse Anesthetist

## 2021-07-28 ENCOUNTER — Ambulatory Visit
Admission: RE | Admit: 2021-07-28 | Discharge: 2021-07-28 | Disposition: A | Discharge status: Home / Self Care | Payer: Medicare HMO | Attending: Gastroenterology | Admitting: Gastroenterology

## 2021-07-28 ENCOUNTER — Encounter
Admission: RE | Disposition: A | Discharge status: Home / Self Care | Payer: Self-pay | Source: Home / Self Care | Attending: Gastroenterology

## 2021-07-28 DIAGNOSIS — Z9049 Acquired absence of other specified parts of digestive tract: Secondary | ICD-10-CM | POA: Insufficient documentation

## 2021-07-28 DIAGNOSIS — E785 Hyperlipidemia, unspecified: Secondary | ICD-10-CM | POA: Insufficient documentation

## 2021-07-28 DIAGNOSIS — Z1211 Encounter for screening for malignant neoplasm of colon: Secondary | ICD-10-CM | POA: Diagnosis not present

## 2021-07-28 DIAGNOSIS — Z85818 Personal history of malignant neoplasm of other sites of lip, oral cavity, and pharynx: Secondary | ICD-10-CM | POA: Insufficient documentation

## 2021-07-28 DIAGNOSIS — Z7984 Long term (current) use of oral hypoglycemic drugs: Secondary | ICD-10-CM | POA: Insufficient documentation

## 2021-07-28 DIAGNOSIS — K649 Unspecified hemorrhoids: Secondary | ICD-10-CM | POA: Diagnosis not present

## 2021-07-28 DIAGNOSIS — K635 Polyp of colon: Secondary | ICD-10-CM | POA: Diagnosis not present

## 2021-07-28 DIAGNOSIS — Z87442 Personal history of urinary calculi: Secondary | ICD-10-CM | POA: Diagnosis not present

## 2021-07-28 DIAGNOSIS — K644 Residual hemorrhoidal skin tags: Secondary | ICD-10-CM | POA: Insufficient documentation

## 2021-07-28 DIAGNOSIS — E119 Type 2 diabetes mellitus without complications: Secondary | ICD-10-CM | POA: Insufficient documentation

## 2021-07-28 DIAGNOSIS — K621 Rectal polyp: Secondary | ICD-10-CM | POA: Insufficient documentation

## 2021-07-28 DIAGNOSIS — E669 Obesity, unspecified: Secondary | ICD-10-CM | POA: Diagnosis not present

## 2021-07-28 DIAGNOSIS — Z6832 Body mass index (BMI) 32.0-32.9, adult: Secondary | ICD-10-CM | POA: Insufficient documentation

## 2021-07-28 DIAGNOSIS — R195 Other fecal abnormalities: Secondary | ICD-10-CM | POA: Diagnosis not present

## 2021-07-28 DIAGNOSIS — D125 Benign neoplasm of sigmoid colon: Secondary | ICD-10-CM | POA: Diagnosis not present

## 2021-07-28 DIAGNOSIS — I10 Essential (primary) hypertension: Secondary | ICD-10-CM | POA: Insufficient documentation

## 2021-07-28 DIAGNOSIS — Z87891 Personal history of nicotine dependence: Secondary | ICD-10-CM | POA: Insufficient documentation

## 2021-07-28 DIAGNOSIS — D128 Benign neoplasm of rectum: Secondary | ICD-10-CM | POA: Diagnosis not present

## 2021-07-28 DIAGNOSIS — K219 Gastro-esophageal reflux disease without esophagitis: Secondary | ICD-10-CM | POA: Diagnosis not present

## 2021-07-28 HISTORY — PX: COLONOSCOPY WITH PROPOFOL: SHX5780

## 2021-07-28 SURGERY — COLONOSCOPY WITH PROPOFOL
Anesthesia: General

## 2021-07-28 MED ORDER — GOLYTELY 236 G PO SOLR: 4.0000 L | Freq: Once | ORAL | 0 refills | Status: AC

## 2021-07-28 MED ORDER — PROPOFOL 500 MG/50ML IV EMUL
INTRAVENOUS | Status: DC | PRN
  Administered 2021-07-28: 160 ug/kg/min via INTRAVENOUS

## 2021-07-28 MED ORDER — PROPOFOL 10 MG/ML IV BOLUS
INTRAVENOUS | Status: DC | PRN
  Administered 2021-07-28: 70 mg via INTRAVENOUS
  Administered 2021-07-28: 30 mg via INTRAVENOUS

## 2021-07-28 MED ORDER — SODIUM CHLORIDE 0.9 % IV SOLN: INTRAVENOUS | Status: DC

## 2021-07-28 NOTE — Transfer of Care (Signed)
Immediate Anesthesia Transfer of Care Note  Patient: William Johns.  Procedure(s) Performed: COLONOSCOPY WITH PROPOFOL  Patient Location: PACU  Anesthesia Type:General  Level of Consciousness: awake and alert   Airway & Oxygen Therapy: Patient Spontanous Breathing and Patient connected to nasal cannula oxygen  Post-op Assessment: Report given to RN and Post -op Vital signs reviewed and stable  Post vital signs: Reviewed and stable  Last Vitals:  Vitals Value Taken Time  BP    Temp    Pulse    Resp    SpO2      Last Pain:  Vitals:   07/28/21 0751  TempSrc: Temporal         Complications: No notable events documented.

## 2021-07-28 NOTE — H&P (Signed)
William Darby, MD [address]  [place]  [place], [place] [address]  Main: [phone]  Fax: [phone] Pager: [phone]  Primary Care Physician:  Ria Bush, MD Primary Gastroenterologist:  Dr. Cephas Johns  Pre-Procedure History & Physical: HPI:  William Johns. is a 66 y.o. male is here for an colonoscopy.   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    T2DM (type 2 diabetes mellitus) ([place])    DSME at [facility] 01/2012   Tonsillar mass 05/23/2018    Past Surgical History:  Procedure Laterality Date   CHOLECYSTECTOMY  2007   COLONOSCOPY  11/2008   2 hyperplastic polyps [name])   EXTRACORPOREAL SHOCK WAVE LITHOTRIPSY Left 04/28/2018   Procedure: EXTRACORPOREAL SHOCK WAVE LITHOTRIPSY (ESWL);  Surgeon: Billey Co, MD;  Location: [facility] ORS;  Service: Urology;  Laterality: Left;   EXTRACORPOREAL SHOCK WAVE LITHOTRIPSY Right 01/16/2021   Procedure: EXTRACORPOREAL SHOCK WAVE LITHOTRIPSY (ESWL);  Surgeon: Billey Co, MD;  Location: [facility] ORS;  Service: Urology;  Laterality: Right;   LITHOTRIPSY  2013   x 2    Prior to Admission medications   Medication Sig Start Date End Date Taking? Authorizing Provider  aspirin 81 MG tablet Take 81 mg by mouth daily.   Yes [provider]  atorvastatin (LIPITOR) 40 MG tablet Take 1 tablet (40 mg total) by mouth daily. 10/04/20  Yes Ria Bush, MD  lisinopril (ZESTRIL) 10 MG tablet Take 1 tablet (10 mg total) by mouth daily. 10/04/20  Yes Ria Bush, MD  metFORMIN (GLUCOPHAGE) 500 MG tablet Take 1 tablet (500 mg total) by mouth daily with breakfast. 07/08/21  Yes Ria Bush, MD  Multiple Vitamin (MULTIVITAMIN) tablet Take 1  tablet by mouth daily.   Yes [provider]  OMEGA-3 FATTY ACIDS PO Take 1 capsule by mouth daily.   Yes [provider]  omeprazole (PRILOSEC OTC) 20 MG tablet Take 20 mg by mouth daily. Alternates 1 tablet one day, then 1/2 tablet next day   Yes [provider]  ipratropium (ATROVENT) 0.06 % nasal spray Place into both nostrils. 01/08/20   [provider]  sildenafil (VIAGRA) 100 MG tablet Take 100 mg by mouth daily as needed for erectile dysfunction.    [provider]  tamsulosin (FLOMAX) 0.4 MG CAPS capsule Take 1 capsule (0.4 mg total) by mouth daily after supper. Patient not taking: Reported on 07/28/2021 01/16/21   Billey Co, MD    Allergies as of 07/23/2021 - Review Complete 07/23/2021  Allergen Reaction Noted   Oxycodone Nausea And Vomiting 11/25/2011    Family History  Problem Relation Age of Onset   Cancer Father 57       lung, smoker   Aneurysm Mother 73       brain   Coronary artery disease Neg Hx    Stroke Neg Hx    Diabetes Neg Hx     Social History   Socioeconomic History   Marital status: Divorced    Spouse name: Not on file   Number of children: Not on file   Years of education: Not on file   Highest  education level: Not on file  Occupational History   Not on file  Tobacco Use   Smoking status: Former    Packs/day: 1.00    Years: 36.00    Total pack years: 36.00    Types: Cigarettes    Quit date: 02/10/2007    Years since quitting: 14.4   Smokeless tobacco: Never  Vaping Use   Vaping Use: Never used  Substance and Sexual Activity   Alcohol use: Not Currently    Comment: 5 drinks/week    Drug use: Yes    Types: Marijuana    Comment: rare MJ   Sexual activity: Yes    Birth control/protection: None  Other Topics Concern   Not on file  Social History Narrative   "William Johns"    Lives with girlfriend, and her son, 2 pets   Occupation: self employed, 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   Edu: HS    Activity: walks 1 mi 5 x/wk   Diet: good water, fruits/vegetables daily, changed to diet soda   Social Determinants of Radio broadcast assistant Strain: Not on file  Food Insecurity: Not on file  Transportation Johns: Not on file  Physical Activity: Not on file  Stress: Not on file  Social Connections: Not on file  Intimate Partner Violence: Not on file    Review of Systems: See HPI, otherwise negative ROS  Physical Exam: BP 133/90   Pulse 79   Temp (!) 96.5 F (35.8 C) (Temporal)   Resp 18   Ht '5\' 11"'$  (1.803 m)   Wt 106.6 kg   SpO2 96%   BMI 32.78 kg/m  General:   Alert,  pleasant and cooperative in NAD Head:  Normocephalic and atraumatic. Neck:  Supple; no masses or thyromegaly. Lungs:  Clear throughout to auscultation.    Heart:  Regular rate and rhythm. Abdomen:  Soft, nontender and nondistended. Normal bowel sounds, without guarding, and without rebound.   Neurologic:  Alert and  oriented x4;  grossly normal neurologically.  Impression/Plan: William Johns. is here for an colonoscopy to be performed for cologuard positive  Risks, benefits, limitations, and alternatives regarding  colonoscopy have been reviewed with the patient.  Questions have been answered.  All parties agreeable.   William Sear, MD  07/28/2021, 8:13 AM

## 2021-07-28 NOTE — Anesthesia Preprocedure Evaluation (Signed)
Anesthesia Evaluation  Patient identified by MRN, date of birth, ID band Patient awake    Reviewed: Allergy & Precautions, H&P , NPO status , Patient's Chart, lab work & pertinent test results, reviewed documented beta blocker date and time   Airway Mallampati: II   Neck ROM: full    Dental  (+) Poor Dentition   Pulmonary neg pulmonary ROS, Patient abstained from smoking., former smoker,    Pulmonary exam normal        Cardiovascular Exercise Tolerance: Poor hypertension, On Medications negative cardio ROS Normal cardiovascular exam Rhythm:regular Rate:Normal     Neuro/Psych negative neurological ROS  negative psych ROS   GI/Hepatic Neg liver ROS, GERD  Medicated,  Endo/Other  negative endocrine ROSdiabetes  Renal/GU Renal disease  negative genitourinary   Musculoskeletal   Abdominal   Peds  Hematology negative hematology ROS (+)   Anesthesia Other Findings Past Medical History: No date: ED (erectile dysfunction) No date: GERD (gastroesophageal reflux disease) 2013: History of kidney stones     Comment:  s/p lithotripsy No date: HLD (hyperlipidemia) No date: HTN (hypertension) No date: Kidney stone No date: Obesity 05/2018: 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  No date: T2DM (type 2 diabetes mellitus) ([place])     Comment:  DSME at [facility] 01/2012 05/23/2018: Tonsillar mass Past Surgical History: 2007: CHOLECYSTECTOMY 11/2008: COLONOSCOPY     Comment:  2 hyperplastic polyps [name]) 04/28/2018: EXTRACORPOREAL SHOCK WAVE LITHOTRIPSY; Left     Comment:  Procedure: EXTRACORPOREAL SHOCK WAVE LITHOTRIPSY (ESWL);              Surgeon: Billey Co, MD;  Location: [facility] ORS;                Service: Urology;  Laterality: Left; 01/16/2021: EXTRACORPOREAL SHOCK WAVE LITHOTRIPSY; Right     Comment:  Procedure: EXTRACORPOREAL SHOCK WAVE LITHOTRIPSY (ESWL);              Surgeon:  Billey Co, MD;  Location: [facility] ORS;                Service: Urology;  Laterality: Right; 2013: LITHOTRIPSY     Comment:  x 2 BMI    Body Mass Index: 32.78 kg/m     Reproductive/Obstetrics negative OB ROS                             Anesthesia Physical Anesthesia Plan  ASA: 3  Anesthesia Plan: General   Post-op Pain Management:    Induction:   PONV Risk Score and Plan:   Airway Management Planned:   Additional Equipment:   Intra-op Plan:   Post-operative Plan:   Informed Consent: I have reviewed the patients History and Physical, chart, labs and discussed the procedure including the risks, benefits and alternatives for the proposed anesthesia with the patient or authorized representative who has indicated his/her understanding and acceptance.     Dental Advisory Given  Plan Discussed with: CRNA  Anesthesia Plan Comments:         Anesthesia Quick Evaluation

## 2021-07-28 NOTE — Op Note (Signed)
[facility] Gastroenterology Patient Name: William Johns Procedure Date: 07/28/2021 8:17 AM MRN: [id number] Account #: [id number] Date of Birth: 01-21-1956 Admit Type: Outpatient Age: 66 Room: [facility] ENDO ROOM 4 Gender: Male Note Status: Finalized Instrument Name: Jasper Riling [id number] Procedure:             Colonoscopy Indications:           Last colonoscopy 10 years ago, Positive Cologuard test Providers:             Lin Landsman MD, MD Referring MD:          Ria Bush (Referring MD) Medicines:             General Anesthesia Complications:         No immediate complications. Estimated blood loss: None. Procedure:             Pre-Anesthesia Assessment:                        - Prior to the procedure, a History and Physical was                         performed, and patient medications and allergies were                         reviewed. The patient is competent. The risks and                         benefits of the procedure and the sedation options and                         risks were discussed with the patient. All questions                         were answered and informed consent was obtained.                         Patient identification and proposed procedure were                         verified by the physician, the nurse, the                         anesthesiologist, the anesthetist and the technician                         in the pre-procedure area in the procedure room in the                         endoscopy suite. Mental Status Examination: alert and                         oriented. Airway Examination: normal oropharyngeal                         airway and neck mobility. Respiratory Examination:                         clear to auscultation. CV Examination: normal.  Prophylactic Antibiotics: The patient does not require                         prophylactic antibiotics. Prior Anticoagulants: The                          patient has taken no previous anticoagulant or                         antiplatelet agents. ASA Grade Assessment: II - A                         patient with mild systemic disease. After reviewing                         the risks and benefits, the patient was deemed in                         satisfactory condition to undergo the procedure. The                         anesthesia plan was to use general anesthesia.                         Immediately prior to administration of medications,                         the patient was re-assessed for adequacy to receive                         sedatives. The heart rate, respiratory rate, oxygen                         saturations, blood pressure, adequacy of pulmonary                         ventilation, and response to care were monitored                         throughout the procedure. The physical status of the                         patient was re-assessed after the procedure.                        After obtaining informed consent, the colonoscope was                         passed under direct vision. Throughout the procedure,                         the patient's blood pressure, pulse, and oxygen                         saturations were monitored continuously. The                         Colonoscope was introduced through the anus and  advanced to the the cecum, identified by appendiceal                         orifice and ileocecal valve. The colonoscopy was                         unusually difficult due to inadequate bowel prep. The                         patient tolerated the procedure well. The quality of                         the bowel preparation was evaluated using the BBPS                         [facility] Bowel Preparation Scale) with scores of: Right                         Colon = 1 (portion of mucosa seen, but other areas not                         well seen due to staining, residual stool and/or                          opaque liquid), Transverse Colon = 2 (minor amount of                         residual staining, small fragments of stool and/or                         opaque liquid, but mucosa seen well) and Left Colon =                         2 (minor amount of residual staining, small fragments                         of stool and/or opaque liquid, but mucosa seen well).                         The total BBPS score equals 5. Findings:      The perianal and digital rectal examinations were normal. Pertinent       negatives include normal sphincter tone and no palpable rectal lesions.      Three sessile polyps were found in the rectum and sigmoid colon. The       polyps were 5 to 7 mm in size. These polyps were removed with a cold       snare. Resection and retrieval were complete. Estimated blood loss: none.      Copious quantities of semi-liquid stool was found in the entire colon,       precluding visualization.      Non-bleeding external hemorrhoids were found during retroflexion. The       hemorrhoids were moderate. Impression:            - Three 5 to 7 mm polyps in the rectum and in the  sigmoid colon, removed with a cold snare. Resected and                         retrieved.                        - Stool in the entire examined colon.                        - Non-bleeding external hemorrhoids. Recommendation:        - Discharge patient to home (with escort).                        - Clear liquid diet today.                        - Continue present medications.                        - Await pathology results.                        - Repeat colonoscopy tomorrow with repeat prep because                         the bowel preparation was suboptimal if patient is                         agreeable or within 6 months with a 2 day prep. Procedure Code(s):     --- Professional ---                        [phone], Colonoscopy, flexible; with removal of                          tumor(s), polyp(s), or other lesion(s) by snare                         technique Diagnosis Code(s):     --- Professional ---                        K62.1, Rectal polyp                        K63.5, Polyp of colon                        K64.4, Residual hemorrhoidal skin tags                        R19.5, Other fecal abnormalities CPT copyright 2019 American Medical Association. All rights reserved. The codes documented in this report are preliminary and upon coder review may  be revised to meet current compliance requirements. Dr. Ulyess Mort Lin Landsman MD, MD 07/28/2021 8:47:18 AM This report has been signed electronically. Number of Addenda: 0 Note Initiated On: 07/28/2021 8:17 AM Scope Withdrawal Time: 0 hours 11 minutes 58 seconds  Total Procedure Duration: 0 hours 14 minutes 20 seconds  Estimated Blood Loss:  Estimated blood loss: none.      [facility]

## 2021-07-29 ENCOUNTER — Ambulatory Visit
Admission: RE | Admit: 2021-07-29 | Discharge: 2021-07-29 | Disposition: A | Discharge status: Home / Self Care | Payer: Medicare HMO | Attending: Gastroenterology | Admitting: Gastroenterology

## 2021-07-29 ENCOUNTER — Ambulatory Visit: Payer: Medicare HMO | Admitting: Anesthesiology

## 2021-07-29 ENCOUNTER — Telehealth: Payer: Self-pay

## 2021-07-29 ENCOUNTER — Encounter
Admission: RE | Disposition: A | Discharge status: Home / Self Care | Payer: Self-pay | Source: Home / Self Care | Attending: Gastroenterology

## 2021-07-29 ENCOUNTER — Encounter: Payer: Self-pay | Admitting: Gastroenterology

## 2021-07-29 DIAGNOSIS — Z85818 Personal history of malignant neoplasm of other sites of lip, oral cavity, and pharynx: Secondary | ICD-10-CM | POA: Insufficient documentation

## 2021-07-29 DIAGNOSIS — Z86010 Personal history of colonic polyps: Secondary | ICD-10-CM | POA: Insufficient documentation

## 2021-07-29 DIAGNOSIS — J449 Chronic obstructive pulmonary disease, unspecified: Secondary | ICD-10-CM | POA: Insufficient documentation

## 2021-07-29 DIAGNOSIS — K635 Polyp of colon: Secondary | ICD-10-CM | POA: Diagnosis not present

## 2021-07-29 DIAGNOSIS — Z923 Personal history of irradiation: Secondary | ICD-10-CM | POA: Diagnosis not present

## 2021-07-29 DIAGNOSIS — E785 Hyperlipidemia, unspecified: Secondary | ICD-10-CM | POA: Insufficient documentation

## 2021-07-29 DIAGNOSIS — Z87891 Personal history of nicotine dependence: Secondary | ICD-10-CM | POA: Diagnosis not present

## 2021-07-29 DIAGNOSIS — E1169 Type 2 diabetes mellitus with other specified complication: Secondary | ICD-10-CM

## 2021-07-29 DIAGNOSIS — Z9221 Personal history of antineoplastic chemotherapy: Secondary | ICD-10-CM | POA: Diagnosis not present

## 2021-07-29 DIAGNOSIS — I10 Essential (primary) hypertension: Secondary | ICD-10-CM | POA: Diagnosis not present

## 2021-07-29 DIAGNOSIS — Z1211 Encounter for screening for malignant neoplasm of colon: Secondary | ICD-10-CM | POA: Diagnosis not present

## 2021-07-29 DIAGNOSIS — K219 Gastro-esophageal reflux disease without esophagitis: Secondary | ICD-10-CM | POA: Diagnosis not present

## 2021-07-29 DIAGNOSIS — Z7984 Long term (current) use of oral hypoglycemic drugs: Secondary | ICD-10-CM | POA: Insufficient documentation

## 2021-07-29 DIAGNOSIS — K648 Other hemorrhoids: Secondary | ICD-10-CM | POA: Diagnosis not present

## 2021-07-29 DIAGNOSIS — E119 Type 2 diabetes mellitus without complications: Secondary | ICD-10-CM | POA: Insufficient documentation

## 2021-07-29 DIAGNOSIS — D125 Benign neoplasm of sigmoid colon: Secondary | ICD-10-CM | POA: Diagnosis not present

## 2021-07-29 DIAGNOSIS — K649 Unspecified hemorrhoids: Secondary | ICD-10-CM | POA: Diagnosis not present

## 2021-07-29 HISTORY — PX: COLONOSCOPY: SHX5424

## 2021-07-29 LAB — SURGICAL PATHOLOGY

## 2021-07-29 SURGERY — COLONOSCOPY
Anesthesia: General

## 2021-07-29 MED ORDER — PROPOFOL 1000 MG/100ML IV EMUL
INTRAVENOUS | Status: AC
  Filled 2021-07-29: qty 100

## 2021-07-29 MED ORDER — PROPOFOL 10 MG/ML IV BOLUS
INTRAVENOUS | Status: DC | PRN
  Administered 2021-07-29: 80 mg via INTRAVENOUS

## 2021-07-29 MED ORDER — PROPOFOL 500 MG/50ML IV EMUL
INTRAVENOUS | Status: DC | PRN
  Administered 2021-07-29: 175 ug/kg/min via INTRAVENOUS

## 2021-07-29 MED ORDER — SODIUM CHLORIDE 0.9 % IV SOLN: INTRAVENOUS | Status: DC

## 2021-07-29 MED ORDER — LIDOCAINE HCL (PF) 2 % IJ SOLN
INTRAMUSCULAR | Status: AC
  Filled 2021-07-29: qty 5

## 2021-07-29 MED ORDER — LIDOCAINE HCL (CARDIAC) PF 100 MG/5ML IV SOSY
PREFILLED_SYRINGE | INTRAVENOUS | Status: DC | PRN
  Administered 2021-07-29: 50 mg via INTRAVENOUS

## 2021-07-29 NOTE — Transfer of Care (Signed)
Immediate Anesthesia Transfer of Care Note  Patient: William Johns.  Procedure(s) Performed: COLONOSCOPY  Patient Location: PACU  Anesthesia Type:General  Level of Consciousness: sedated  Airway & Oxygen Therapy: Patient Spontanous Breathing  Post-op Assessment: Report given to RN and Post -op Vital signs reviewed and stable  Post vital signs: Reviewed and stable  Last Vitals:  Vitals Value Taken Time  BP 95/43 07/29/21 0818  Temp 36.4 C 07/29/21 0818  Pulse 70 07/29/21 0819  Resp 14 07/29/21 0819  SpO2 96 % 07/29/21 0819  Vitals shown include unvalidated device data.  Last Pain:  Vitals:   07/29/21 0818  TempSrc: Temporal  PainSc: 0-No pain         Complications: No notable events documented.

## 2021-07-29 NOTE — Anesthesia Postprocedure Evaluation (Signed)
Anesthesia Post Note  Patient: William Johns.  Procedure(s) Performed: COLONOSCOPY  Patient location during evaluation: PACU Anesthesia Type: General Level of consciousness: awake and oriented Pain management: satisfactory to patient Vital Signs Assessment: post-procedure vital signs reviewed and stable Respiratory status: spontaneous breathing and respiratory function stable Cardiovascular status: stable Anesthetic complications: no   No notable events documented.   Last Vitals:  Vitals:   07/29/21 0828 07/29/21 0838  BP: 111/74 112/73  Pulse: 66 74  Resp: 14 12  Temp:    SpO2: 100% 100%    Last Pain:  Vitals:   07/29/21 0838  TempSrc:   PainSc: 0-No pain                 VAN STAVEREN,Kahlen Morais

## 2021-07-29 NOTE — H&P (Signed)
Cephas Darby, MD [address]  [place]  [place], [place] [address]  Main: [phone]  Fax: [phone] Pager: [phone]  Primary Care Physician:  Ria Bush, MD Primary Gastroenterologist:  Dr. Cephas Darby  Pre-Procedure History & Physical: HPI:  William Johns. is a 66 y.o. male is here for an colonoscopy.   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    T2DM (type 2 diabetes mellitus) ([place])    DSME at [facility] 01/2012   Tonsillar mass 05/23/2018    Past Surgical History:  Procedure Laterality Date   CHOLECYSTECTOMY  2007   COLONOSCOPY  11/2008   2 hyperplastic polyps [name])   EXTRACORPOREAL SHOCK WAVE LITHOTRIPSY Left 04/28/2018   Procedure: EXTRACORPOREAL SHOCK WAVE LITHOTRIPSY (ESWL);  Surgeon: Billey Co, MD;  Location: [facility] ORS;  Service: Urology;  Laterality: Left;   EXTRACORPOREAL SHOCK WAVE LITHOTRIPSY Right 01/16/2021   Procedure: EXTRACORPOREAL SHOCK WAVE LITHOTRIPSY (ESWL);  Surgeon: Billey Co, MD;  Location: [facility] ORS;  Service: Urology;  Laterality: Right;   LITHOTRIPSY  2013   x 2    Prior to Admission medications   Medication Sig Start Date End Date Taking? Authorizing Provider  aspirin 81 MG tablet Take 81 mg by mouth daily.   Yes [provider]  atorvastatin (LIPITOR) 40 MG tablet Take 1 tablet (40 mg total) by mouth daily. 10/04/20  Yes Ria Bush, MD  lisinopril (ZESTRIL) 10 MG tablet Take 1 tablet (10 mg total) by mouth daily. 10/04/20  Yes Ria Bush, MD  metFORMIN (GLUCOPHAGE) 500 MG tablet Take 1 tablet (500 mg total) by mouth daily with breakfast. 07/08/21  Yes Ria Bush, MD  Multiple Vitamin (MULTIVITAMIN) tablet Take 1  tablet by mouth daily.   Yes [provider]  OMEGA-3 FATTY ACIDS PO Take 1 capsule by mouth daily.   Yes [provider]  omeprazole (PRILOSEC OTC) 20 MG tablet Take 20 mg by mouth daily. Alternates 1 tablet one day, then 1/2 tablet next day   Yes [provider]  ipratropium (ATROVENT) 0.06 % nasal spray Place into both nostrils. 01/08/20   [provider]  sildenafil (VIAGRA) 100 MG tablet Take 100 mg by mouth daily as needed for erectile dysfunction.    [provider]  tamsulosin (FLOMAX) 0.4 MG CAPS capsule Take 1 capsule (0.4 mg total) by mouth daily after supper. Patient not taking: Reported on 07/28/2021 01/16/21   Billey Co, MD    Allergies as of 07/28/2021 - Review Complete 07/28/2021  Allergen Reaction Noted   Oxycodone Nausea And Vomiting 11/25/2011    Family History  Problem Relation Age of Onset   Cancer Father 55       lung, smoker   Aneurysm Mother 54       brain   Coronary artery disease Neg Hx    Stroke Neg Hx    Diabetes Neg Hx     Social History   Socioeconomic History   Marital status: Divorced    Spouse name: Not on file   Number of children: Not on file   Years of education: Not on file   Highest  education level: Not on file  Occupational History   Not on file  Tobacco Use   Smoking status: Former    Packs/day: 1.00    Years: 36.00    Total pack years: 36.00    Types: Cigarettes    Quit date: 02/10/2007    Years since quitting: 14.4   Smokeless tobacco: Never  Vaping Use   Vaping Use: Never used  Substance and Sexual Activity   Alcohol use: Not Currently    Comment: 5 drinks/week    Drug use: Yes    Types: Marijuana    Comment: rare MJ   Sexual activity: Yes    Birth control/protection: None  Other Topics Concern   Not on file  Social History Narrative   "[name]"    Lives with girlfriend, and her son, 2 pets   Occupation: self employed, 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   Edu: HS    Activity: walks 1 mi 5 x/wk   Diet: good water, fruits/vegetables daily, changed to diet soda   Social Determinants of Radio broadcast assistant Strain: Not on file  Food Insecurity: Not on file  Transportation Johns: Not on file  Physical Activity: Not on file  Stress: Not on file  Social Connections: Not on file  Intimate Partner Violence: Not on file    Review of Systems: See HPI, otherwise negative ROS  Physical Exam: BP (!) 134/93   Pulse 74   Temp 98.3 F (36.8 C) (Temporal)   Resp 20   SpO2 98%  General:   Alert,  pleasant and cooperative in NAD Head:  Normocephalic and atraumatic. Neck:  Supple; no masses or thyromegaly. Lungs:  Clear throughout to auscultation.    Heart:  Regular rate and rhythm. Abdomen:  Soft, nontender and nondistended. Normal bowel sounds, without guarding, and without rebound.   Neurologic:  Alert and  oriented x4;  grossly normal neurologically.  Impression/Plan: William Johns. is here for an colonoscopy to be performed for colon cancer screening, inadequate prep  Risks, benefits, limitations, and alternatives regarding  colonoscopy have been reviewed with the patient.  Questions have been answered.  All parties agreeable.   [name], MD  07/29/2021, 7:48 AM

## 2021-07-29 NOTE — Op Note (Signed)
[facility] Patient Name: William Johns Procedure Date: 07/29/2021 7:09 AM MRN: [id number] Account #: [id number] Date of Birth: February 15, 1955 Admit Type: Outpatient Age: 66 Room: [facility] ENDO ROOM 3 Gender: Male Note Status: Finalized Instrument Name: Jasper Riling [id number] Procedure:             Colonoscopy Indications:           Screening for colorectal malignant neoplasm,                         Surveillance: Personal history of colonic polyps with                         unknown histology on last colonoscopy (inadequate                         bowel preparation) less than 3 years ago Providers:             Lin Landsman MD, MD Referring MD:          Ria Bush (Referring MD) Medicines:             General Anesthesia Complications:         No immediate complications. Estimated blood loss: None. Procedure:             Pre-Anesthesia Assessment:                        - Prior to the procedure, a History and Physical was                         performed, and patient medications and allergies were                         reviewed. The patient is competent. The risks and                         benefits of the procedure and the sedation options and                         risks were discussed with the patient. All questions                         were answered and informed consent was obtained.                         Patient identification and proposed procedure were                         verified by the physician, the nurse, the                         anesthesiologist, the anesthetist and the technician                         in the pre-procedure area in the procedure room in the                         endoscopy suite. Mental Status Examination: alert and  oriented. Airway Examination: normal oropharyngeal                         airway and neck mobility. Respiratory Examination:                         clear to  auscultation. CV Examination: normal.                         Prophylactic Antibiotics: The patient does not require                         prophylactic antibiotics. Prior Anticoagulants: The                         patient has taken no previous anticoagulant or                         antiplatelet agents. ASA Grade Assessment: III - A                         patient with severe systemic disease. After reviewing                         the risks and benefits, the patient was deemed in                         satisfactory condition to undergo the procedure. The                         anesthesia plan was to use general anesthesia.                         Immediately prior to administration of medications,                         the patient was re-assessed for adequacy to receive                         sedatives. The heart rate, respiratory rate, oxygen                         saturations, blood pressure, adequacy of pulmonary                         ventilation, and response to care were monitored                         throughout the procedure. The physical status of the                         patient was re-assessed after the procedure.                        After obtaining informed consent, the colonoscope was                         passed under direct vision. Throughout the procedure,  the patient's blood pressure, pulse, and oxygen                         saturations were monitored continuously. The                         Colonoscope was introduced through the anus and                         advanced to the the cecum, identified by appendiceal                         orifice and ileocecal valve. The colonoscopy was                         performed without difficulty. The patient tolerated                         the procedure well. The quality of the bowel                         preparation was evaluated using the BBPS [facility] Bowel                          Preparation Scale) with scores of: Right Colon = 2                         (minor amount of residual staining, small fragments of                         stool and/or opaque liquid, but mucosa seen well),                         Transverse Colon = 2 (minor amount of residual                         staining, small fragments of stool and/or opaque                         liquid, but mucosa seen well) and Left Colon = 2                         (minor amount of residual staining, small fragments of                         stool and/or opaque liquid, but mucosa seen well). The                         total BBPS score equals 6. The quality of the bowel                         preparation was fair. Findings:      The perianal and digital rectal examinations were normal. Pertinent       negatives include normal sphincter tone and no palpable rectal lesions.      Two sessile polyps were found in the sigmoid colon. The polyps were 4 to  5 mm in size. These polyps were removed with a cold snare. Resection and       retrieval were complete. Estimated blood loss: none.      Non-bleeding external and internal hemorrhoids were found during       retroflexion. The hemorrhoids were medium-sized. Impression:            - Preparation of the colon was fair.                        - Two 4 to 5 mm polyps in the sigmoid colon, removed                         with a cold snare. Resected and retrieved.                        - Non-bleeding external and internal hemorrhoids. Recommendation:        - Discharge patient to home (with escort).                        - Resume previous diet today.                        - Continue present medications.                        - Await pathology results.                        - Repeat colonoscopy in 6 months with 2 day prep                         because the bowel preparation was suboptimal. Procedure Code(s):     --- Professional ---                         [phone], Colonoscopy, flexible; with removal of                         tumor(s), polyp(s), or other lesion(s) by snare                         technique Diagnosis Code(s):     --- Professional ---                        Z12.11, Encounter for screening for malignant neoplasm                         of colon                        Z86.010, Personal history of colonic polyps                        K64.8, Other hemorrhoids                        K63.5, Polyp of colon CPT copyright 2019 American Medical Association. All rights reserved. The codes documented in this report are preliminary and upon coder review may  be revised to meet current compliance requirements. Dr. Ulyess Mort  Lin Landsman MD, MD 07/29/2021 8:14:18 AM This report has been signed electronically. Number of Addenda: 0 Note Initiated On: 07/29/2021 7:09 AM Scope Withdrawal Time: 0 hours 13 minutes 23 seconds  Total Procedure Duration: 0 hours 16 minutes 18 seconds  Estimated Blood Loss:  Estimated blood loss: none.      [facility]

## 2021-07-29 NOTE — Anesthesia Procedure Notes (Signed)
Date/Time: 07/29/2021 7:50 AM  Performed by: Johnna Acosta, CRNAPre-anesthesia Checklist: Patient identified, Emergency Drugs available, Suction available, Timeout performed and Patient being monitored Patient Re-evaluated:Patient Re-evaluated prior to induction Oxygen Delivery Method: Nasal cannula Preoxygenation: Pre-oxygenation with 100% oxygen Induction Type: IV induction

## 2021-07-29 NOTE — Telephone Encounter (Signed)
Returned patients call left voicemail for a call back

## 2021-07-29 NOTE — Anesthesia Preprocedure Evaluation (Signed)
Anesthesia Evaluation  Patient identified by MRN, date of birth, ID band Patient awake    Reviewed: Allergy & Precautions, NPO status , Patient's Chart, lab work & pertinent test results  Airway Mallampati: II  TM Distance: >3 FB Neck ROM: Full    Dental  (+) Poor Dentition, Chipped, Dental Advisory Given   Pulmonary neg pulmonary ROS, COPD, Patient abstained from smoking., former smoker,    Pulmonary exam normal  + decreased breath sounds      Cardiovascular Exercise Tolerance: Good hypertension, Pt. on medications negative cardio ROS Normal cardiovascular exam Rhythm:Regular     Neuro/Psych negative neurological ROS  negative psych ROS   GI/Hepatic negative GI ROS, Neg liver ROS, GERD  ,  Endo/Other  negative endocrine ROSdiabetes, Well Controlled, Type 2, Oral Hypoglycemic AgentsMorbid obesity  Renal/GU negative Renal ROS  negative genitourinary   Musculoskeletal   Abdominal (+) + obese,   Peds negative pediatric ROS (+)  Hematology negative hematology ROS (+)   Anesthesia Other Findings Past Medical History: No date: ED (erectile dysfunction) No date: GERD (gastroesophageal reflux disease) 2013: History of kidney stones     Comment:  s/p lithotripsy No date: HLD (hyperlipidemia) No date: HTN (hypertension) No date: Kidney stone No date: Obesity 05/2018: 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  No date: T2DM (type 2 diabetes mellitus) ([place])     Comment:  DSME at [facility] 01/2012 05/23/2018: Tonsillar mass  Past Surgical History: 2007: CHOLECYSTECTOMY 11/2008: COLONOSCOPY     Comment:  2 hyperplastic polyps [name]) 04/28/2018: EXTRACORPOREAL SHOCK WAVE LITHOTRIPSY; Left     Comment:  Procedure: EXTRACORPOREAL SHOCK WAVE LITHOTRIPSY (ESWL);              Surgeon: Billey Co, MD;  Location: [facility] ORS;                Service: Urology;  Laterality: Left; 01/16/2021:  EXTRACORPOREAL SHOCK WAVE LITHOTRIPSY; Right     Comment:  Procedure: EXTRACORPOREAL SHOCK WAVE LITHOTRIPSY (ESWL);              Surgeon: Billey Co, MD;  Location: [facility] ORS;                Service: Urology;  Laterality: Right; 2013: LITHOTRIPSY     Comment:  x 2     Reproductive/Obstetrics negative OB ROS                             Anesthesia Physical Anesthesia Plan  ASA: 3  Anesthesia Plan: General   Post-op Pain Management:    Induction: Intravenous  PONV Risk Score and Plan: Propofol infusion and TIVA  Airway Management Planned: Natural Airway  Additional Equipment:   Intra-op Plan:   Post-operative Plan:   Informed Consent: I have reviewed the patients History and Physical, chart, labs and discussed the procedure including the risks, benefits and alternatives for the proposed anesthesia with the patient or authorized representative who has indicated his/her understanding and acceptance.     Dental Advisory Given  Plan Discussed with: CRNA and Surgeon  Anesthesia Plan Comments:         Anesthesia Quick Evaluation

## 2021-07-30 ENCOUNTER — Encounter: Payer: Self-pay | Admitting: Gastroenterology

## 2021-07-30 LAB — SURGICAL PATHOLOGY

## 2021-07-30 NOTE — Anesthesia Postprocedure Evaluation (Signed)
Anesthesia Post Note  Patient: William Johns.  Procedure(s) Performed: COLONOSCOPY WITH PROPOFOL  Patient location during evaluation: PACU Anesthesia Type: General Level of consciousness: awake and alert Pain management: pain level controlled Vital Signs Assessment: post-procedure vital signs reviewed and stable Respiratory status: spontaneous breathing, nonlabored ventilation, respiratory function stable and patient connected to nasal cannula oxygen Cardiovascular status: blood pressure returned to baseline and stable Postop Assessment: no apparent nausea or vomiting Anesthetic complications: no   No notable events documented.   Last Vitals:  Vitals:   07/28/21 0856 07/28/21 0906  BP: 100/77 110/85  Pulse: 76 73  Resp: 18 19  Temp:    SpO2: 95% 97%    Last Pain:  Vitals:   07/29/21 0742  TempSrc:   PainSc: 0-No pain                 Molli Barrows

## 2021-08-01 ENCOUNTER — Encounter: Payer: Self-pay | Admitting: Family Medicine

## 2021-09-23 DIAGNOSIS — Z85818 Personal history of malignant neoplasm of other sites of lip, oral cavity, and pharynx: Secondary | ICD-10-CM | POA: Diagnosis not present

## 2021-09-23 DIAGNOSIS — J300 Vasomotor rhinitis: Secondary | ICD-10-CM | POA: Diagnosis not present

## 2021-09-25 ENCOUNTER — Other Ambulatory Visit: Payer: Self-pay

## 2021-09-25 NOTE — Patient Outreach (Signed)
[facility]) Care Management  09/25/2021  William Johns Feb 26, 1955 [id number]   Telephone Screen    Outreach call to patient to introduce [facility] services and assess care needs as part of benefit of PCP office and insurance plan. Spoke with patient. Patient declines any needs or need for services at this time.    Main healthcare issue/concern today: None reported. Patient states things going well. No issues with meds. He voices he has new Lexus car and no issues with transportation.    Health Maintenance/Care Gaps Addressed & Education Provided:   -Last AWV: 07/08/21-per EMR an [place], RN,BSN,CCM [place] Management Telephonic Care Management Coordinator Direct Phone: [phone] Toll Free: [phone] Fax: [phone]

## 2021-10-08 ENCOUNTER — Encounter: Payer: Self-pay | Admitting: Family Medicine

## 2021-10-08 ENCOUNTER — Ambulatory Visit (INDEPENDENT_AMBULATORY_CARE_PROVIDER_SITE_OTHER): Payer: Medicare HMO | Admitting: Family Medicine

## 2021-10-08 VITALS — BP 120/82 | HR 75 | Temp 98.0°F | Ht 71.0 in | Wt 228.1 lb

## 2021-10-08 DIAGNOSIS — R195 Other fecal abnormalities: Secondary | ICD-10-CM | POA: Diagnosis not present

## 2021-10-08 DIAGNOSIS — E785 Hyperlipidemia, unspecified: Secondary | ICD-10-CM

## 2021-10-08 DIAGNOSIS — E1169 Type 2 diabetes mellitus with other specified complication: Secondary | ICD-10-CM | POA: Diagnosis not present

## 2021-10-08 LAB — LIPID PANEL
Cholesterol: 182 mg/dL (ref 0–200)
HDL: 36.2 mg/dL — ABNORMAL LOW (ref >39.00)
NonHDL: 146.24
Total CHOL/HDL Ratio: 5
Triglycerides: 221 mg/dL — ABNORMAL HIGH (ref 0.0–149.0)
VLDL: 44.2 mg/dL — ABNORMAL HIGH (ref 0.0–40.0)

## 2021-10-08 LAB — LDL CHOLESTEROL, DIRECT: Direct LDL: 111 mg/dL

## 2021-10-08 LAB — POCT GLYCOSYLATED HEMOGLOBIN (HGB A1C): Hemoglobin A1C: 6.4 % — AB (ref 4.0–5.6)

## 2021-10-08 LAB — BASIC METABOLIC PANEL
BUN: 23 mg/dL (ref 6–23)
CO2: 27 mEq/L (ref 19–32)
Calcium: 9.6 mg/dL (ref 8.4–10.5)
Chloride: 104 mEq/L (ref 96–112)
Creatinine, Ser: 1.27 mg/dL (ref 0.40–1.50)
GFR: 59.04 mL/min — ABNORMAL LOW (ref >60.00)
Glucose, Bld: 129 mg/dL — ABNORMAL HIGH (ref 70–99)
Potassium: 4.3 mEq/L (ref 3.5–5.1)
Sodium: 140 mEq/L (ref 135–145)

## 2021-10-08 MED ORDER — ATORVASTATIN CALCIUM 40 MG PO TABS: 40.0000 mg | ORAL_TABLET | Freq: Every day | ORAL | 3 refills | Status: DC

## 2021-10-08 MED ORDER — LISINOPRIL 10 MG PO TABS
10.0000 mg | ORAL_TABLET | Freq: Every day | ORAL | 3 refills | Status: DC
Start: 2021-10-08 — End: 2022-07-22

## 2021-10-08 NOTE — Patient Instructions (Addendum)
Schedule diabetic eye exam yearly. Have them send [place] report.  Sugars are doing well today - continue metformin.  Return as needed or in 9 months for physical/wellness visit.

## 2021-10-08 NOTE — Progress Notes (Signed)
Patient ID: [name]., male    DOB: Jun 16, 1955, 66 y.o.   MRN: [id number]  This visit was conducted in person.  BP 120/82   Pulse 75   Temp 98 F (36.7 C) (Temporal)   Ht '5\' 11"'$  (1.803 m)   Wt 228 lb 2 oz (103.5 kg)   SpO2 94%   BMI 31.82 kg/m    CC: DM f/u visit  Subjective:   HPI: William Johns. is a 66 y.o. male presenting on 10/08/2021 for Diabetes (Here for 3 mo f/u.)   DM - does not regularly check sugars. Compliant with antihyperglycemic regimen which includes: metformin '500mg'$  daily. Denies low sugars or hypoglycemic symptoms. Denies paresthesias, blurry vision. Last diabetic eye exam 02/2018 - DUE. Glucometer brand: unsure. Last foot exam: 03/2019 - DUE. DSME: remotely 2013. Lab Results  Component Value Date   HGBA1C 6.4 (A) 10/08/2021   Diabetic Foot Exam - Simple   Simple Foot Form Diabetic Foot exam was performed with the following findings: Yes 10/08/2021 11:09 AM  Visual Inspection No deformities, no ulcerations, no other skin breakdown bilaterally: Yes Sensation Testing Intact to touch and monofilament testing bilaterally: Yes Pulse Check Posterior Tibialis and Dorsalis pulse intact bilaterally: Yes Comments    Lab Results  Component Value Date   MICROALBUR 1.7 05/15/2014     Colonoscopy - sub-optimal prep despite 2 separate attempts back to back. 5 polyps removed, all HP.      Relevant past medical, surgical, family and social history reviewed and updated as indicated. Interim medical history since our last visit reviewed. Allergies and medications reviewed and updated. Outpatient Medications Prior to Visit  Medication Sig Dispense Refill   aspirin 81 MG tablet Take 81 mg by mouth daily.     ipratropium (ATROVENT) 0.06 % nasal spray Place into both nostrils.     metFORMIN (GLUCOPHAGE) 500 MG tablet Take 1 tablet (500 mg total) by mouth daily with breakfast. 90 tablet 3   Multiple Vitamin (MULTIVITAMIN) tablet Take 1 tablet by mouth daily.      OMEGA-3 FATTY ACIDS PO Take 1 capsule by mouth daily.     omeprazole (PRILOSEC OTC) 20 MG tablet Take 20 mg by mouth daily. Alternates 1 tablet one day, then 1/2 tablet next day     sildenafil (VIAGRA) 100 MG tablet Take 100 mg by mouth daily as needed for erectile dysfunction.     atorvastatin (LIPITOR) 40 MG tablet Take 1 tablet (40 mg total) by mouth daily. 90 tablet 3   lisinopril (ZESTRIL) 10 MG tablet Take 1 tablet (10 mg total) by mouth daily. 90 tablet 3   tamsulosin (FLOMAX) 0.4 MG CAPS capsule Take 1 capsule (0.4 mg total) by mouth daily after supper. (Patient not taking: Reported on 07/28/2021) 14 capsule 0   No facility-administered medications prior to visit.     Per HPI unless specifically indicated in ROS section below Review of Systems  Objective:  BP 120/82   Pulse 75   Temp 98 F (36.7 C) (Temporal)   Ht '5\' 11"'$  (1.803 m)   Wt 228 lb 2 oz (103.5 kg)   SpO2 94%   BMI 31.82 kg/m   Wt Readings from Last 3 Encounters:  10/08/21 228 lb 2 oz (103.5 kg)  07/28/21 235 lb (106.6 kg)  07/08/21 228 lb 2 oz (103.5 kg)      Physical Exam Vitals and nursing note reviewed.  Constitutional:      Appearance: Normal appearance.  He is not ill-appearing.  Eyes:     Extraocular Movements: Extraocular movements intact.     Conjunctiva/sclera: Conjunctivae normal.     Pupils: Pupils are equal, round, and reactive to light.  Cardiovascular:     Rate and Rhythm: Normal rate and regular rhythm.     Pulses: Normal pulses.     Heart sounds: Normal heart sounds. No murmur heard. Pulmonary:     Effort: Pulmonary effort is normal. No respiratory distress.     Breath sounds: Normal breath sounds. No wheezing, rhonchi or rales.  Musculoskeletal:     Right lower leg: No edema.     Left lower leg: No edema.     Comments: See HPI for foot exam if done  Skin:    General: Skin is warm and dry.     Findings: No rash.  Neurological:     Mental Status: He is alert.  Psychiatric:         Mood and Affect: Mood normal.        Behavior: Behavior normal.       Results for orders placed or performed in visit on 10/08/21  POCT glycosylated hemoglobin (Hb A1C)  Result Value Ref Range   Hemoglobin A1C 6.4 (A) 4.0 - 5.6 %   HbA1c POC (<> result, manual entry)     HbA1c, POC (prediabetic range)     HbA1c, POC (controlled diabetic range)      Assessment & Plan:   Problem List Items Addressed This Visit     Dyslipidemia associated with type 2 diabetes mellitus ([place])    Last triglycerides too high - will update FLP with better sugar control.  Off fibrate for the past 1-2 yrs.  Continues atorvastatin '40mg'$  and low dose fish oil daily.       Relevant Medications   lisinopril (ZESTRIL) 10 MG tablet   atorvastatin (LIPITOR) 40 MG tablet   Other Relevant Orders   Lipid panel   Type 2 diabetes mellitus with other specified complication (HCC) - Primary    Chronic, improved. Continue metformin '500mg'$  daily.  Encouraged he schedule eye exam as due.  Foot exam today.       Relevant Medications   lisinopril (ZESTRIL) 10 MG tablet   atorvastatin (LIPITOR) 40 MG tablet   Other Relevant Orders   POCT glycosylated hemoglobin (Hb A1C) (Completed)   Basic metabolic panel   Lipid panel   Positive colorectal cancer screening using Cologuard test    Reviewed recent suboptimal views on colonoscopy x2. He is hesitant to repeat.         Meds ordered this encounter  Medications   lisinopril (ZESTRIL) 10 MG tablet    Sig: Take 1 tablet (10 mg total) by mouth daily.    Dispense:  90 tablet    Refill:  3   atorvastatin (LIPITOR) 40 MG tablet    Sig: Take 1 tablet (40 mg total) by mouth daily.    Dispense:  90 tablet    Refill:  3   Orders Placed This Encounter  Procedures   Basic metabolic panel   Lipid panel   POCT glycosylated hemoglobin (Hb A1C)     Patient Instructions  Schedule diabetic eye exam yearly. Have them send [place] report.  Sugars are doing well today -  continue metformin.  Return as needed or in 9 months for physical/wellness visit.   Follow up plan: Return in about 9 months (around 07/09/2022) for annual exam, prior fasting for blood work, Information systems manager  wellness visit.  Ria Bush, MD

## 2021-10-08 NOTE — Assessment & Plan Note (Signed)
Chronic, improved. Continue metformin '500mg'$  daily.  Encouraged he schedule eye exam as due.  Foot exam today.

## 2021-10-08 NOTE — Assessment & Plan Note (Signed)
Last triglycerides too high - will update FLP with better sugar control.  Off fibrate for the past 1-2 yrs.  Continues atorvastatin '40mg'$  and low dose fish oil daily.

## 2021-10-08 NOTE — Assessment & Plan Note (Deleted)
Continues aspirin, statin.  

## 2021-10-08 NOTE — Assessment & Plan Note (Signed)
Reviewed recent suboptimal views on colonoscopy x2. He is hesitant to repeat.

## 2021-11-25 DIAGNOSIS — E119 Type 2 diabetes mellitus without complications: Secondary | ICD-10-CM | POA: Diagnosis not present

## 2021-11-25 DIAGNOSIS — H5203 Hypermetropia, bilateral: Secondary | ICD-10-CM | POA: Diagnosis not present

## 2021-11-25 DIAGNOSIS — H524 Presbyopia: Secondary | ICD-10-CM | POA: Diagnosis not present

## 2021-11-25 DIAGNOSIS — H52221 Regular astigmatism, right eye: Secondary | ICD-10-CM | POA: Diagnosis not present

## 2021-11-25 LAB — HM DIABETES EYE EXAM

## 2021-11-28 ENCOUNTER — Encounter: Payer: Self-pay | Admitting: Family Medicine

## 2021-12-08 ENCOUNTER — Encounter (INDEPENDENT_AMBULATORY_CARE_PROVIDER_SITE_OTHER): Payer: Self-pay

## 2021-12-18 ENCOUNTER — Ambulatory Visit (INDEPENDENT_AMBULATORY_CARE_PROVIDER_SITE_OTHER): Payer: Medicare HMO

## 2021-12-18 DIAGNOSIS — Z23 Encounter for immunization: Secondary | ICD-10-CM

## 2022-06-12 ENCOUNTER — Other Ambulatory Visit: Payer: Self-pay | Admitting: Family Medicine

## 2022-06-12 DIAGNOSIS — E1169 Type 2 diabetes mellitus with other specified complication: Secondary | ICD-10-CM

## 2022-07-03 ENCOUNTER — Other Ambulatory Visit: Payer: Medicare HMO

## 2022-07-10 ENCOUNTER — Encounter: Payer: Medicare HMO | Admitting: Family Medicine

## 2022-07-12 ENCOUNTER — Other Ambulatory Visit: Payer: Self-pay | Admitting: Family Medicine

## 2022-07-12 DIAGNOSIS — E1169 Type 2 diabetes mellitus with other specified complication: Secondary | ICD-10-CM

## 2022-07-12 DIAGNOSIS — Z125 Encounter for screening for malignant neoplasm of prostate: Secondary | ICD-10-CM

## 2022-07-15 ENCOUNTER — Other Ambulatory Visit (INDEPENDENT_AMBULATORY_CARE_PROVIDER_SITE_OTHER): Payer: Medicare HMO

## 2022-07-15 DIAGNOSIS — E785 Hyperlipidemia, unspecified: Secondary | ICD-10-CM | POA: Diagnosis not present

## 2022-07-15 DIAGNOSIS — Z125 Encounter for screening for malignant neoplasm of prostate: Secondary | ICD-10-CM | POA: Diagnosis not present

## 2022-07-15 DIAGNOSIS — E1169 Type 2 diabetes mellitus with other specified complication: Secondary | ICD-10-CM

## 2022-07-15 LAB — PSA: PSA: 2.34 ng/mL (ref 0.10–4.00)

## 2022-07-15 LAB — COMPREHENSIVE METABOLIC PANEL
ALT: 27 U/L (ref 0–53)
AST: 20 U/L (ref 0–37)
Albumin: 4.1 g/dL (ref 3.5–5.2)
Alkaline Phosphatase: 69 U/L (ref 39–117)
BUN: 27 mg/dL — ABNORMAL HIGH (ref 6–23)
CO2: 22 mEq/L (ref 19–32)
Calcium: 9.3 mg/dL (ref 8.4–10.5)
Chloride: 103 mEq/L (ref 96–112)
Creatinine, Ser: 1.32 mg/dL (ref 0.40–1.50)
GFR: 56.07 mL/min — ABNORMAL LOW (ref >60.00)
Glucose, Bld: 157 mg/dL — ABNORMAL HIGH (ref 70–99)
Potassium: 4.6 mEq/L (ref 3.5–5.1)
Sodium: 140 mEq/L (ref 135–145)
Total Bilirubin: 1.2 mg/dL (ref 0.2–1.2)
Total Protein: 6.6 g/dL (ref 6.0–8.3)

## 2022-07-15 LAB — MICROALBUMIN / CREATININE URINE RATIO
Creatinine,U: 110 mg/dL
Microalb Creat Ratio: 1.1 mg/g (ref 0.0–30.0)
Microalb, Ur: 1.3 mg/dL (ref 0.0–1.9)

## 2022-07-15 LAB — LIPID PANEL
Cholesterol: 158 mg/dL (ref 0–200)
HDL: 33.7 mg/dL — ABNORMAL LOW (ref >39.00)
LDL Cholesterol: 88 mg/dL (ref 0–99)
NonHDL: 124.12
Total CHOL/HDL Ratio: 5
Triglycerides: 182 mg/dL — ABNORMAL HIGH (ref 0.0–149.0)
VLDL: 36.4 mg/dL (ref 0.0–40.0)

## 2022-07-15 LAB — HEMOGLOBIN A1C: Hgb A1c MFr Bld: 7.2 % — ABNORMAL HIGH (ref 4.6–6.5)

## 2022-07-20 DIAGNOSIS — J300 Vasomotor rhinitis: Secondary | ICD-10-CM | POA: Diagnosis not present

## 2022-07-20 DIAGNOSIS — Z85818 Personal history of malignant neoplasm of other sites of lip, oral cavity, and pharynx: Secondary | ICD-10-CM | POA: Diagnosis not present

## 2022-07-22 ENCOUNTER — Ambulatory Visit (INDEPENDENT_AMBULATORY_CARE_PROVIDER_SITE_OTHER): Payer: Medicare HMO | Admitting: Family Medicine

## 2022-07-22 ENCOUNTER — Encounter: Payer: Self-pay | Admitting: Family Medicine

## 2022-07-22 VITALS — BP 128/78 | HR 80 | Temp 97.7°F | Ht 69.5 in | Wt 231.4 lb

## 2022-07-22 DIAGNOSIS — N289 Disorder of kidney and ureter, unspecified: Secondary | ICD-10-CM

## 2022-07-22 DIAGNOSIS — I6523 Occlusion and stenosis of bilateral carotid arteries: Secondary | ICD-10-CM

## 2022-07-22 DIAGNOSIS — I700 Atherosclerosis of aorta: Secondary | ICD-10-CM | POA: Diagnosis not present

## 2022-07-22 DIAGNOSIS — N529 Male erectile dysfunction, unspecified: Secondary | ICD-10-CM

## 2022-07-22 DIAGNOSIS — K219 Gastro-esophageal reflux disease without esophagitis: Secondary | ICD-10-CM

## 2022-07-22 DIAGNOSIS — E1169 Type 2 diabetes mellitus with other specified complication: Secondary | ICD-10-CM

## 2022-07-22 DIAGNOSIS — C099 Malignant neoplasm of tonsil, unspecified: Secondary | ICD-10-CM

## 2022-07-22 DIAGNOSIS — I10 Essential (primary) hypertension: Secondary | ICD-10-CM

## 2022-07-22 DIAGNOSIS — Z0000 Encounter for general adult medical examination without abnormal findings: Secondary | ICD-10-CM | POA: Diagnosis not present

## 2022-07-22 DIAGNOSIS — L309 Dermatitis, unspecified: Secondary | ICD-10-CM

## 2022-07-22 DIAGNOSIS — Z7189 Other specified counseling: Secondary | ICD-10-CM

## 2022-07-22 DIAGNOSIS — I77819 Aortic ectasia, unspecified site: Secondary | ICD-10-CM

## 2022-07-22 DIAGNOSIS — R195 Other fecal abnormalities: Secondary | ICD-10-CM

## 2022-07-22 DIAGNOSIS — E669 Obesity, unspecified: Secondary | ICD-10-CM

## 2022-07-22 MED ORDER — LISINOPRIL 10 MG PO TABS
10.0000 mg | ORAL_TABLET | Freq: Every day | ORAL | 4 refills | Status: AC
Start: 2022-07-22 — End: ?

## 2022-07-22 MED ORDER — ATORVASTATIN CALCIUM 40 MG PO TABS: 40.0000 mg | ORAL_TABLET | Freq: Every day | ORAL | 4 refills | Status: DC

## 2022-07-22 MED ORDER — TRIAMCINOLONE ACETONIDE 0.1 % EX CREA: 1.0000 | TOPICAL_CREAM | Freq: Two times a day (BID) | CUTANEOUS | 0 refills | Status: AC

## 2022-07-22 MED ORDER — TRIAMCINOLONE ACETONIDE 0.1 % EX CREA: 1.0000 | TOPICAL_CREAM | Freq: Two times a day (BID) | CUTANEOUS | 0 refills | Status: DC

## 2022-07-22 MED ORDER — SILDENAFIL CITRATE 100 MG PO TABS: 100.0000 mg | ORAL_TABLET | Freq: Every day | ORAL | 5 refills | Status: DC | PRN

## 2022-07-22 MED ORDER — METFORMIN HCL 500 MG PO TABS: ORAL_TABLET | ORAL | 4 refills | Status: DC

## 2022-07-22 NOTE — Assessment & Plan Note (Signed)
Advanced directive - does not have set up. Packet provided today. HCPOA would be [name]. Full code. Doesn't want prolonged life support if terminal condition.

## 2022-07-22 NOTE — Assessment & Plan Note (Signed)
Viagra refilled.

## 2022-07-22 NOTE — Assessment & Plan Note (Signed)
Continue omeprazole OTC 10-20mg  daily

## 2022-07-22 NOTE — Assessment & Plan Note (Signed)

## 2022-07-22 NOTE — Assessment & Plan Note (Signed)
Update aorta ultrasound.

## 2022-07-22 NOTE — Assessment & Plan Note (Signed)
Encourage healthy diet and lifestyle choices to affect sustainable weight loss.  

## 2022-07-22 NOTE — Assessment & Plan Note (Signed)
Reviewed GFR trend over the past year. He has h/o ureteral stones with atrophic L kidney. If progressive, will need reimaging of kidneys.

## 2022-07-22 NOTE — Assessment & Plan Note (Addendum)
Chronic, deteriorated control based on A1c >7% Reviewed diet choices to improve glycemic control.  Continue metformin 500mg  daily. Reassess at 6 mo DM f/u visit

## 2022-07-22 NOTE — Assessment & Plan Note (Signed)
Rx triamcinolone PRN.

## 2022-07-22 NOTE — Assessment & Plan Note (Signed)
Continue aspirin, statin.  

## 2022-07-22 NOTE — Assessment & Plan Note (Signed)
Reviewed need to repeat colonoscopy. He declines repeat at this time, desires to reassess next year.

## 2022-07-22 NOTE — Assessment & Plan Note (Signed)
Chronic, on atorvastatin 40mg  with LDL 88. The 10-year ASCVD risk score (Arnett DK, et al., 2019) is: 30%   Values used to calculate the score:     Age: 67 years     Sex: Male     Is Non-Hispanic African American: No     Diabetic: Yes     Tobacco smoker: No     Systolic Blood Pressure: 128 mmHg     Is BP treated: Yes     HDL Cholesterol: 33.7 mg/dL     Total Cholesterol: 158 mg/dL

## 2022-07-22 NOTE — Assessment & Plan Note (Addendum)
Chronic, stable on current regimen - continue. 

## 2022-07-22 NOTE — Patient Instructions (Addendum)
We will order repeat abdominal aorta imaging in .  Consider repeat colonoscopy as you're due. You're due for lung CT for lung cancer screening program - may call to schedule at [phone]  Consider RSV shot through pharmacy You are doing well today Advanced directive packet provided today.  Return in 6 months for diabetes follow up visit.

## 2022-07-22 NOTE — Assessment & Plan Note (Addendum)
Stable period released from onc care, continues in remission. Sees ENT yearly.

## 2022-07-22 NOTE — Assessment & Plan Note (Signed)
No bruit appreciated today.  

## 2022-07-22 NOTE — Assessment & Plan Note (Addendum)
Preventative protocols reviewed and updated unless pt declined. Discussed healthy diet and lifestyle.  Encouraged reschedule colonoscopy, # provided to call for lung cancer screening CT.

## 2022-07-22 NOTE — Progress Notes (Signed)
Ph: [phone] Fax: [phone]   Patient ID: William Johns., male    DOB: October 02, 1955, 67 y.o.   MRN: [id number]  This visit was conducted in person.  BP 128/78   Pulse 80   Temp 97.7 F (36.5 C) (Temporal)   Ht 5' 9.5" (1.765 m)   Wt 231 lb 6 oz (105 kg)   SpO2 96%   BMI 33.68 kg/m    CC: CPE/AMW Subjective:   HPI: William Johns. is a 67 y.o. male presenting on 07/22/2022 for Medicare Wellness   Did not see health advisor this year.   Hearing Screening   500Hz  1000Hz  2000Hz  4000Hz   Right ear 20 25 20 25   Left ear 20 20 20 25   Vision Screening - Comments:: Last eye exam, 11/2021.  Flowsheet Row Office Visit from 07/08/2021 in [facility] [place]  PHQ-2 Total Score 0          07/08/2021   11:25 AM  Fall Risk   Falls in the past year? 0   Aspirin on hold - planning dental work.    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 Released from rad onc care ([place]). Sees ENT [facility]ne [facility]) yearly. Constant ringing in bilateral ears since cancer treatment (chemo).   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   H/o ureteral stones with atrophic L kidney, saw Dr Richardo Hanks urology recently s/p shockwave lithotripsy 01/2021.   Preventative: COLONOSCOPY WITH PROPOFOL 07/28/2021 - HPs, poor prep, rpt next day Allegra Lai, Loel Dubonnet, MD)  COLONOSCOPY 07/29/2021 - rpt colonoscopy - 1 HP, int hem, rpt with 2d prep in 6 mo (Vanga, Loel Dubonnet, MD)  Prostate cancer screening - PSA yearly. Nocturia x1-2.  Lung cancer screening - ~30 PY hx. Quit 2009. fmhx lung cancer. Yearly CT lung screens, last 05/2021  Flu shot yearly  COVID vaccine - Pfizer 04/2019, 05/2019, no booster Pnemovax 2014, Prevnar20 06/2021 Tdap 2013 RSV - discussed Shingrix - 09/2019, 02/2020  Advanced directive - does not have  set up. Packet provided today. HCPOA would be [name]. Full code. Doesn't want prolonged life support if terminal condition.  Seat belt use discussed.  Sunscreen use discussed. No changing moles on skin.  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 Interim medical history since our last visit reviewed. Allergies and medications reviewed and updated. Outpatient Medications Prior to Visit  Medication Sig Dispense Refill   aspirin 81 MG tablet Take 81 mg by mouth daily.     ipratropium (ATROVENT) 0.06 % nasal spray Place into both nostrils.     Multiple Vitamin (MULTIVITAMIN) tablet Take 1 tablet by mouth daily.     OMEGA-3 FATTY ACIDS PO Take 1 capsule by mouth daily.     omeprazole (PRILOSEC OTC) 20 MG tablet Take 20 mg by mouth daily. Alternates 1 tablet one day, then 1/2  tablet next day     atorvastatin (LIPITOR) 40 MG tablet Take 1 tablet (40 mg total) by mouth daily. 90 tablet 3   lisinopril (ZESTRIL) 10 MG tablet Take 1 tablet (10 mg total) by mouth daily. 90 tablet 3   metFORMIN (GLUCOPHAGE) 500 MG tablet TAKE 1 TABLET BY MOUTH EVERY DAY WITH BREAKFAST 90 tablet 0   sildenafil (VIAGRA) 100 MG tablet Take 100 mg by mouth daily as needed for erectile dysfunction.     No facility-administered medications prior to visit.     Per HPI unless specifically indicated in ROS section below Review of Systems  Constitutional:  Negative for activity change, appetite change, chills, fatigue, fever and unexpected weight change.  HENT:  Negative for hearing  loss.   Eyes:  Negative for visual disturbance.  Respiratory:  Negative for cough, chest tightness, shortness of breath and wheezing.   Cardiovascular:  Negative for chest pain, palpitations and leg swelling.  Gastrointestinal:  Negative for abdominal distention, abdominal pain, blood in stool, constipation, diarrhea, nausea and vomiting.  Genitourinary:  Negative for difficulty urinating and hematuria.  Musculoskeletal:  Negative for arthralgias, myalgias and neck pain.  Skin:  Negative for rash.  Neurological:  Negative for dizziness, seizures, syncope and headaches.  Hematological:  Negative for adenopathy. Bruises/bleeds easily.  Psychiatric/Behavioral:  Negative for dysphoric mood. The patient is not nervous/anxious.     Objective:  BP 128/78   Pulse 80   Temp 97.7 F (36.5 C) (Temporal)   Ht 5' 9.5" (1.765 m)   Wt 231 lb 6 oz (105 kg)   SpO2 96%   BMI 33.68 kg/m   Wt Readings from Last 3 Encounters:  07/22/22 231 lb 6 oz (105 kg)  10/08/21 228 lb 2 oz (103.5 kg)  07/28/21 235 lb (106.6 kg)      Physical Exam Vitals and nursing note reviewed.  Constitutional:      General: He is not in acute distress.    Appearance: Normal appearance. He is well-developed. He is not ill-appearing.  HENT:     Head: Normocephalic and atraumatic.     Right Ear: Hearing, tympanic membrane, ear canal and external ear normal.     Left Ear: Hearing, tympanic membrane, ear canal and external ear normal.     Nose: Nose normal.     Mouth/Throat:     Mouth: Mucous membranes are moist.     Pharynx: Oropharynx is clear. No oropharyngeal exudate or posterior oropharyngeal erythema.  Eyes:     General: No scleral icterus.    Extraocular Movements: Extraocular movements intact.     Conjunctiva/sclera: Conjunctivae normal.     Pupils: Pupils are equal, round, and reactive to light.  Neck:     Thyroid: No thyroid mass or thyromegaly.     Vascular: No carotid bruit.  Cardiovascular:     Rate and  Rhythm: Normal rate and regular rhythm.     Pulses: Normal pulses.          Radial pulses are 2+ on the right side and 2+ on the left side.     Heart sounds: Murmur (2/6 systolic mild RUSB) heard.  Pulmonary:     Effort: Pulmonary effort is normal. No respiratory distress.     Breath sounds: No wheezing, rhonchi or rales.     Comments: Bibasilar crackles Abdominal:     General: Bowel sounds are normal. There is no distension.     Palpations: Abdomen is soft. There is no mass.  Tenderness: There is no abdominal tenderness. There is no guarding or rebound.     Hernia: No hernia is present.  Musculoskeletal:        General: Normal range of motion.     Cervical back: Normal range of motion and neck supple.     Right lower leg: No edema.     Left lower leg: No edema.     Comments:  Diminished pedal pulses  Lymphadenopathy:     Cervical: No cervical adenopathy.  Skin:    General: Skin is warm and dry.     Findings: No rash.  Neurological:     General: No focal deficit present.     Mental Status: He is alert and oriented to person, place, and time.     Comments:  Recall 3/3 Calculation 3/5 serial 7s  Psychiatric:        Mood and Affect: Mood normal.        Behavior: Behavior normal.        Thought Content: Thought content normal.        Judgment: Judgment normal.       Results for orders placed or performed in visit on 07/15/22  PSA  Result Value Ref Range   PSA 2.34 0.10 - 4.00 ng/mL  Comprehensive metabolic panel  Result Value Ref Range   Sodium 140 135 - 145 mEq/L   Potassium 4.6 3.5 - 5.1 mEq/L   Chloride 103 96 - 112 mEq/L   CO2 22 19 - 32 mEq/L   Glucose, Bld 157 (H) 70 - 99 mg/dL   BUN 27 (H) 6 - 23 mg/dL   Creatinine, Ser 4.54 0.40 - 1.50 mg/dL   Total Bilirubin 1.2 0.2 - 1.2 mg/dL   Alkaline Phosphatase 69 39 - 117 U/L   AST 20 0 - 37 U/L   ALT 27 0 - 53 U/L   Total Protein 6.6 6.0 - 8.3 g/dL   Albumin 4.1 3.5 - 5.2 g/dL   GFR 09.81 (L) >19.14 mL/min    Calcium 9.3 8.4 - 10.5 mg/dL  Lipid panel  Result Value Ref Range   Cholesterol 158 0 - 200 mg/dL   Triglycerides 782.9 (H) 0.0 - 149.0 mg/dL   HDL 56.21 (L) >30.86 mg/dL   VLDL 57.8 0.0 - 46.9 mg/dL   LDL Cholesterol 88 0 - 99 mg/dL   Total CHOL/HDL Ratio 5    NonHDL 124.12   Microalbumin / creatinine urine ratio  Result Value Ref Range   Microalb, Ur 1.3 0.0 - 1.9 mg/dL   Creatinine,U 629.5 mg/dL   Microalb Creat Ratio 1.1 0.0 - 30.0 mg/g  Hemoglobin A1c  Result Value Ref Range   Hgb A1c MFr Bld 7.2 (H) 4.6 - 6.5 %    Assessment & Plan:   Problem List Items Addressed This Visit     Health maintenance examination (Chronic)    Preventative protocols reviewed and updated unless pt declined. Discussed healthy diet and lifestyle.  Encouraged reschedule colonoscopy, # provided to call for lung cancer screening CT.       Advanced directives, counseling/discussion (Chronic)    Advanced directive - does not have set up. Packet provided today. HCPOA would be [name]. Full code. Doesn't want prolonged life support if terminal condition.       Medicare annual wellness visit, initial - Primary (Chronic)    I have personally reviewed the Medicare Annual Wellness questionnaire and have noted 1. The patient's medical and social history 2. Their use  of alcohol, tobacco or illicit drugs 3. Their current medications and supplements 4. The patient's functional ability including ADL's, fall risks, home safety risks and hearing or visual impairment. Cognitive function has been assessed and addressed as indicated.  5. Diet and physical activity 6. Evidence for depression or mood disorders The patients weight, height, BMI have been recorded in the chart. I have made referrals, counseling and provided education to the patient based on review of the above and I have provided the pt with a written personalized care plan for preventive services. Provider list updated.. See scanned questionairre as  needed for further documentation. Reviewed preventative protocols and updated unless pt declined.       HTN (hypertension)    Chronic, stable on current regimen - continue.       Relevant Medications   sildenafil (VIAGRA) 100 MG tablet   atorvastatin (LIPITOR) 40 MG tablet   lisinopril (ZESTRIL) 10 MG tablet   Dyslipidemia associated with type 2 diabetes mellitus (HCC)    Chronic, on atorvastatin 40mg  with LDL 88. The 10-year ASCVD risk score (Arnett DK, et al., 2019) is: 30%   Values used to calculate the score:     Age: 22 years     Sex: Male     Is Non-Hispanic African American: No     Diabetic: Yes     Tobacco smoker: No     Systolic Blood Pressure: 128 mmHg     Is BP treated: Yes     HDL Cholesterol: 33.7 mg/dL     Total Cholesterol: 158 mg/dL       Relevant Medications   metFORMIN (GLUCOPHAGE) 500 MG tablet   atorvastatin (LIPITOR) 40 MG tablet   lisinopril (ZESTRIL) 10 MG tablet   ED (erectile dysfunction)    Viagra refilled      GERD (gastroesophageal reflux disease)    Continue omeprazole OTC 10-20mg  daily      Obesity, Class I, BMI 30-34.9    Encourage healthy diet and lifestyle choices to affect sustainable weight loss.       Type 2 diabetes mellitus with other specified complication (HCC)    Chronic, deteriorated control based on A1c >7% Reviewed diet choices to improve glycemic control.  Continue metformin 500mg  daily. Reassess at 6 mo DM f/u visit       Relevant Medications   metFORMIN (GLUCOPHAGE) 500 MG tablet   atorvastatin (LIPITOR) 40 MG tablet   lisinopril (ZESTRIL) 10 MG tablet   Eczema    Rx triamcinolone PRN.      Abdominal aortic atherosclerosis (HCC)    Continue aspirin, statin.       Relevant Medications   sildenafil (VIAGRA) 100 MG tablet   atorvastatin (LIPITOR) 40 MG tablet   lisinopril (ZESTRIL) 10 MG tablet   Other Relevant Orders   VAS [place] AAA DUPLEX   Aortic dilatation (HCC)    Update aorta ultrasound.        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 Sees ENT yearly.       Carotid stenosis, asymptomatic, bilateral    No bruit appreciated today.       Relevant Medications   sildenafil (VIAGRA) 100 MG tablet   atorvastatin (LIPITOR) 40 MG  tablet   lisinopril (ZESTRIL) 10 MG tablet   Positive colorectal cancer screening using Cologuard test    Reviewed need to repeat colonoscopy. He declines repeat at this time, desires to reassess next year.       Renal insufficiency    Reviewed GFR trend over the past year. He has h/o ureteral stones with atrophic L kidney. If progressive, will need reimaging of kidneys.         Meds ordered this encounter  Medications   DISCONTD: triamcinolone cream (KENALOG) 0.1 %    Sig: Apply 1 Application topically 2 (two) times daily. Apply to AA.    Dispense:  30 g    Refill:  0   triamcinolone cream (KENALOG) 0.1 %    Sig: Apply 1 Application topically 2 (two) times daily. Apply to AA, no more than 1 week at a time    Dispense:  30 g    Refill:  0   metFORMIN (GLUCOPHAGE) 500 MG tablet    Sig: TAKE 1 TABLET BY MOUTH EVERY DAY WITH BREAKFAST    Dispense:  90 tablet    Refill:  4   sildenafil (VIAGRA) 100 MG tablet    Sig: Take 1 tablet (100 mg total) by mouth daily as needed for erectile dysfunction.    Dispense:  10 tablet    Refill:  5   atorvastatin (LIPITOR) 40 MG tablet    Sig: Take 1 tablet (40 mg total) by mouth daily.    Dispense:  90 tablet    Refill:  4   lisinopril (ZESTRIL) 10 MG tablet    Sig: Take 1 tablet (10 mg total) by mouth daily.    Dispense:  90 tablet    Refill:  4    No orders of the defined types were placed in this encounter.   Patient Instructions  We will order repeat  abdominal aorta imaging in [place].  Consider repeat colonoscopy as you're due. You're due for lung CT for lung cancer screening program - may call to schedule at [phone]  Consider RSV shot through pharmacy You are doing well today Advanced directive packet provided today.  Return in 6 months for diabetes follow up visit.   Follow up plan: Return in about 6 months (around 01/21/2023) for follow up visit.  Eustaquio Boyden, MD

## 2022-08-31 ENCOUNTER — Other Ambulatory Visit: Payer: Medicare HMO

## 2022-09-03 ENCOUNTER — Encounter: Payer: Self-pay | Admitting: *Deleted

## 2022-09-24 ENCOUNTER — Other Ambulatory Visit: Payer: Medicare HMO

## 2022-09-25 ENCOUNTER — Telehealth: Payer: Self-pay | Admitting: Family Medicine

## 2022-09-25 DIAGNOSIS — Z0279 Encounter for issue of other medical certificate: Secondary | ICD-10-CM

## 2022-09-25 NOTE — Telephone Encounter (Signed)
Spoke with pt notifying him the form is ready to pick up and there's a $20 fee to have it completed. Pt verbalizes understanding.   [Placed form at front office. Made copy to scan.]

## 2022-09-25 NOTE — Telephone Encounter (Signed)
Placed form in Dr. G's box.  

## 2022-09-25 NOTE — Telephone Encounter (Addendum)
Filled and in lisa's box. RCRI = 0  Anticipate adequately low risk to proceed with planned dental work.

## 2022-09-25 NOTE — Telephone Encounter (Signed)
Patient dropped off document Surgical Clearance, to be filled out by provider. Patient requested to send it back via Fax within ASAP. Document is located in providers tray at front office.Please advise at [facility] [phone]

## 2022-10-22 ENCOUNTER — Other Ambulatory Visit: Payer: Medicare HMO

## 2022-11-10 ENCOUNTER — Other Ambulatory Visit: Payer: Medicare HMO

## 2022-11-19 ENCOUNTER — Ambulatory Visit: Payer: Medicare HMO | Attending: Family Medicine

## 2022-11-19 DIAGNOSIS — I77819 Aortic ectasia, unspecified site: Secondary | ICD-10-CM

## 2022-11-19 DIAGNOSIS — I700 Atherosclerosis of aorta: Secondary | ICD-10-CM

## 2022-12-22 DIAGNOSIS — H524 Presbyopia: Secondary | ICD-10-CM | POA: Diagnosis not present

## 2022-12-22 DIAGNOSIS — E119 Type 2 diabetes mellitus without complications: Secondary | ICD-10-CM | POA: Diagnosis not present

## 2022-12-22 DIAGNOSIS — H5203 Hypermetropia, bilateral: Secondary | ICD-10-CM | POA: Diagnosis not present

## 2022-12-22 DIAGNOSIS — H52221 Regular astigmatism, right eye: Secondary | ICD-10-CM | POA: Diagnosis not present

## 2022-12-22 LAB — HM DIABETES EYE EXAM

## 2023-01-01 ENCOUNTER — Ambulatory Visit (INDEPENDENT_AMBULATORY_CARE_PROVIDER_SITE_OTHER): Payer: Medicare HMO

## 2023-01-01 DIAGNOSIS — Z23 Encounter for immunization: Secondary | ICD-10-CM

## 2023-01-18 DIAGNOSIS — Z85818 Personal history of malignant neoplasm of other sites of lip, oral cavity, and pharynx: Secondary | ICD-10-CM | POA: Diagnosis not present

## 2023-01-18 DIAGNOSIS — J300 Vasomotor rhinitis: Secondary | ICD-10-CM | POA: Diagnosis not present

## 2023-01-18 DIAGNOSIS — Z08 Encounter for follow-up examination after completed treatment for malignant neoplasm: Secondary | ICD-10-CM | POA: Diagnosis not present

## 2023-01-22 ENCOUNTER — Encounter: Payer: Self-pay | Admitting: Family Medicine

## 2023-01-22 ENCOUNTER — Ambulatory Visit (INDEPENDENT_AMBULATORY_CARE_PROVIDER_SITE_OTHER): Payer: Medicare HMO | Admitting: Family Medicine

## 2023-01-22 VITALS — BP 130/82 | HR 84 | Temp 98.4°F | Ht 69.5 in | Wt 230.1 lb

## 2023-01-22 DIAGNOSIS — Z7984 Long term (current) use of oral hypoglycemic drugs: Secondary | ICD-10-CM | POA: Diagnosis not present

## 2023-01-22 DIAGNOSIS — C099 Malignant neoplasm of tonsil, unspecified: Secondary | ICD-10-CM | POA: Diagnosis not present

## 2023-01-22 DIAGNOSIS — N289 Disorder of kidney and ureter, unspecified: Secondary | ICD-10-CM | POA: Diagnosis not present

## 2023-01-22 DIAGNOSIS — E1169 Type 2 diabetes mellitus with other specified complication: Secondary | ICD-10-CM

## 2023-01-22 LAB — POCT GLYCOSYLATED HEMOGLOBIN (HGB A1C): Hemoglobin A1C: 7.6 % — AB (ref 4.0–5.6)

## 2023-01-22 MED ORDER — METFORMIN HCL 500 MG PO TABS: 500.0000 mg | ORAL_TABLET | Freq: Two times a day (BID) | ORAL | 4 refills | Status: DC

## 2023-01-22 NOTE — Assessment & Plan Note (Signed)
Renal insufficiency in h/o ureteral stone and h/o atrophic L kidney  Update renal function today. Low threshold to re-image kidneys if any change in function.

## 2023-01-22 NOTE — Assessment & Plan Note (Signed)
Chronic, deteriorated control despite metformin and diet changes. Discussed alternatives - will increase metformin to 500mg  BID.  Encouraged cut down on ice cream.  RTC 21mo lab visit only POC A1c RTC 6 mo CPE reassess sugar control at that time.

## 2023-01-22 NOTE — Patient Instructions (Addendum)
Labs today  Sugar remains too high - increase metformin to 500mg  twice daily. Schedule lab visit only (nonfasting) in 3 months.  Return as needed or in 6 months for physical.

## 2023-01-22 NOTE — Progress Notes (Signed)
Ph: [phone] Fax: [phone]   Patient ID: William Lager., male    DOB: May 04, 1955, 67 y.o.   MRN: [id number]  This visit was conducted in person.  BP 130/82   Pulse 84   Temp 98.4 F (36.9 C) (Oral)   Ht 5' 9.5" (1.765 m)   Wt 230 lb 2 oz (104.4 kg)   SpO2 96%   BMI 33.50 kg/m    CC: 6 mo DM f/u visit  Subjective:   HPI: William Quinn. is a 67 y.o. male presenting on 01/22/2023 for Medical Management of Chronic Issues (Here for 6 mo DM f/u.)   Recent extensive dental work - pending permanent dentures.   Had 2 falls this past year - fell off pick up truck, landed on concrete, fortunately no injury. Also fell off bed when replacing chandelier lights. No injury either.   He states he had lung cancer screening CT done 05/2022 - no records in our chart. There is record of one done 05/2021.   Recently saw Dr Jenne [facility] - clear exam in h/o R tonsillar cancer 2020 s/p chemoradiation. Residual tinnitus.   DM - does not regularly check sugars. Compliant with antihyperglycemic regimen which includes: metformin 500mg  daily. He's changed diet. No alcohol. Denies low sugars or hypoglycemic symptoms. Denies paresthesias, blurry vision. Last diabetic eye exam 12/2022. Glucometer brand: doesn't have. Last foot exam: 09/2021 - DUE. DSME: completed remotely at [facility] 2013. Lab Results  Component Value Date   HGBA1C 7.6 (A) 01/22/2023   Diabetic Foot Exam - Simple   Simple Foot Form Diabetic Foot exam was performed with the following findings: Yes 01/22/2023  3:18 PM  Visual Inspection No deformities, no ulcerations, no other skin breakdown bilaterally: Yes Sensation Testing Intact to touch and monofilament testing bilaterally: Yes Pulse Check Posterior Tibialis and Dorsalis pulse intact bilaterally: Yes Comments No claudication    Lab Results  Component Value Date   MICROALBUR 1.3 07/15/2022    Renal insufficiency in h/o ureteral stone and atrophic L kidney. Recheck imaging  today.      Relevant past medical, surgical, family and social history reviewed and updated as indicated. Interim medical history since our last visit reviewed. Allergies and medications reviewed and updated. Outpatient Medications Prior to Visit  Medication Sig Dispense Refill   aspirin 81 MG tablet Take 81 mg by mouth daily.     atorvastatin (LIPITOR) 40 MG tablet Take 1 tablet (40 mg total) by mouth daily. 90 tablet 4   ipratropium (ATROVENT) 0.06 % nasal spray Place into both nostrils.     lisinopril (ZESTRIL) 10 MG tablet Take 1 tablet (10 mg total) by mouth daily. 90 tablet 4   Multiple Vitamin (MULTIVITAMIN) tablet Take 1 tablet by mouth daily.     OMEGA-3 FATTY ACIDS PO Take 1 capsule by mouth daily.     omeprazole (PRILOSEC OTC) 20 MG tablet Take 20 mg by mouth daily. Alternates 1 tablet one day, then 1/2 tablet next day     sildenafil (VIAGRA) 100 MG tablet Take 1 tablet (100 mg total) by mouth daily as needed for erectile dysfunction. 10 tablet 5   triamcinolone cream (KENALOG) 0.1 % Apply 1 Application topically 2 (two) times daily. Apply to AA, no more than 1 week at a time 30 g 0   metFORMIN (GLUCOPHAGE) 500 MG tablet TAKE 1 TABLET BY MOUTH EVERY DAY WITH BREAKFAST 90 tablet 4   No facility-administered medications prior to visit.  Per HPI unless specifically indicated in ROS section below Review of Systems  Objective:  BP 130/82   Pulse 84   Temp 98.4 F (36.9 C) (Oral)   Ht 5' 9.5" (1.765 m)   Wt 230 lb 2 oz (104.4 kg)   SpO2 96%   BMI 33.50 kg/m   Wt Readings from Last 3 Encounters:  01/22/23 230 lb 2 oz (104.4 kg)  07/22/22 231 lb 6 oz (105 kg)  10/08/21 228 lb 2 oz (103.5 kg)      Physical Exam Vitals and nursing note reviewed.  Constitutional:      Appearance: Normal appearance. He is not ill-appearing.  Eyes:     Extraocular Movements: Extraocular movements intact.     Conjunctiva/sclera: Conjunctivae normal.     Pupils: Pupils are equal, round,  and reactive to light.  Cardiovascular:     Rate and Rhythm: Normal rate and regular rhythm.     Pulses: Normal pulses.     Heart sounds: Normal heart sounds. No murmur heard. Pulmonary:     Effort: Pulmonary effort is normal. No respiratory distress.     Breath sounds: Normal breath sounds. No wheezing, rhonchi or rales.  Musculoskeletal:     Right lower leg: No edema.     Left lower leg: No edema.     Comments: See HPI for foot exam if done  Skin:    General: Skin is warm and dry.     Findings: No rash.  Neurological:     Mental Status: He is alert.  Psychiatric:        Mood and Affect: Mood normal.        Behavior: Behavior normal.       Results for orders placed or performed in visit on 01/22/23  POCT glycosylated hemoglobin (Hb A1C)   Collection Time: 01/22/23  2:50 PM  Result Value Ref Range   Hemoglobin A1C 7.6 (A) 4.0 - 5.6 %   HbA1c POC (<> result, manual entry)     HbA1c, POC (prediabetic range)     HbA1c, POC (controlled diabetic range)      Assessment & Plan:   Problem List Items Addressed This Visit     Type 2 diabetes mellitus with other specified complication (HCC) - Primary   Chronic, deteriorated control despite metformin and diet changes. Discussed alternatives - will increase metformin to 500mg  BID.  Encouraged cut down on ice cream.  RTC 60mo lab visit only POC A1c RTC 6 mo CPE reassess sugar control at that time.       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  He is 4 years cancer free.       Renal insufficiency   Renal insufficiency in h/o ureteral stone and h/o atrophic L kidney  Update renal function today. Low threshold to re-image kidneys if any change in function.         Meds ordered  this encounter  Medications   metFORMIN (GLUCOPHAGE) 500 MG tablet    Sig: Take 1 tablet (500 mg total) by mouth 2 (two) times daily with a meal.    Dispense:  180 tablet    Refill:  4    Orders Placed This Encounter  Procedures   Renal  function panel   POCT glycosylated hemoglobin (Hb A1C)   POCT glycosylated hemoglobin (Hb A1C)    Standing Status:   Future    Expiration Date:   01/22/2024    Patient Instructions  Labs today  Sugar remains too high - increase metformin to 500mg  twice daily. Schedule lab visit only (nonfasting) in 3 months.  Return as needed or in 6 months for physical.   Follow up plan: Return in about 6 months (around 07/23/2023), or if symptoms worsen or fail to improve, for annual exam, prior fasting for blood work.  Eustaquio Boyden, MD

## 2023-01-22 NOTE — Assessment & Plan Note (Signed)
Reassuring ENT eval earlier this year.  He is 4 years cancer free.

## 2023-01-23 LAB — RENAL FUNCTION PANEL
Albumin: 4.4 g/dL (ref 3.6–5.1)
BUN: 16 mg/dL (ref 7–25)
CO2: 27 mmol/L (ref 20–32)
Calcium: 10 mg/dL (ref 8.6–10.3)
Chloride: 103 mmol/L (ref 98–110)
Creat: 1.24 mg/dL (ref 0.70–1.35)
Glucose, Bld: 182 mg/dL — ABNORMAL HIGH (ref 65–99)
Phosphorus: 3.7 mg/dL (ref 2.1–4.3)
Potassium: 4.4 mmol/L (ref 3.5–5.3)
Sodium: 141 mmol/L (ref 135–146)

## 2023-01-27 ENCOUNTER — Encounter: Payer: Self-pay | Admitting: Family Medicine

## 2023-02-01 MED ORDER — BLOOD GLUCOSE MONITORING SUPPL DEVI: 1.0000 | Freq: Three times a day (TID) | 0 refills | Status: AC

## 2023-02-01 MED ORDER — BLOOD GLUCOSE TEST VI STRP: 1.0000 | ORAL_STRIP | Freq: Three times a day (TID) | 0 refills | Status: DC

## 2023-02-01 MED ORDER — LANCET DEVICE MISC: 1.0000 | Freq: Three times a day (TID) | 0 refills | Status: AC

## 2023-02-01 MED ORDER — LANCETS MISC. MISC: 1.0000 | Freq: Three times a day (TID) | 0 refills | Status: AC

## 2023-03-10 ENCOUNTER — Other Ambulatory Visit: Payer: Self-pay | Admitting: Family Medicine

## 2023-03-10 DIAGNOSIS — E1169 Type 2 diabetes mellitus with other specified complication: Secondary | ICD-10-CM

## 2023-03-12 ENCOUNTER — Other Ambulatory Visit: Payer: Self-pay | Admitting: Family Medicine

## 2023-03-12 DIAGNOSIS — E1169 Type 2 diabetes mellitus with other specified complication: Secondary | ICD-10-CM

## 2023-04-23 ENCOUNTER — Other Ambulatory Visit: Payer: Medicare HMO

## 2023-04-23 ENCOUNTER — Telehealth: Payer: Self-pay | Admitting: Family Medicine

## 2023-04-23 DIAGNOSIS — E1169 Type 2 diabetes mellitus with other specified complication: Secondary | ICD-10-CM | POA: Diagnosis not present

## 2023-04-23 LAB — POCT GLYCOSYLATED HEMOGLOBIN (HGB A1C): Hemoglobin A1C: 6 % — AB (ref 4.0–5.6)

## 2023-04-23 NOTE — Telephone Encounter (Signed)
 Spoke with pt relaying Dr Timoteo Expose message. Pt verbalizes understanding. However, states he has still been taking metformin 500 mg 1 tab daily but has made major changes in diet. Pt states he will continue taking only 1 metformin tab daily for now and continue with diet & lifestyle changes. Pt will keep Dr Reece Agar updated.

## 2023-04-23 NOTE — Telephone Encounter (Addendum)
 Reviewed sugar readings - AM sugars 120-130s, PM sugars 110-170s, average 132.   Lab Results  Component Value Date   HGBA1C 6.0 (A) 04/23/2023    A1c today markedly improved down to 6%!  Continue metformin 500mg  BID and healthy diet and lifestyle changes to date. Let us know if any questions or concerns.

## 2023-04-23 NOTE — Telephone Encounter (Signed)
 Patient came by and dropped off readings for blood sugar. Placed in box up front.

## 2023-04-23 NOTE — Addendum Note (Signed)
 Addended by: Eustaquio Boyden on: 04/23/2023 05:02 PM   Modules accepted: Orders

## 2023-04-23 NOTE — Telephone Encounter (Signed)
 Placed log in Dr. Timoteo Expose box.

## 2023-04-23 NOTE — Telephone Encounter (Addendum)
 Ok med list changed.

## 2023-06-28 ENCOUNTER — Ambulatory Visit: Payer: Self-pay

## 2023-06-28 NOTE — Telephone Encounter (Signed)
 Patient returned call. Declined visit with another provider at this time would like to hold off to be seen. Requested that we send something in for pain. Advised would need to be seen in office for evaluation for any medication. Patient declined. Would still like to wait until his pcp is back in office. Asked if we could make appointment for once he is back to have him eluated he declined. He will reach out if any increased or changed symptoms.  No further action needed at this time.

## 2023-06-28 NOTE — Telephone Encounter (Signed)
 Left message to return call to our office.

## 2023-06-28 NOTE — Telephone Encounter (Signed)
     Chief Complaint: Neck and shoulder pain Symptoms: pain Frequency: since Thursday Pertinent Negatives: Patient denies any other s/s Disposition: [] ED /[] Urgent Care (no appt availability in office) / [] Appointment(In office/virtual)/ []  [facility]/ [] Home Care/ [x] Refused Recommended Disposition /[] [facility]/ []  Follow-up with PCP Additional Notes: Pt refused O, he is too busy to come into the office. He took Robaxin  from 2015 with little effect. Pt has not tried OTC medication for relief. Pt wants something "good and strong" called in to help with pain. Robaxin  may have caused some dizziness. Please advise.  Copied from CRM [id number]. Topic: Clinical - Red Word Triage >> Jun 28, 2023  8:03 AM Kita Perish H wrote: [facility] that prompted transfer to Nurse Triage: Neck and back pain, pulled muscle and hurting really bad. Looking to have prescription for methocarbamol  (ROBAXIN ) 500 MG tablet sent in, had medication back in 2015 for same thing Reason for Disposition  [1] MODERATE neck pain (e.g., interferes with normal activities) AND [2] present > 3 days  Answer Assessment - Initial Assessment Questions 1. ONSET: "When did the pain begin?"      Last Thursday - 4 days 2. LOCATION: "Where does it hurt?"      Right shoulder blade. 3. PATTERN "Does the pain come and go, or has it been constant since it started?"      constant 4. SEVERITY: "How bad is the pain?"  (Scale 1-10; or mild, moderate, severe)   - NO PAIN (0): no pain or only slight stiffness    - MILD (1-3): doesn't interfere with normal activities    - MODERATE (4-7): interferes with normal activities or awakens from sleep    - SEVERE (8-10):  excruciating pain, unable to do any normal activities      Today 4/10 5. RADIATION: "Does the pain go anywhere else, shoot into your arms?"     no 6. CORD SYMPTOMS: "Any weakness or numbness of the arms or legs?"     no 7. CAUSE: "What do you think is causing the  neck pain?"     Picked up sofa the wrong way. 8. NECK OVERUSE: "Any recent activities that involved turning or twisting the neck?"     Yes - picked up sofa the wrong way. 9. OTHER SYMPTOMS: "Do you have any other symptoms?" (e.g., headache, fever, chest pain, difficulty breathing, neck swelling)     A little dizziness  Protocols used: Neck Pain or Stiffness-A-AH

## 2023-06-28 NOTE — Telephone Encounter (Signed)
 Agree. Thanks

## 2023-06-28 NOTE — Telephone Encounter (Signed)
 Pt called back, had changed mind about being seen d/t having dizziness. Checked appts and none today or with PCP until 06/30/23. Pt states he will wait for nurse to call him back. Advised I could call [facility] but pt states he will wait and see if he gets better. Advised I would send message and if sx get worse to call back, also recommended if dizziness occurs to sit and rest and increase fluid intake. Pt verbalized understanding.

## 2023-06-30 ENCOUNTER — Other Ambulatory Visit: Payer: Self-pay | Admitting: Family Medicine

## 2023-06-30 MED ORDER — METHOCARBAMOL 500 MG PO TABS: 500.0000 mg | ORAL_TABLET | Freq: Three times a day (TID) | ORAL | 0 refills | Status: DC | PRN

## 2023-06-30 NOTE — Telephone Encounter (Signed)
 Recommend ice/heat whichever soothes better, may take ibuprofen  400-600mg  with meals for next 3-5 days, I have refilled robaxin  muscle relaxant for him to try. Agree with OV if not improving.

## 2023-06-30 NOTE — Progress Notes (Signed)
 See phone note. Robaxin  Rx

## 2023-06-30 NOTE — Telephone Encounter (Signed)
 Spoke with pt relaying Dr Ocie Belt message. Pt verbalizes understanding. States heat has helped and states he's doing much better. Pt will pick up Robaxin  rx just in case but will schedule OV if sxs return.

## 2023-07-06 ENCOUNTER — Ambulatory Visit (INDEPENDENT_AMBULATORY_CARE_PROVIDER_SITE_OTHER)

## 2023-07-06 VITALS — BP 130/82 | Ht 69.5 in | Wt 230.0 lb

## 2023-07-06 DIAGNOSIS — Z5320 Procedure and treatment not carried out because of patient's decision for unspecified reasons: Secondary | ICD-10-CM

## 2023-07-06 DIAGNOSIS — Z0000 Encounter for general adult medical examination without abnormal findings: Secondary | ICD-10-CM | POA: Diagnosis not present

## 2023-07-06 DIAGNOSIS — Z2821 Immunization not carried out because of patient refusal: Secondary | ICD-10-CM

## 2023-07-06 NOTE — Patient Instructions (Signed)
 Mr. William Johns , Thank you for taking time out of your busy schedule to complete your Annual Wellness Visit with me. I enjoyed our conversation and look forward to speaking with you again next year. I, as well as your care team,  appreciate your ongoing commitment to your health goals. Please review the following plan we discussed and let me know if I can assist you in the future. Your Game plan/ To Do List    Referrals: None Follow up Visits: Next Medicare AWV with our clinical staff:  07/06/2024 Have you seen your provider in the last 6 months (3 months if uncontrolled diabetes)? No Next Office Visit with your provider: 07/23/2023  Clinician Recommendations:  Aim for 30 minutes of exercise or brisk walking, 6-8 glasses of water, and 5 servings of fruits and vegetables each day.       This is a list of the screening recommended for you and due dates:  Health Maintenance  Topic Date Due   COVID-19 Vaccine (3 - Pfizer risk series) 06/28/2019   DTaP/Tdap/Td vaccine (2 - Td or Tdap) 11/24/2021   Colon Cancer Screening  07/30/2022   Yearly kidney health urinalysis for diabetes  07/15/2023   Flu Shot  09/10/2023   Hemoglobin A1C  10/24/2023   Eye exam for diabetics  12/22/2023   Yearly kidney function blood test for diabetes  01/22/2024   Complete foot exam   01/22/2024   Medicare Annual Wellness Visit  07/05/2024   Pneumonia Vaccine  Completed   Hepatitis C Screening  Completed   Zoster (Shingles) Vaccine  Completed   HPV Vaccine  Aged Out   Meningitis B Vaccine  Aged Out   Screening for Lung Cancer  Discontinued    Advanced directives: (Declined) Advance directive discussed with you today. Even though you declined this today, please call our office should you change your mind, and we can give you the proper paperwork for you to fill out. Advance Care Planning is important because it:  [x]  Makes sure you receive the medical care that is consistent with your values, goals, and  preferences  [x]  It provides guidance to your family and loved ones and reduces their decisional burden about whether or not they are making the right decisions based on your wishes.  Follow the link provided in your after visit summary or read over the paperwork we have mailed to you to help you started getting your Advance Directives in place. If you need assistance in completing these, please reach out to us  so that we can help you!  See attachments for Preventive Care and Fall Prevention Tips.

## 2023-07-06 NOTE — Progress Notes (Signed)
 Because this visit was a virtual/telehealth visit,  certain criteria was not obtained, such a blood pressure, CBG if applicable, and timed get up and go. Any medications not marked as "taking" were not mentioned during the medication reconciliation part of the visit. Any vitals not documented were not able to be obtained due to this being a telehealth visit or patient was unable to self-report a recent blood pressure reading due to a lack of equipment at home via telehealth. Vitals that have been documented are verbally provided by the patient.   This visit was performed by a medical professional under my direct supervision. I was immediately available for consultation/collaboration. I have reviewed and agree with the Annual Wellness Visit documentation.  Subjective:   William G Donner Jr. is a 68 y.o. who presents for a Medicare Wellness preventive visit.  As a reminder, Annual Wellness Visits don't include a physical exam, and some assessments may be limited, especially if this visit is performed virtually. We may recommend an in-person follow-up visit with your provider if needed.  Visit Complete: Virtual I connected with  William G Mccambridge Jr. on 07/06/23 by a audio enabled telemedicine application and verified that I am speaking with the correct person using two identifiers.  Patient Location: Home  Provider Location: Home Office  I discussed the limitations of evaluation and management by telemedicine. The patient expressed understanding and agreed to proceed.  Vital Signs: Because this visit was a virtual/telehealth visit, some criteria may be missing or patient reported. Any vitals not documented were not able to be obtained and vitals that have been documented are patient reported.  VideoDeclined- This patient declined Librarian, academic. Therefore the visit was completed with audio only.  Persons Participating in Visit: Patient.  AWV Questionnaire: No: Patient  Medicare AWV questionnaire was not completed prior to this visit.  Cardiac Risk Factors include: advanced age (>68men, >5 women);male gender;diabetes mellitus;hypertension;obesity (BMI >30kg/m2)     Objective:     Today's Vitals   07/06/23 1545  BP: 130/82  Weight: 230 lb (104.3 kg)  Height: 5' 9.5" (1.765 m)   Body mass index is 33.48 kg/m.     07/06/2023    3:49 PM 07/29/2021    7:11 AM 07/28/2021    7:49 AM 01/16/2021    7:56 AM 06/30/2019    9:32 AM 12/26/2018    9:02 AM 10/26/2018   12:02 PM  Advanced Directives  Does Patient Have a Medical Advance Directive? No No No No No No No  Would patient like information on creating a medical advance directive? No - Patient declined    No - Patient declined No - Patient declined No - Patient declined    Current Medications (verified) Outpatient Encounter Medications as of 07/06/2023  Medication Sig   Accu-Chek Softclix Lancets lancets Use as instructed to check blood sugar 2 times a day   aspirin 81 MG tablet Take 81 mg by mouth daily.   atorvastatin  (LIPITOR) 40 MG tablet Take 1 tablet (40 mg total) by mouth daily.   Blood Glucose Monitoring Suppl DEVI 1 each by Does not apply route in the morning, at noon, and at bedtime. E11.69. May substitute to any manufacturer covered by patient's insurance.   glucose blood (ACCU-CHEK GUIDE TEST) test strip Use as instructed to check blood sugar 3 times a day   ipratropium (ATROVENT) 0.06 % nasal spray Place into both nostrils.   lisinopril  (ZESTRIL ) 10 MG tablet Take 1 tablet (10 mg total)  by mouth daily.   metFORMIN  (GLUCOPHAGE ) 500 MG tablet Take 1 tablet (500 mg total) by mouth daily with breakfast.   methocarbamol  (ROBAXIN ) 500 MG tablet Take 1 tablet (500 mg total) by mouth every 8 (eight) hours as needed for muscle spasms (sedation precations).   Multiple Vitamin (MULTIVITAMIN) tablet Take 1 tablet by mouth daily.   OMEGA-3 FATTY ACIDS PO Take 1 capsule by mouth daily.   omeprazole  (PRILOSEC OTC) 20 MG tablet Take 20 mg by mouth daily. Alternates 1 tablet one day, then 1/2 tablet next day   sildenafil  (VIAGRA ) 100 MG tablet Take 1 tablet (100 mg total) by mouth daily as needed for erectile dysfunction.   triamcinolone  cream (KENALOG ) 0.1 % Apply 1 Application topically 2 (two) times daily. Apply to AA, no more than 1 week at a time   No facility-administered encounter medications on file as of 07/06/2023.    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    T2DM (type 2 diabetes mellitus) (HCC)    DSME at [facility] 01/2012   Tonsillar mass 05/23/2018   Past Surgical History:  Procedure Laterality Date   CHOLECYSTECTOMY  02/09/2005   COLONOSCOPY  11/09/2008   2 hyperplastic polyps Howard Macho)   COLONOSCOPY N/A 07/29/2021   rpt colonoscopy - 1 HP, int hem, rpt with 2d prep in 6 mo Baldomero Bone, Elson Halon, MD)   COLONOSCOPY WITH PROPOFOL  N/A 07/28/2021   HPs, poor prep, rpt next day Baldomero Bone, Elson Halon, MD)   EXTRACORPOREAL SHOCK WAVE LITHOTRIPSY Left 04/28/2018   Procedure: EXTRACORPOREAL SHOCK WAVE LITHOTRIPSY (ESWL);  Surgeon: Lawerence Pressman, MD;  Location: [facility] ORS;  Service: Urology;  Laterality: Left;   EXTRACORPOREAL SHOCK WAVE LITHOTRIPSY Right 01/16/2021   Procedure: EXTRACORPOREAL SHOCK WAVE LITHOTRIPSY (ESWL);  Surgeon: Lawerence Pressman, MD;  Location: [facility] ORS;  Service: Urology;  Laterality: Right;   LITHOTRIPSY  02/10/2011   x 2   Family History  Problem Relation Age of Onset   Cancer Father 20       lung, smoker   Aneurysm Mother 26       brain   Coronary artery disease Neg Hx    Stroke Neg Hx    Diabetes Neg Hx    Social History    Socioeconomic History   Marital status: Divorced    Spouse name: Not on file   Number of children: Not on file   Years of education: Not on file   Highest education level: Not on file  Occupational History   Not on file  Tobacco Use   Smoking status: Former    Current packs/day: 0.00    Average packs/day: 1 pack/day for 36.0 years (36.0 ttl pk-yrs)    Types: Cigarettes    Start date: 02/10/1971    Quit date: 02/10/2007    Years since quitting: 16.4   Smokeless tobacco: Never  Vaping Use   Vaping status: Never Used  Substance and Sexual Activity   Alcohol use: Not Currently    Comment: 5 drinks/week    Drug use: Yes    Types: Marijuana    Comment: rare MJ  Sexual activity: Yes    Birth control/protection: None  Other Topics Concern   Not on file  Social History Narrative   "Ambrosio Junker"    Lives with girlfriend, and her son, 2 pets   Occupation: self employed, 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   Edu: HS   Activity: walks 1 mi 5 x/wk   Diet: good water, fruits/vegetables daily, changed to diet soda   Social Drivers of Corporate investment banker Strain: Low Risk  (07/06/2023)   Overall Financial Resource Strain (CARDIA)    Difficulty of Paying Living Expenses: Not hard at all  Food Insecurity: No Food Insecurity (07/06/2023)   Hunger Vital Sign    Worried About Running Out of Food in the Last Year: Never true    Ran Out of Food in the Last Year: Never true  Transportation Needs: No Transportation Needs (07/06/2023)   PRAPARE - Administrator, Civil Service (Medical): No    Lack of Transportation (Non-Medical): No  Physical Activity: Sufficiently Active (07/06/2023)   Exercise Vital Sign    Days of Exercise per Week: 5 days    Minutes of Exercise per Session: 30 min  Stress: No Stress Concern Present (07/06/2023)   [employer] of Occupational Health - Occupational Stress Questionnaire    Feeling of Stress : Not at all  Social Connections: Moderately  Integrated (07/06/2023)   Social Connection and Isolation Panel [NHANES]    Frequency of Communication with Friends and Family: More than three times a week    Frequency of Social Gatherings with Friends and Family: More than three times a week    Attends Religious Services: More than 4 times per year    Active Member of [employer] or Organizations: No    Attends Engineer, structural: Never    Marital Status: Married    Tobacco Counseling Counseling given: Not Answered    Clinical Intake:  Pre-visit preparation completed: Yes  Pain : No/denies pain     BMI - recorded: 33.48 Nutritional Status: BMI > 30  Obese Nutritional Risks: None Diabetes: Yes CBG done?: No Did pt. bring in CBG monitor from home?: No  Lab Results  Component Value Date   HGBA1C 6.0 (A) 04/23/2023   HGBA1C 7.6 (A) 01/22/2023   HGBA1C 7.2 (H) 07/15/2022     How often do you need to have someone help you when you read instructions, pamphlets, or other written materials from your doctor or pharmacy?: 1 - Never  Interpreter Needed?: No  Information entered by :: Juliann Ochoa   Activities of Daily Living     07/06/2023    3:49 PM  In your present state of health, do you have any difficulty performing the following activities:  Hearing? 0  Vision? 0  Difficulty concentrating or making decisions? 0  Walking or climbing stairs? 0  Dressing or bathing? 0  Doing errands, shopping? 0  Preparing Food and eating ? N  Using the Toilet? N  In the past six months, have you accidently leaked urine? N  Do you have problems with loss of bowel control? N  Managing your Medications? N  Managing your Finances? N  Housekeeping or managing your Housekeeping? N    Patient Care Team: Claire Crick, MD as PCP - General (Family Medicine) Lesly Raspberry, MD (Otolaryngology) Shellie Dials, MD as Consulting Physician (Oncology) Glenis Langdon, MD as Referring Physician (Radiation  Oncology)  Indicate any recent Medical Services you may have received from other than [facility]  providers in the past year (date may be approximate).     Assessment:    This is a routine wellness examination for Kito.  Hearing/Vision screen Hearing Screening - Comments:: Patient has no hearing difficulties Vision Screening - Comments:: Patient wears glasses and up to date    Goals Addressed             This Visit's Progress    Patient Stated       Patient would like to keep living        Depression Screen     07/06/2023    3:50 PM 01/22/2023    2:49 PM 07/08/2021   11:25 AM 10/04/2020    9:24 AM 10/03/2019    9:30 AM 09/29/2018   11:43 AM 06/03/2017    8:35 AM  PHQ 2/9 Scores  PHQ - 2 Score 0 0 0 0 0 0 0  PHQ- 9 Score 0          Fall Risk     07/06/2023    3:49 PM 01/22/2023    2:49 PM 07/08/2021   11:25 AM  Fall Risk   Falls in the past year? 0 1 0  Number falls in past yr: 0 1   Injury with Fall? 0    Risk for fall due to : No Fall Risks    Follow up Falls evaluation completed      MEDICARE RISK AT HOME:  Medicare Risk at Home Any stairs in or around the home?: Yes If so, are there any without handrails?: No Home free of loose throw rugs in walkways, pet beds, electrical cords, etc?: Yes Adequate lighting in your home to reduce risk of falls?: Yes Life alert?: No Use of a cane, walker or w/c?: No Grab bars in the bathroom?: No Shower chair or bench in shower?: No Elevated toilet seat or a handicapped toilet?: No  TIMED UP AND GO:  Was the test performed?  No  Cognitive Function: 6CIT completed        07/06/2023    3:48 PM  6CIT Screen  What Year? 0 points  What month? 0 points  What time? 0 points  Count back from 20 0 points  Months in reverse 0 points  Repeat phrase 0 points  Total Score 0 points    Immunizations Immunization History  Administered Date(s) Administered   Fluad Quad(high Dose 65+) 12/18/2021   Fluad Trivalent(High Dose 65+)  01/01/2023   Influenza Inj Mdck Quad Pf 11/27/2016   Influenza Split 11/25/2011   Influenza,inj,Quad PF,6+ Mos 11/11/2012, 11/15/2013, 01/15/2015, 12/27/2018   PFIZER(Purple Top)SARS-COV-2 Vaccination 05/08/2019, 05/31/2019   PNEUMOCOCCAL CONJUGATE-20 07/08/2021   Pneumococcal Polysaccharide-23 11/11/2012   Tdap 11/25/2011   Zoster Recombinant(Shingrix) 10/03/2019, 02/15/2020    Screening Tests Health Maintenance  Topic Date Due   COVID-19 Vaccine (3 - Pfizer risk series) 06/28/2019   DTaP/Tdap/Td (2 - Td or Tdap) 11/24/2021   Colonoscopy  07/30/2022   Diabetic kidney evaluation - Urine ACR  07/15/2023   INFLUENZA VACCINE  09/10/2023   HEMOGLOBIN A1C  10/24/2023   OPHTHALMOLOGY EXAM  12/22/2023   Diabetic kidney evaluation - eGFR measurement  01/22/2024   FOOT EXAM  01/22/2024   Medicare Annual Wellness (AWV)  07/05/2024   Pneumonia Vaccine 78+ Years old  Completed   Hepatitis C Screening  Completed   Zoster Vaccines- Shingrix  Completed   HPV VACCINES  Aged Out   Meningococcal B Vaccine  Aged Out   Lung Cancer Screening  Discontinued    Health Maintenance  Health Maintenance Due  Topic Date Due   COVID-19 Vaccine (3 - Pfizer risk series) 06/28/2019   DTaP/Tdap/Td (2 - Td or Tdap) 11/24/2021   Colonoscopy  07/30/2022   Diabetic kidney evaluation - Urine ACR  07/15/2023   Health Maintenance Items Addressed:patient declined health maintenance   Additional Screening:  Vision Screening: Recommended annual ophthalmology exams for early detection of glaucoma and other disorders of the eye.  Dental Screening: Recommended annual dental exams for proper oral hygiene  Community Resource Referral / Chronic Care Management: CRR required this visit?  No   CCM required this visit?  No   Plan:    I have personally reviewed and noted the following in the patient's chart:   Medical and social history Use of alcohol, tobacco or illicit drugs  Current medications and  supplements including opioid prescriptions. Patient is not currently taking opioid prescriptions. Functional ability and status Nutritional status Physical activity Advanced directives List of other physicians Hospitalizations, surgeries, and ER visits in previous 12 months Vitals Screenings to include cognitive, depression, and falls Referrals and appointments  In addition, I have reviewed and discussed with patient certain preventive protocols, quality metrics, and best practice recommendations. A written personalized care plan for preventive services as well as general preventive health recommendations were provided to patient.   Freeda Jerry, [place]   07/06/2023   After Visit Summary: (MyChart) Due to this being a telephonic visit, the after visit summary with patients personalized plan was offered to patient via MyChart   Notes: Nothing significant to report at this time.

## 2023-07-12 ENCOUNTER — Other Ambulatory Visit: Payer: Self-pay | Admitting: Family Medicine

## 2023-07-12 DIAGNOSIS — E1169 Type 2 diabetes mellitus with other specified complication: Secondary | ICD-10-CM

## 2023-07-12 DIAGNOSIS — Z125 Encounter for screening for malignant neoplasm of prostate: Secondary | ICD-10-CM

## 2023-07-16 ENCOUNTER — Other Ambulatory Visit (INDEPENDENT_AMBULATORY_CARE_PROVIDER_SITE_OTHER): Payer: Medicare HMO

## 2023-07-16 DIAGNOSIS — E1169 Type 2 diabetes mellitus with other specified complication: Secondary | ICD-10-CM

## 2023-07-16 DIAGNOSIS — Z125 Encounter for screening for malignant neoplasm of prostate: Secondary | ICD-10-CM | POA: Diagnosis not present

## 2023-07-16 DIAGNOSIS — E785 Hyperlipidemia, unspecified: Secondary | ICD-10-CM

## 2023-07-16 LAB — COMPREHENSIVE METABOLIC PANEL WITH GFR
ALT: 19 U/L (ref 0–53)
AST: 23 U/L (ref 0–37)
Albumin: 4.3 g/dL (ref 3.5–5.2)
Alkaline Phosphatase: 78 U/L (ref 39–117)
BUN: 20 mg/dL (ref 6–23)
CO2: 30 meq/L (ref 19–32)
Calcium: 9.6 mg/dL (ref 8.4–10.5)
Chloride: 104 meq/L (ref 96–112)
Creatinine, Ser: 1.28 mg/dL (ref 0.40–1.50)
GFR: 57.77 mL/min — ABNORMAL LOW (ref >60.00)
Glucose, Bld: 112 mg/dL — ABNORMAL HIGH (ref 70–99)
Potassium: 4 meq/L (ref 3.5–5.1)
Sodium: 142 meq/L (ref 135–145)
Total Bilirubin: 1.2 mg/dL (ref 0.2–1.2)
Total Protein: 6.5 g/dL (ref 6.0–8.3)

## 2023-07-16 LAB — LIPID PANEL
Cholesterol: 125 mg/dL (ref 0–200)
HDL: 36.4 mg/dL — ABNORMAL LOW (ref >39.00)
LDL Cholesterol: 72 mg/dL (ref 0–99)
NonHDL: 88.82
Total CHOL/HDL Ratio: 3
Triglycerides: 84 mg/dL (ref 0.0–149.0)
VLDL: 16.8 mg/dL (ref 0.0–40.0)

## 2023-07-16 LAB — HEMOGLOBIN A1C: Hgb A1c MFr Bld: 6 % (ref 4.6–6.5)

## 2023-07-16 LAB — PSA: PSA: 2.53 ng/mL (ref 0.10–4.00)

## 2023-07-16 LAB — MICROALBUMIN / CREATININE URINE RATIO
Creatinine,U: 141.2 mg/dL
Microalb Creat Ratio: 15 mg/g (ref 0.0–30.0)
Microalb, Ur: 2.1 mg/dL — ABNORMAL HIGH (ref 0.0–1.9)

## 2023-07-22 LAB — VITAMIN B12: Vitamin B-12: 558 pg/mL (ref 211–911)

## 2023-07-23 ENCOUNTER — Encounter: Payer: Self-pay | Admitting: Family Medicine

## 2023-07-23 ENCOUNTER — Ambulatory Visit (INDEPENDENT_AMBULATORY_CARE_PROVIDER_SITE_OTHER): Payer: Medicare HMO | Admitting: Family Medicine

## 2023-07-23 VITALS — BP 124/74 | HR 71 | Temp 97.9°F | Ht 69.0 in | Wt 203.0 lb

## 2023-07-23 DIAGNOSIS — R194 Change in bowel habit: Secondary | ICD-10-CM | POA: Insufficient documentation

## 2023-07-23 DIAGNOSIS — E1169 Type 2 diabetes mellitus with other specified complication: Secondary | ICD-10-CM | POA: Diagnosis not present

## 2023-07-23 DIAGNOSIS — N529 Male erectile dysfunction, unspecified: Secondary | ICD-10-CM

## 2023-07-23 DIAGNOSIS — Z0000 Encounter for general adult medical examination without abnormal findings: Secondary | ICD-10-CM

## 2023-07-23 DIAGNOSIS — N289 Disorder of kidney and ureter, unspecified: Secondary | ICD-10-CM

## 2023-07-23 DIAGNOSIS — I10 Essential (primary) hypertension: Secondary | ICD-10-CM

## 2023-07-23 DIAGNOSIS — E785 Hyperlipidemia, unspecified: Secondary | ICD-10-CM | POA: Diagnosis not present

## 2023-07-23 DIAGNOSIS — Z7189 Other specified counseling: Secondary | ICD-10-CM

## 2023-07-23 DIAGNOSIS — R011 Cardiac murmur, unspecified: Secondary | ICD-10-CM | POA: Insufficient documentation

## 2023-07-23 DIAGNOSIS — Z0001 Encounter for general adult medical examination with abnormal findings: Secondary | ICD-10-CM | POA: Diagnosis not present

## 2023-07-23 DIAGNOSIS — K219 Gastro-esophageal reflux disease without esophagitis: Secondary | ICD-10-CM | POA: Diagnosis not present

## 2023-07-23 DIAGNOSIS — C099 Malignant neoplasm of tonsil, unspecified: Secondary | ICD-10-CM

## 2023-07-23 DIAGNOSIS — E66811 Obesity, class 1: Secondary | ICD-10-CM | POA: Diagnosis not present

## 2023-07-23 DIAGNOSIS — I77819 Aortic ectasia, unspecified site: Secondary | ICD-10-CM

## 2023-07-23 MED ORDER — ATORVASTATIN CALCIUM 40 MG PO TABS: 40.0000 mg | ORAL_TABLET | Freq: Every day | ORAL | 3 refills | Status: AC

## 2023-07-23 MED ORDER — LISINOPRIL 10 MG PO TABS
10.0000 mg | ORAL_TABLET | Freq: Every day | ORAL | 3 refills | Status: AC
Start: 2023-07-23 — End: ?

## 2023-07-23 MED ORDER — SILDENAFIL CITRATE 100 MG PO TABS
100.0000 mg | ORAL_TABLET | Freq: Every day | ORAL | 6 refills | Status: AC | PRN
Start: 2023-07-23 — End: ?

## 2023-07-23 NOTE — Assessment & Plan Note (Signed)
Advanced directive - does not have set up. Packet provided today. HCPOA would be [name]. Full code. Doesn't want prolonged life support if terminal condition.

## 2023-07-23 NOTE — Assessment & Plan Note (Addendum)
 New - without significant constipation. This has started since diet changes over the past 6 months. Rec start fiber supplementation. Already drinks plenty of water.

## 2023-07-23 NOTE — Assessment & Plan Note (Signed)
 Congratulated on weight loss to date. BMI 29.98.

## 2023-07-23 NOTE — Patient Instructions (Addendum)
 Advanced directive packet provided today Congratulations on healthy diet and lifestyle changes with resultant weight loss.  Ok to stop metformin . Goal fasting sugar 80-120. Restart metformin  if fasting sugars staying past this.  Start fiber pills or citrucel soluble fiber supplement powder with increased water.  Return in 6 months for diabetes follow up visit

## 2023-07-23 NOTE — Assessment & Plan Note (Signed)
 Chronic, stable on atorvastatin  with significant improvement in cholesterol control. Continue this. The ASCVD Risk score (Arnett DK, et al., 2019) failed to calculate for the following reasons:   The valid total cholesterol range is 130 to 320 mg/dL

## 2023-07-23 NOTE — Progress Notes (Addendum)
 Ph: [phone] Fax: [phone]   Patient ID: William Johns., male    DOB: 06-20-1955, 68 y.o.   MRN: [id number]  This visit was conducted in person.  BP 124/74   Pulse 71   Temp 97.9 F (36.6 C) (Oral)   Ht 5' 9 (1.753 m)   Wt 203 lb (92.1 kg)   SpO2 96%   BMI 29.98 kg/m    CC: CPE Subjective:   HPI: William Johns. is a 68 y.o. male presenting on 07/23/2023 for Annual Exam (MCR prt 2 [AWV- 07/06/23].)   Saw health advisor last month for medicare wellness visit. Note reviewed.   Discussed recently discovered [facility] laboratory miscalculation - the UACR calculation in the software of the system was incorrect but the absolute levels of microalbumin and creatinine were correct.   No results found.  Flowsheet Row Office Visit from 07/23/2023 in [facility] [place]  PHQ-2 Total Score 0       07/23/2023   10:30 AM 07/06/2023    3:49 PM 01/22/2023    2:49 PM 07/08/2021   11:25 AM  Fall Risk   Falls in the past year? 0 0 1 0  Number falls in past yr:  0 1   Injury with Fall?  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 Released from rad onc care ([facility]). Sees ENT Silvestre Drum) yearly. Ringing in bilateral ears since cancer treatment (chemo).   Lost 30 lbs through healthy diet changes.  Has been keeping sugar log twice daily - overall nonfasting am and pm 88-150s.  Bowels have dropped to 1-2x/wk. No constipaiton.   GERD controlled with daily low dose PPI 1/2 tab daily.  AAA US  11/2022 - 2.8cm dilation, rpt 2 yrs.   H/o ureteral stones with atrophic L kidney, saw Dr Estanislao Heimlich urology recently s/p shockwave lithotripsy 01/2021.   Preventative: COLONOSCOPY WITH PROPOFOL  07/28/2021 - HPs, poor prep, rpt next day Baldomero Bone, Elson Halon, MD)  COLONOSCOPY 07/29/2021 - rpt colonoscopy - 1 HP, int hem, rpt with 2d prep in 6 mo  (Vanga, Elson Halon, MD)  Declines repeat at this time- planning to repeat colonoscopy in 2025.  Prostate cancer screening - PSA yearly. Nocturia x1-2.  Lung cancer screening - ~30 PY hx. Quit 2009. fmhx lung cancer. Yearly CT lung screens, last 05/2021. No longer eligible  Flu shot yearly  COVID vaccine - Pfizer 04/2019, 05/2019, no booster Pnemovax 2014, Prevnar20 06/2021 Tdap 2013 RSV - discussed Shingrix - 09/2019, 02/2020  Advanced directive - does not have set up. Packet provided today. HCPOA would be William Johns. Full code. Doesn't want prolonged life support if terminal condition.  Seat belt use discussed.  Sunscreen use discussed. No changing moles on skin.  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 Interim medical history  since our last visit reviewed. Allergies and medications reviewed and updated. Outpatient Medications Prior to Visit  Medication Sig Dispense Refill   Accu-Chek Softclix Lancets lancets Use as instructed to check blood sugar 2 times a day 200 each 3   aspirin 81 MG tablet Take 81 mg by mouth daily.     Blood Glucose Monitoring Suppl DEVI 1 each by Does not apply route in the morning, at noon, and at bedtime. E11.69. May substitute to any manufacturer covered by patient's insurance. 1 each 0   glucose blood (ACCU-CHEK GUIDE TEST) test strip Use as instructed to check blood sugar 3 times a day 300 strip 3   ipratropium (ATROVENT) 0.06 % nasal spray Place into both nostrils.     methocarbamol  (ROBAXIN ) 500 MG  tablet Take 1 tablet (500 mg total) by mouth every 8 (eight) hours as needed for muscle spasms (sedation precations). 30 tablet 0   Multiple Vitamin (MULTIVITAMIN) tablet Take 1 tablet by mouth daily.     OMEGA-3 FATTY ACIDS PO Take 1 capsule by mouth daily.     omeprazole (PRILOSEC OTC) 20 MG tablet Take 20 mg by mouth daily. Alternates 1 tablet one day, then 1/2 tablet next day     atorvastatin  (LIPITOR) 40 MG tablet Take 1 tablet (40 mg total) by mouth daily. 90 tablet 4   lisinopril  (ZESTRIL ) 10 MG tablet Take 1 tablet (10 mg total) by mouth daily. 90 tablet 4   metFORMIN  (GLUCOPHAGE ) 500 MG tablet Take 1 tablet (500 mg total) by mouth daily with breakfast.     sildenafil  (VIAGRA ) 100 MG tablet Take 1 tablet (100 mg total) by mouth daily as needed for erectile dysfunction. 10 tablet 5   No facility-administered medications prior to visit.     Per HPI unless specifically indicated in ROS section below Review of Systems  Constitutional:  Negative for activity change, appetite change, chills, fatigue, fever and unexpected weight change.  HENT:  Negative for hearing loss.   Eyes:  Negative for visual disturbance.  Respiratory:  Negative for cough, chest tightness, shortness of breath and wheezing.   Cardiovascular:  Negative for chest pain, palpitations and leg swelling.  Gastrointestinal:  Negative for abdominal distention, abdominal pain, blood in stool, constipation, diarrhea, nausea and vomiting.  Genitourinary:  Negative for difficulty urinating and hematuria.  Musculoskeletal:  Negative for arthralgias, myalgias and neck pain.  Skin:  Negative for rash.  Neurological:  Negative for dizziness, seizures, syncope and headaches.  Hematological:  Negative for adenopathy. Does not bruise/bleed easily.  Psychiatric/Behavioral:  Negative for dysphoric mood. The patient is not nervous/anxious.     Objective:  BP 124/74   Pulse 71   Temp 97.9 F (36.6 C) (Oral)   Ht 5' 9 (1.753 m)   Wt  203 lb (92.1 kg)   SpO2 96%   BMI 29.98 kg/m   Wt Readings from Last 3 Encounters:  07/23/23 203 lb (92.1 kg)  07/06/23 230 lb (104.3 kg)  01/22/23 230 lb 2 oz (104.4 kg)      Physical Exam Vitals and nursing note reviewed.  Constitutional:      General: He is not in acute distress.    Appearance: Normal appearance. He is well-developed. He is not ill-appearing.  HENT:     Head: Normocephalic and atraumatic.     Right Ear: Hearing, tympanic membrane, ear canal and external ear normal.     Left Ear: Hearing, tympanic membrane, ear canal and external ear normal.  Mouth/Throat:     Mouth: Mucous membranes are moist.     Pharynx: Oropharynx is clear. No oropharyngeal exudate or posterior oropharyngeal erythema.   Eyes:     General: No scleral icterus.    Extraocular Movements: Extraocular movements intact.     Conjunctiva/sclera: Conjunctivae normal.     Pupils: Pupils are equal, round, and reactive to light.   Neck:     Thyroid : No thyroid  mass or thyromegaly.     Vascular: No carotid bruit.   Cardiovascular:     Rate and Rhythm: Normal rate and regular rhythm.     Pulses: Normal pulses.          Radial pulses are 2+ on the right side and 2+ on the left side.     Heart sounds: Murmur (2/6 systolic throughout) heard.  Pulmonary:     Effort: Pulmonary effort is normal. No respiratory distress.     Breath sounds: Normal breath sounds. No wheezing, rhonchi or rales.  Abdominal:     General: Bowel sounds are normal. There is no distension.     Palpations: Abdomen is soft. There is no mass.     Tenderness: There is no abdominal tenderness. There is no guarding or rebound.     Hernia: No hernia is present.   Musculoskeletal:        General: Normal range of motion.     Cervical back: Normal range of motion and neck supple.     Right lower leg: No edema.     Left lower leg: No edema.  Lymphadenopathy:     Cervical: No cervical adenopathy.   Skin:    General: Skin is  warm and dry.     Findings: No rash.   Neurological:     General: No focal deficit present.     Mental Status: He is alert and oriented to person, place, and time.   Psychiatric:        Mood and Affect: Mood normal.        Behavior: Behavior normal.        Thought Content: Thought content normal.        Judgment: Judgment normal.       Results for orders placed or performed in visit on 07/16/23  PSA   Collection Time: 07/16/23  7:31 AM  Result Value Ref Range   PSA 2.53 0.10 - 4.00 ng/mL  Vitamin B12   Collection Time: 07/16/23  7:31 AM  Result Value Ref Range   Vitamin B-12 558 211 - 911 pg/mL  Comprehensive metabolic panel with GFR   Collection Time: 07/16/23  7:31 AM  Result Value Ref Range   Sodium 142 135 - 145 mEq/L   Potassium 4.0 3.5 - 5.1 mEq/L   Chloride 104 96 - 112 mEq/L   CO2 30 19 - 32 mEq/L   Glucose, Bld 112 (H) 70 - 99 mg/dL   BUN 20 6 - 23 mg/dL   Creatinine, Ser 6.29 0.40 - 1.50 mg/dL   Total Bilirubin 1.2 0.2 - 1.2 mg/dL   Alkaline Phosphatase 78 39 - 117 U/L   AST 23 0 - 37 U/L   ALT 19 0 - 53 U/L   Total Protein 6.5 6.0 - 8.3 g/dL   Albumin 4.3 3.5 - 5.2 g/dL   GFR 52.84 (L) >13.24 mL/min   Calcium  9.6 8.4 - 10.5 mg/dL  Lipid panel   Collection Time: 07/16/23  7:31 AM  Result Value Ref Range   Cholesterol 125  0 - 200 mg/dL   Triglycerides 16.1 0.0 - 149.0 mg/dL   HDL 09.60 (L) >45.40 mg/dL   VLDL 98.1 0.0 - 19.1 mg/dL   LDL Cholesterol 72 0 - 99 mg/dL   Total CHOL/HDL Ratio 3    NonHDL 88.82   Microalbumin / creatinine urine ratio   Collection Time: 07/16/23  7:31 AM  Result Value Ref Range   Microalb, Ur 2.1 (H) 0.0 - 1.9 mg/dL   Creatinine,U 478.2 mg/dL   Microalb Creat Ratio 15.0 0.0 - 30.0 mg/g  Hemoglobin A1c   Collection Time: 07/16/23  7:31 AM  Result Value Ref Range   Hgb A1c MFr Bld 6.0 4.6 - 6.5 %    Assessment & Plan:   Problem List Items Addressed This Visit     Health maintenance examination - Primary (Chronic)    Preventative protocols reviewed and updated unless pt declined. Discussed healthy diet and lifestyle.       Advanced directives, counseling/discussion (Chronic)   Advanced directive - does not have set up. Packet provided today. HCPOA would be William Johns. Full code. Doesn't want prolonged life support if terminal condition.       HTN (hypertension)   Chronic, stable. Continue lisinopril .       Relevant Medications   atorvastatin  (LIPITOR) 40 MG tablet   lisinopril  (ZESTRIL ) 10 MG tablet   sildenafil  (VIAGRA ) 100 MG tablet   Dyslipidemia associated with type 2 diabetes mellitus (HCC)   Chronic, stable on atorvastatin  with significant improvement in cholesterol control. Continue this. The ASCVD Risk score (Arnett DK, et al., 2019) failed to calculate for the following reasons:   The valid total cholesterol range is 130 to 320 mg/dL       Relevant Medications   atorvastatin  (LIPITOR) 40 MG tablet   lisinopril  (ZESTRIL ) 10 MG tablet   ED (erectile dysfunction)   Relevant Medications   sildenafil  (VIAGRA ) 100 MG tablet   GERD (gastroesophageal reflux disease)   Stable on lowe dose omeprazole 10mg  daily.       Obesity, Class I, BMI 30-34.9   Congratulated on weight loss to date. BMI 29.98.       Type 2 diabetes mellitus with other specified complication (HCC)   Chronic, great control as evidenced by improved A1c. Significant diet changes have led to 30 lb weight loss - congratulated! He is motivated to continue this. Reasonable to trial off metformin .  He will continue monitoring blood pressures intermittently to ensure remains under excellent glycemic control.  RTC 6 mo DM f/u visit.       Relevant Medications   atorvastatin  (LIPITOR) 40 MG tablet   lisinopril  (ZESTRIL ) 10 MG tablet   Aortic dilatation (HCC)   Will need updated AAA US  11/2024.       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       Renal insufficiency   Mild. Continue to monitor. GFR high 50s      Systolic murmur   Mild, noted today, pt asxs.  Will monitor for now.       Decreased frequency of bowel movements   New - without significant constipation. This has started since diet changes over the past 6 months. Rec start fiber supplementation. Already drinks plenty of water.  Meds ordered this encounter  Medications   atorvastatin  (LIPITOR) 40 MG tablet    Sig: Take 1 tablet (40 mg total) by mouth daily.    Dispense:  90 tablet    Refill:  3   lisinopril  (ZESTRIL ) 10 MG tablet    Sig: Take 1 tablet (10 mg total) by mouth daily.    Dispense:  90 tablet    Refill:  3   sildenafil  (VIAGRA ) 100 MG tablet    Sig: Take 1 tablet (100 mg total) by mouth daily as needed for erectile dysfunction.    Dispense:  10 tablet    Refill:  6    No orders of the defined types were placed in this encounter.   Patient Instructions  Advanced directive packet provided today Congratulations on healthy diet and lifestyle changes with resultant weight loss.  Ok to stop metformin . Goal fasting sugar 80-120. Restart metformin  if fasting sugars staying past this.  Start fiber pills or citrucel soluble fiber supplement powder with increased water.  Return in 6 months for diabetes follow up visit   Follow up plan: Return in about 6 months (around 01/22/2024) for follow up visit.  Claire Crick, MD

## 2023-07-23 NOTE — Assessment & Plan Note (Signed)
 Stable on lowe dose omeprazole 10mg  daily.

## 2023-07-23 NOTE — Assessment & Plan Note (Signed)
 Preventative protocols reviewed and updated unless pt declined. Discussed healthy diet and lifestyle.

## 2023-07-23 NOTE — Assessment & Plan Note (Signed)
 History of this - cancer free x5 yrs.

## 2023-07-23 NOTE — Assessment & Plan Note (Signed)
 Mild. Continue to monitor. GFR high 50s

## 2023-07-23 NOTE — Assessment & Plan Note (Addendum)
 Mild, noted today, pt asxs.  Will monitor for now.

## 2023-07-23 NOTE — Assessment & Plan Note (Addendum)
Chronic, stable. Continue lisinopril.  

## 2023-07-23 NOTE — Assessment & Plan Note (Signed)
 Chronic, great control as evidenced by improved A1c. Significant diet changes have led to 30 lb weight loss - congratulated! He is motivated to continue this. Reasonable to trial off metformin .  He will continue monitoring blood pressures intermittently to ensure remains under excellent glycemic control.  RTC 6 mo DM f/u visit.

## 2023-07-23 NOTE — Assessment & Plan Note (Signed)
 Will need updated AAA US  11/2024.

## 2023-12-27 DIAGNOSIS — H524 Presbyopia: Secondary | ICD-10-CM | POA: Diagnosis not present

## 2023-12-27 DIAGNOSIS — H2513 Age-related nuclear cataract, bilateral: Secondary | ICD-10-CM | POA: Diagnosis not present

## 2023-12-27 DIAGNOSIS — H52221 Regular astigmatism, right eye: Secondary | ICD-10-CM | POA: Diagnosis not present

## 2023-12-27 DIAGNOSIS — E119 Type 2 diabetes mellitus without complications: Secondary | ICD-10-CM | POA: Diagnosis not present

## 2023-12-27 DIAGNOSIS — H5203 Hypermetropia, bilateral: Secondary | ICD-10-CM | POA: Diagnosis not present

## 2024-01-18 DIAGNOSIS — Z85818 Personal history of malignant neoplasm of other sites of lip, oral cavity, and pharynx: Secondary | ICD-10-CM | POA: Diagnosis not present

## 2024-01-18 DIAGNOSIS — J300 Vasomotor rhinitis: Secondary | ICD-10-CM | POA: Diagnosis not present

## 2024-01-21 ENCOUNTER — Encounter: Payer: Self-pay | Admitting: Family Medicine

## 2024-01-21 ENCOUNTER — Ambulatory Visit: Admitting: Family Medicine

## 2024-01-21 VITALS — BP 120/80 | HR 69 | Temp 98.2°F | Ht 69.0 in | Wt 215.0 lb

## 2024-01-21 DIAGNOSIS — R195 Other fecal abnormalities: Secondary | ICD-10-CM

## 2024-01-21 DIAGNOSIS — R011 Cardiac murmur, unspecified: Secondary | ICD-10-CM

## 2024-01-21 DIAGNOSIS — Z23 Encounter for immunization: Secondary | ICD-10-CM

## 2024-01-21 DIAGNOSIS — E1169 Type 2 diabetes mellitus with other specified complication: Secondary | ICD-10-CM | POA: Diagnosis not present

## 2024-01-21 LAB — POCT GLYCOSYLATED HEMOGLOBIN (HGB A1C): Hemoglobin A1C: 6.3 % — AB (ref 4.0–5.6)

## 2024-01-21 MED ORDER — METHOCARBAMOL 500 MG PO TABS: 500.0000 mg | ORAL_TABLET | Freq: Three times a day (TID) | ORAL | 0 refills | Status: AC | PRN

## 2024-01-21 NOTE — Patient Instructions (Addendum)
 Flu shot Continue dietary measures to control sugars - sugars are staying in prediabetes range.  Good to see you today Reschedule physical next year to after 07/22/2024.

## 2024-01-21 NOTE — Assessment & Plan Note (Signed)
 Chronic, great control, diet controlled. Weight gain noted discussed.  Continue to encourage healthy diet and lifestyle to maintain glycemic control.

## 2024-01-21 NOTE — Progress Notes (Signed)
 Ph: [phone] Fax: [phone]   Patient ID: William KANDICE Armida Mickey., male    DOB: [birth year], 68 y.o.   MRN: [id number]  This visit was conducted in person.  BP 120/80   Pulse 69   Temp 98.2 F (36.8 C) (Oral)   Ht 5' 9 (1.753 m)   Wt 215 lb (97.5 kg)   SpO2 95%   BMI 31.75 kg/m    CC: 6 mo f/u visit  Subjective:   HPI: William G Dileonardo Jr. is a 68 y.o. male presenting on 01/21/2024 for Medical Management of Chronic Issues (Follow up on DM, /No concerns//Average blood sugar is 135 according to pt, pt tries to limit sugars in diet)   DM - does regularly check sugars - averaging 135 - once every couple of weeks. Compliant with antihyperglycemic regimen which includes: diet controlled - metformin  500mg  stopped 07/2023 due to great sugar control and weight loss. Denies low sugars or hypoglycemic symptoms. Denies paresthesias, blurry vision. Last diabetic eye exam 12/2023. Glucometer brand: accuchek. Last foot exam: 01/2023 - DUE. DSME: 01/2012 at [facility].  Lab Results  Component Value Date   HGBA1C 6.3 (A) 01/21/2024   Diabetic Foot Exam - Simple   Simple Foot Form Diabetic Foot exam was performed with the following findings: Yes 01/21/2024  9:00 AM  Visual Inspection No deformities, no ulcerations, no other skin breakdown bilaterally: Yes Sensation Testing Intact to touch and monofilament testing bilaterally: Yes Pulse Check See comments: Yes Comments No claudication Diminished DP bilaterally Callus formation medial great toes bilaterally    Lab Results  Component Value Date   MICROALBUR 2.1 (H) 07/16/2023    H/o ureteral stones with atrophic L kidney, saw Dr Francisca urology s/p shockwave lithotripsy 01/2021.   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 Released from rad onc care ([facility]). Continues seeing ENT (Dr Herminio) yearly. Ringing in bilateral ears since cancer treatment (chemo).   COLONOSCOPY WITH PROPOFOL  07/28/2021 - HPs, poor prep, rpt  next day Caprice, Corinn Skiff, MD)  COLONOSCOPY 07/29/2021 - rpt colonoscopy - 1 HP, int hem, rpt with 2d prep in 6 mo (Vanga, Corinn Skiff, MD)  Declines repeat cologuard.  Agrees to discuss at next visit.      Relevant past medical, surgical, family and social history reviewed and updated as indicated. Interim medical history since our last visit reviewed. Allergies and medications reviewed and updated. Outpatient Medications Prior to Visit  Medication Sig Dispense Refill   Accu-Chek Softclix Lancets lancets Use as instructed to check blood sugar 2 times a day 200 each 3   aspirin 81 MG tablet Take 81 mg by mouth daily.     atorvastatin  (LIPITOR) 40 MG tablet Take 1 tablet (40 mg total) by mouth daily. 90 tablet 3   Blood Glucose Monitoring Suppl DEVI 1 each by Does not apply route in the morning, at noon, and at bedtime. E11.69. May substitute to any manufacturer covered by patient's insurance. 1 each 0   glucose blood (ACCU-CHEK GUIDE TEST) test strip Use as instructed to check blood sugar 3 times a day 300 strip 3   HYDROcodone -acetaminophen  (NORCO) 7.5-325 MG tablet Take 1 tablet by mouth every 6 (six) hours as needed.     ipratropium (ATROVENT) 0.06 % nasal spray Place into both nostrils.     lisinopril  (ZESTRIL ) 10 MG tablet Take 1 tablet (10 mg total) by mouth daily. 90 tablet 3   Multiple Vitamin (MULTIVITAMIN) tablet Take 1 tablet by mouth daily.  OMEGA-3 FATTY ACIDS PO Take 1 capsule by mouth daily.     sildenafil  (VIAGRA ) 100 MG tablet Take 1 tablet (100 mg total) by mouth daily as needed for erectile dysfunction. 10 tablet 6   omeprazole (PRILOSEC OTC) 20 MG tablet Take 20 mg by mouth daily. Alternates 1 tablet one day, then 1/2 tablet next day     omeprazole (PRILOSEC OTC) 20 MG tablet Take 1 tablet (20 mg total) by mouth daily. Takes 1/2 tablet daily     methocarbamol  (ROBAXIN ) 500 MG tablet Take 1 tablet (500 mg total) by mouth every 8 (eight) hours as needed for muscle  spasms (sedation precations). 30 tablet 0   No facility-administered medications prior to visit.     Per HPI unless specifically indicated in ROS section below Review of Systems  Objective:  BP 120/80   Pulse 69   Temp 98.2 F (36.8 C) (Oral)   Ht 5' 9 (1.753 m)   Wt 215 lb (97.5 kg)   SpO2 95%   BMI 31.75 kg/m   Wt Readings from Last 3 Encounters:  01/21/24 215 lb (97.5 kg)  07/23/23 203 lb (92.1 kg)  07/06/23 230 lb (104.3 kg)      Physical Exam Vitals and nursing note reviewed.  Constitutional:      Appearance: Normal appearance. He is not ill-appearing.  HENT:     Head: Normocephalic and atraumatic.     Mouth/Throat:     Mouth: Mucous membranes are moist.     Pharynx: Oropharynx is clear. No oropharyngeal exudate or posterior oropharyngeal erythema.  Eyes:     Extraocular Movements: Extraocular movements intact.     Conjunctiva/sclera: Conjunctivae normal.     Pupils: Pupils are equal, round, and reactive to light.  Cardiovascular:     Rate and Rhythm: Normal rate and regular rhythm.     Pulses: Normal pulses.     Heart sounds: Murmur (2/6 systolic USB) heard.  Pulmonary:     Effort: Pulmonary effort is normal. No respiratory distress.     Breath sounds: Normal breath sounds. No wheezing, rhonchi or rales.  Musculoskeletal:     Cervical back: Normal range of motion and neck supple.     Right lower leg: No edema.     Left lower leg: No edema.     Comments: See HPI for foot exam if done  Skin:    General: Skin is warm and dry.     Findings: No rash.  Neurological:     Mental Status: He is alert.  Psychiatric:        Mood and Affect: Mood normal.        Behavior: Behavior normal.       Results for orders placed or performed in visit on 01/21/24  HgB A1c   Collection Time: 01/21/24  8:47 AM  Result Value Ref Range   Hemoglobin A1C 6.3 (A) 4.0 - 5.6 %   HbA1c POC (<> result, manual entry)     HbA1c, POC (prediabetic range)     HbA1c, POC (controlled  diabetic range)      Assessment & Plan:   Problem List Items Addressed This Visit     Type 2 diabetes mellitus with other specified complication (HCC) - Primary   Chronic, great control, diet controlled. Weight gain noted discussed.  Continue to encourage healthy diet and lifestyle to maintain glycemic control.      Relevant Orders   HgB A1c (Completed)   Positive colorectal cancer screening using  Cologuard test   Discussed repeat colonoscopy - he declines, as well as rpt cologuard. Agrees to readdress next year.       Systolic murmur   Mild. Will continue to monitor.       Other Visit Diagnoses       Encounter for immunization       Relevant Orders   Flu vaccine HIGH DOSE PF(Fluzone Trivalent) (Completed)        Meds ordered this encounter  Medications   methocarbamol  (ROBAXIN ) 500 MG tablet    Sig: Take 1 tablet (500 mg total) by mouth every 8 (eight) hours as needed for muscle spasms (sedation precations).    Dispense:  30 tablet    Refill:  0    Orders Placed This Encounter  Procedures   Flu vaccine HIGH DOSE PF(Fluzone Trivalent)   HgB A1c    Patient Instructions  Flu shot Continue dietary measures to control sugars - sugars are staying in prediabetes range.  Good to see you today Reschedule physical next year to after 07/22/2024.   Follow up plan: Return in about 6 months (around 07/21/2024) for annual exam, prior fasting for blood work, medicare wellness visit.  Anton Blas, MD

## 2024-01-21 NOTE — Assessment & Plan Note (Signed)
 Mild. Will continue to monitor.

## 2024-01-21 NOTE — Assessment & Plan Note (Addendum)
 Discussed repeat colonoscopy - he declines, as well as rpt cologuard. Agrees to readdress next year.

## 2024-03-01 ENCOUNTER — Other Ambulatory Visit: Payer: Self-pay | Admitting: Family Medicine

## 2024-03-01 DIAGNOSIS — E1169 Type 2 diabetes mellitus with other specified complication: Secondary | ICD-10-CM

## 2024-06-26 ENCOUNTER — Ambulatory Visit

## 2024-06-30 ENCOUNTER — Other Ambulatory Visit

## 2024-07-06 ENCOUNTER — Ambulatory Visit

## 2024-07-07 ENCOUNTER — Encounter: Admitting: Family Medicine

## 2025-01-16 ENCOUNTER — Other Ambulatory Visit

## 2025-01-23 ENCOUNTER — Encounter: Admitting: Family Medicine
